# Patient Record
Sex: Female | Born: 1960 | Race: White | Hispanic: No | Marital: Married | Smoking: Former smoker
Health system: Southern US, Community
[De-identification: ages and names within clinical notes are randomized; demographics above are authoritative.]

## PROBLEM LIST (undated history)

## (undated) DIAGNOSIS — G4733 Obstructive sleep apnea (adult) (pediatric): Secondary | ICD-10-CM

## (undated) DIAGNOSIS — K219 Gastro-esophageal reflux disease without esophagitis: Secondary | ICD-10-CM

## (undated) DIAGNOSIS — M4800 Spinal stenosis, site unspecified: Secondary | ICD-10-CM

## (undated) DIAGNOSIS — N200 Calculus of kidney: Secondary | ICD-10-CM

## (undated) DIAGNOSIS — M47812 Spondylosis without myelopathy or radiculopathy, cervical region: Secondary | ICD-10-CM

## (undated) DIAGNOSIS — L4050 Arthropathic psoriasis, unspecified: Secondary | ICD-10-CM

## (undated) DIAGNOSIS — R112 Nausea with vomiting, unspecified: Secondary | ICD-10-CM

## (undated) DIAGNOSIS — F329 Major depressive disorder, single episode, unspecified: Secondary | ICD-10-CM

## (undated) DIAGNOSIS — F32A Depression, unspecified: Secondary | ICD-10-CM

## (undated) DIAGNOSIS — E785 Hyperlipidemia, unspecified: Secondary | ICD-10-CM

## (undated) DIAGNOSIS — D45 Polycythemia vera: Secondary | ICD-10-CM

## (undated) DIAGNOSIS — R519 Headache, unspecified: Secondary | ICD-10-CM

## (undated) DIAGNOSIS — F419 Anxiety disorder, unspecified: Secondary | ICD-10-CM

## (undated) DIAGNOSIS — K589 Irritable bowel syndrome without diarrhea: Secondary | ICD-10-CM

## (undated) DIAGNOSIS — N201 Calculus of ureter: Secondary | ICD-10-CM

## (undated) DIAGNOSIS — Z9889 Other specified postprocedural states: Secondary | ICD-10-CM

## (undated) DIAGNOSIS — R51 Headache: Secondary | ICD-10-CM

## (undated) DIAGNOSIS — Z87442 Personal history of urinary calculi: Secondary | ICD-10-CM

## (undated) DIAGNOSIS — M1990 Unspecified osteoarthritis, unspecified site: Secondary | ICD-10-CM

## (undated) HISTORY — PX: ESOPHAGOGASTRODUODENOSCOPY: SHX1529

## (undated) HISTORY — PX: ABLATION: SHX5711

## (undated) HISTORY — PX: COLONOSCOPY: SHX174

## (undated) HISTORY — PX: CHOLECYSTECTOMY: SHX55

## (undated) HISTORY — PX: OTHER SURGICAL HISTORY: SHX169

## (undated) HISTORY — PX: WISDOM TOOTH EXTRACTION: SHX21

## (undated) HISTORY — DX: Irritable bowel syndrome, unspecified: K58.9

## (undated) HISTORY — DX: Headache, unspecified: R51.9

## (undated) HISTORY — DX: Headache: R51

---

## 2008-06-27 ENCOUNTER — Encounter: Admission: RE | Admit: 2008-06-27 | Discharge: 2008-06-27 | Payer: Self-pay | Admitting: Internal Medicine

## 2008-07-02 ENCOUNTER — Encounter: Admission: RE | Admit: 2008-07-02 | Discharge: 2008-07-02 | Payer: Self-pay | Admitting: Internal Medicine

## 2009-08-18 ENCOUNTER — Encounter: Admission: RE | Admit: 2009-08-18 | Discharge: 2009-08-18 | Payer: Self-pay | Admitting: Unknown Physician Specialty

## 2010-02-10 ENCOUNTER — Emergency Department (HOSPITAL_COMMUNITY)
Admission: EM | Admit: 2010-02-10 | Discharge: 2010-02-10 | Payer: Self-pay | Source: Home / Self Care | Admitting: Emergency Medicine

## 2010-02-14 ENCOUNTER — Ambulatory Visit (HOSPITAL_COMMUNITY)
Admission: RE | Admit: 2010-02-14 | Discharge: 2010-02-14 | Payer: Self-pay | Source: Home / Self Care | Admitting: Urology

## 2010-02-14 HISTORY — PX: EXTRACORPOREAL SHOCK WAVE LITHOTRIPSY: SHX1557

## 2010-04-04 ENCOUNTER — Encounter (HOSPITAL_COMMUNITY): Payer: Self-pay | Admitting: Obstetrics and Gynecology

## 2010-05-24 LAB — URINALYSIS, ROUTINE W REFLEX MICROSCOPIC
Bilirubin Urine: NEGATIVE
Glucose, UA: NEGATIVE mg/dL
Ketones, ur: NEGATIVE mg/dL
Leukocytes, UA: NEGATIVE
Nitrite: NEGATIVE
Protein, ur: NEGATIVE mg/dL
Specific Gravity, Urine: 1.015 (ref 1.005–1.030)
Urobilinogen, UA: 0.2 mg/dL (ref 0.0–1.0)
pH: 7 (ref 5.0–8.0)

## 2010-05-24 LAB — URINE MICROSCOPIC-ADD ON

## 2010-05-24 LAB — DIFFERENTIAL
Basophils Absolute: 0.1 10*3/uL (ref 0.0–0.1)
Basophils Relative: 1 % (ref 0–1)
Eosinophils Absolute: 0.2 10*3/uL (ref 0.0–0.7)
Eosinophils Relative: 2 % (ref 0–5)
Lymphocytes Relative: 25 % (ref 12–46)
Lymphs Abs: 3.2 10*3/uL (ref 0.7–4.0)
Monocytes Absolute: 0.9 10*3/uL (ref 0.1–1.0)
Monocytes Relative: 7 % (ref 3–12)
Neutro Abs: 8.5 10*3/uL — ABNORMAL HIGH (ref 1.7–7.7)
Neutrophils Relative %: 66 % (ref 43–77)

## 2010-05-24 LAB — POCT I-STAT, CHEM 8
BUN: 16 mg/dL (ref 6–23)
Calcium, Ion: 1.18 mmol/L (ref 1.12–1.32)
Chloride: 103 mEq/L (ref 96–112)
Creatinine, Ser: 1 mg/dL (ref 0.4–1.2)
Glucose, Bld: 104 mg/dL — ABNORMAL HIGH (ref 70–99)
HCT: 44 % (ref 36.0–46.0)
Hemoglobin: 15 g/dL (ref 12.0–15.0)
Potassium: 3.7 mEq/L (ref 3.5–5.1)
Sodium: 139 mEq/L (ref 135–145)
TCO2: 27 mmol/L (ref 0–100)

## 2010-05-24 LAB — CBC
HCT: 42.7 % (ref 36.0–46.0)
Hemoglobin: 15 g/dL (ref 12.0–15.0)
MCH: 31.3 pg (ref 26.0–34.0)
MCHC: 35.1 g/dL (ref 30.0–36.0)
MCV: 89.1 fL (ref 78.0–100.0)
Platelets: 253 10*3/uL (ref 150–400)
RBC: 4.79 MIL/uL (ref 3.87–5.11)
RDW: 12.9 % (ref 11.5–15.5)
WBC: 12.9 10*3/uL — ABNORMAL HIGH (ref 4.0–10.5)

## 2010-05-24 LAB — POCT PREGNANCY, URINE: Preg Test, Ur: NEGATIVE

## 2010-10-16 ENCOUNTER — Emergency Department (HOSPITAL_COMMUNITY)
Admission: EM | Admit: 2010-10-16 | Discharge: 2010-10-16 | Disposition: A | Discharge status: Home / Self Care | Payer: 59 | Attending: Emergency Medicine | Admitting: Emergency Medicine

## 2010-10-16 DIAGNOSIS — M542 Cervicalgia: Secondary | ICD-10-CM | POA: Insufficient documentation

## 2010-10-16 DIAGNOSIS — K219 Gastro-esophageal reflux disease without esophagitis: Secondary | ICD-10-CM | POA: Insufficient documentation

## 2010-10-16 DIAGNOSIS — G8929 Other chronic pain: Secondary | ICD-10-CM | POA: Insufficient documentation

## 2010-10-16 DIAGNOSIS — R1110 Vomiting, unspecified: Secondary | ICD-10-CM | POA: Insufficient documentation

## 2010-10-16 DIAGNOSIS — M129 Arthropathy, unspecified: Secondary | ICD-10-CM | POA: Insufficient documentation

## 2010-10-16 DIAGNOSIS — M545 Low back pain, unspecified: Secondary | ICD-10-CM | POA: Insufficient documentation

## 2010-10-16 DIAGNOSIS — R51 Headache: Secondary | ICD-10-CM | POA: Insufficient documentation

## 2010-10-16 DIAGNOSIS — Z79899 Other long term (current) drug therapy: Secondary | ICD-10-CM | POA: Insufficient documentation

## 2010-10-16 DIAGNOSIS — F341 Dysthymic disorder: Secondary | ICD-10-CM | POA: Insufficient documentation

## 2010-10-16 LAB — URINALYSIS, ROUTINE W REFLEX MICROSCOPIC
Bilirubin Urine: NEGATIVE
Glucose, UA: NEGATIVE mg/dL
Hgb urine dipstick: NEGATIVE
Ketones, ur: NEGATIVE mg/dL
Leukocytes, UA: NEGATIVE
Nitrite: NEGATIVE
Protein, ur: NEGATIVE mg/dL
Specific Gravity, Urine: 1.01 (ref 1.005–1.030)
Urobilinogen, UA: 0.2 mg/dL (ref 0.0–1.0)
pH: 7.5 (ref 5.0–8.0)

## 2010-10-16 LAB — COMPREHENSIVE METABOLIC PANEL
ALT: 17 U/L (ref 0–35)
AST: 19 U/L (ref 0–37)
Albumin: 3.5 g/dL (ref 3.5–5.2)
Alkaline Phosphatase: 77 U/L (ref 39–117)
BUN: 11 mg/dL (ref 6–23)
CO2: 27 mEq/L (ref 19–32)
Calcium: 9.4 mg/dL (ref 8.4–10.5)
Chloride: 105 mEq/L (ref 96–112)
Creatinine, Ser: 0.54 mg/dL (ref 0.50–1.10)
GFR calc Af Amer: >60 mL/min (ref >60)
GFR calc non Af Amer: >60 mL/min (ref >60)
Glucose, Bld: 110 mg/dL — ABNORMAL HIGH (ref 70–99)
Potassium: 4.2 mEq/L (ref 3.5–5.1)
Sodium: 139 mEq/L (ref 135–145)
Total Bilirubin: 0.3 mg/dL (ref 0.3–1.2)
Total Protein: 7.8 g/dL (ref 6.0–8.3)

## 2010-10-16 LAB — CBC
HCT: 43 % (ref 36.0–46.0)
Hemoglobin: 15.4 g/dL — ABNORMAL HIGH (ref 12.0–15.0)
MCH: 31.3 pg (ref 26.0–34.0)
MCHC: 35.8 g/dL (ref 30.0–36.0)
MCV: 87.4 fL (ref 78.0–100.0)
Platelets: 399 10*3/uL (ref 150–400)
RBC: 4.92 MIL/uL (ref 3.87–5.11)
RDW: 12.2 % (ref 11.5–15.5)
WBC: 9.6 10*3/uL (ref 4.0–10.5)

## 2010-10-16 LAB — DIFFERENTIAL
Basophils Absolute: 0 10*3/uL (ref 0.0–0.1)
Basophils Relative: 0 % (ref 0–1)
Eosinophils Absolute: 0 10*3/uL (ref 0.0–0.7)
Eosinophils Relative: 0 % (ref 0–5)
Lymphocytes Relative: 14 % (ref 12–46)
Lymphs Abs: 1.4 10*3/uL (ref 0.7–4.0)
Monocytes Absolute: 0.3 10*3/uL (ref 0.1–1.0)
Monocytes Relative: 3 % (ref 3–12)
Neutro Abs: 7.9 10*3/uL — ABNORMAL HIGH (ref 1.7–7.7)
Neutrophils Relative %: 83 % — ABNORMAL HIGH (ref 43–77)

## 2010-10-16 LAB — POCT PREGNANCY, URINE: Preg Test, Ur: NEGATIVE

## 2012-06-05 ENCOUNTER — Other Ambulatory Visit: Payer: Self-pay | Admitting: *Deleted

## 2012-06-05 DIAGNOSIS — G4733 Obstructive sleep apnea (adult) (pediatric): Secondary | ICD-10-CM

## 2012-06-24 ENCOUNTER — Ambulatory Visit (INDEPENDENT_AMBULATORY_CARE_PROVIDER_SITE_OTHER): Payer: 59 | Admitting: Neurology

## 2012-06-24 DIAGNOSIS — R0609 Other forms of dyspnea: Secondary | ICD-10-CM

## 2012-06-24 DIAGNOSIS — R0989 Other specified symptoms and signs involving the circulatory and respiratory systems: Secondary | ICD-10-CM

## 2012-06-24 DIAGNOSIS — G4710 Hypersomnia, unspecified: Secondary | ICD-10-CM

## 2012-06-24 DIAGNOSIS — G4733 Obstructive sleep apnea (adult) (pediatric): Secondary | ICD-10-CM

## 2012-07-09 ENCOUNTER — Other Ambulatory Visit: Payer: Self-pay | Admitting: *Deleted

## 2012-07-09 ENCOUNTER — Telehealth: Payer: Self-pay | Admitting: *Deleted

## 2012-07-09 DIAGNOSIS — G4733 Obstructive sleep apnea (adult) (pediatric): Secondary | ICD-10-CM

## 2012-07-09 NOTE — Telephone Encounter (Signed)
Tried to contact pt to go over sleep study results.  Home phone said (all circuits busy) and then call could not be completed as dialed,  Tried mobile number and also received a call could not be completed as dialed message. -sh

## 2012-11-04 ENCOUNTER — Encounter (HOSPITAL_COMMUNITY): Payer: Self-pay | Admitting: Obstetrics and Gynecology

## 2012-11-12 ENCOUNTER — Encounter (HOSPITAL_COMMUNITY): Payer: Self-pay | Admitting: Pharmacist

## 2012-11-26 MED ORDER — DEXTROSE 5 % IV SOLN
2.0000 g | INTRAVENOUS | Status: AC
  Administered 2012-11-27: 2 g via INTRAVENOUS
  Filled 2012-11-26: qty 2

## 2012-11-26 MED ORDER — DEXTROSE 5 % IV SOLN
2.0000 g | INTRAVENOUS | Status: DC
  Filled 2012-11-26: qty 2

## 2012-11-26 NOTE — H&P (Addendum)
52 yo G2P2 with PMB and cervical stenosis presents for diagnostic procedure  PMHx:  GERD, IBS, arthritis, anxiety,  PSHx:  c-section x 2, chole, lithotripsy All:  None Meds:  Prilosec, celexa, cymbalta, vitamins, clonazepam SHx:  Negative tobacco  Af, vss Gen - NAD abd - soft, NT CV - RRR Lungs - clear Ext - NT PV - uterus mobile NT  [place]:  Fluid in endometrial canal, no adnexal masses  A/P:  PMB, unable to perform office EMBx because of cervical stenosis Hysteroscopy, D&C No change to plan of care - reviewed with pt and husband Informed consent

## 2012-11-27 ENCOUNTER — Ambulatory Visit (HOSPITAL_COMMUNITY)
Admission: RE | Admit: 2012-11-27 | Discharge: 2012-11-27 | Disposition: A | Discharge status: Home / Self Care | Payer: 59 | Source: Ambulatory Visit | Attending: Obstetrics and Gynecology | Admitting: Obstetrics and Gynecology

## 2012-11-27 ENCOUNTER — Encounter (HOSPITAL_COMMUNITY): Payer: Self-pay | Admitting: *Deleted

## 2012-11-27 ENCOUNTER — Encounter (HOSPITAL_COMMUNITY): Payer: Self-pay | Admitting: Anesthesiology

## 2012-11-27 ENCOUNTER — Encounter (HOSPITAL_COMMUNITY)
Admission: RE | Disposition: A | Discharge status: Home / Self Care | Payer: Self-pay | Source: Ambulatory Visit | Attending: Obstetrics and Gynecology

## 2012-11-27 ENCOUNTER — Ambulatory Visit (HOSPITAL_COMMUNITY): Payer: 59 | Admitting: Anesthesiology

## 2012-11-27 DIAGNOSIS — N882 Stricture and stenosis of cervix uteri: Secondary | ICD-10-CM | POA: Insufficient documentation

## 2012-11-27 DIAGNOSIS — N857 Hematometra: Secondary | ICD-10-CM | POA: Insufficient documentation

## 2012-11-27 DIAGNOSIS — N950 Postmenopausal bleeding: Secondary | ICD-10-CM | POA: Insufficient documentation

## 2012-11-27 HISTORY — DX: Nausea with vomiting, unspecified: R11.2

## 2012-11-27 HISTORY — DX: Other specified postprocedural states: Z98.890

## 2012-11-27 HISTORY — PX: HYSTEROSCOPY WITH D & C: SHX1775

## 2012-11-27 HISTORY — DX: Depression, unspecified: F32.A

## 2012-11-27 HISTORY — DX: Major depressive disorder, single episode, unspecified: F32.9

## 2012-11-27 HISTORY — DX: Unspecified osteoarthritis, unspecified site: M19.90

## 2012-11-27 HISTORY — DX: Anxiety disorder, unspecified: F41.9

## 2012-11-27 LAB — CBC
HCT: 44.1 % (ref 36.0–46.0)
Hemoglobin: 15.1 g/dL — ABNORMAL HIGH (ref 12.0–15.0)
MCH: 30.4 pg (ref 26.0–34.0)
MCHC: 34.2 g/dL (ref 30.0–36.0)
MCV: 88.9 fL (ref 78.0–100.0)
Platelets: 265 10*3/uL (ref 150–400)
RBC: 4.96 MIL/uL (ref 3.87–5.11)
RDW: 12.9 % (ref 11.5–15.5)
WBC: 7.7 10*3/uL (ref 4.0–10.5)

## 2012-11-27 SURGERY — DILATATION AND CURETTAGE /HYSTEROSCOPY
Anesthesia: General | Site: Vagina | Wound class: Clean Contaminated

## 2012-11-27 MED ORDER — PROPOFOL 10 MG/ML IV BOLUS
INTRAVENOUS | Status: DC | PRN
  Administered 2012-11-27: 180 mg via INTRAVENOUS

## 2012-11-27 MED ORDER — LIDOCAINE HCL 1 % IJ SOLN
INTRAMUSCULAR | Status: DC | PRN
  Administered 2012-11-27: 9 mL
  Administered 2012-11-27: 1 mL

## 2012-11-27 MED ORDER — LIDOCAINE HCL (CARDIAC) 20 MG/ML IV SOLN
INTRAVENOUS | Status: AC
  Filled 2012-11-27: qty 5

## 2012-11-27 MED ORDER — MIDAZOLAM HCL 2 MG/2ML IJ SOLN: 0.5000 mg | Freq: Once | INTRAMUSCULAR | Status: DC | PRN

## 2012-11-27 MED ORDER — SCOPOLAMINE 1 MG/3DAYS TD PT72
MEDICATED_PATCH | TRANSDERMAL | Status: AC
  Filled 2012-11-27: qty 1

## 2012-11-27 MED ORDER — PROPOFOL 10 MG/ML IV EMUL
INTRAVENOUS | Status: AC
  Filled 2012-11-27: qty 20

## 2012-11-27 MED ORDER — LIDOCAINE HCL (CARDIAC) 20 MG/ML IV SOLN
INTRAVENOUS | Status: DC | PRN
  Administered 2012-11-27: 50 mg via INTRAVENOUS

## 2012-11-27 MED ORDER — FENTANYL CITRATE 0.05 MG/ML IJ SOLN
INTRAMUSCULAR | Status: DC | PRN
  Administered 2012-11-27: 100 ug via INTRAVENOUS

## 2012-11-27 MED ORDER — ONDANSETRON HCL 4 MG/2ML IJ SOLN
INTRAMUSCULAR | Status: DC | PRN
  Administered 2012-11-27: 4 mg via INTRAVENOUS

## 2012-11-27 MED ORDER — ONDANSETRON HCL 4 MG/2ML IJ SOLN
INTRAMUSCULAR | Status: AC
  Filled 2012-11-27: qty 2

## 2012-11-27 MED ORDER — MIDAZOLAM HCL 2 MG/2ML IJ SOLN
INTRAMUSCULAR | Status: AC
  Filled 2012-11-27: qty 2

## 2012-11-27 MED ORDER — DEXAMETHASONE SODIUM PHOSPHATE 10 MG/ML IJ SOLN
INTRAMUSCULAR | Status: DC | PRN
  Administered 2012-11-27: 10 mg via INTRAVENOUS

## 2012-11-27 MED ORDER — MEPERIDINE HCL 25 MG/ML IJ SOLN: 6.2500 mg | INTRAMUSCULAR | Status: DC | PRN

## 2012-11-27 MED ORDER — LACTATED RINGERS IV SOLN
INTRAVENOUS | Status: DC
  Administered 2012-11-27 (×3): via INTRAVENOUS

## 2012-11-27 MED ORDER — PANTOPRAZOLE SODIUM 40 MG PO TBEC
40.0000 mg | DELAYED_RELEASE_TABLET | Freq: Every day | ORAL | Status: DC
  Administered 2012-11-27: 40 mg via ORAL

## 2012-11-27 MED ORDER — PROMETHAZINE HCL 25 MG/ML IJ SOLN: 6.2500 mg | INTRAMUSCULAR | Status: DC | PRN

## 2012-11-27 MED ORDER — KETOROLAC TROMETHAMINE 30 MG/ML IJ SOLN: 15.0000 mg | Freq: Once | INTRAMUSCULAR | Status: DC | PRN

## 2012-11-27 MED ORDER — FENTANYL CITRATE 0.05 MG/ML IJ SOLN
INTRAMUSCULAR | Status: AC
  Filled 2012-11-27: qty 2

## 2012-11-27 MED ORDER — SCOPOLAMINE 1 MG/3DAYS TD PT72
1.0000 | MEDICATED_PATCH | TRANSDERMAL | Status: DC
  Administered 2012-11-27: 1.5 mg via TRANSDERMAL

## 2012-11-27 MED ORDER — PANTOPRAZOLE SODIUM 40 MG PO TBEC
DELAYED_RELEASE_TABLET | ORAL | Status: AC
  Administered 2012-11-27: 40 mg via ORAL
  Filled 2012-11-27: qty 1

## 2012-11-27 MED ORDER — FENTANYL CITRATE 0.05 MG/ML IJ SOLN
25.0000 ug | INTRAMUSCULAR | Status: DC | PRN
  Administered 2012-11-27: 50 ug via INTRAVENOUS

## 2012-11-27 MED ORDER — MIDAZOLAM HCL 2 MG/2ML IJ SOLN
INTRAMUSCULAR | Status: DC | PRN
  Administered 2012-11-27: 2 mg via INTRAVENOUS

## 2012-11-27 MED ORDER — GLYCINE 1.5 % IR SOLN
Status: DC | PRN
  Administered 2012-11-27 (×2): 3000 mL

## 2012-11-27 MED ORDER — KETOROLAC TROMETHAMINE 30 MG/ML IJ SOLN
INTRAMUSCULAR | Status: DC | PRN
  Administered 2012-11-27: 30 mg via INTRAVENOUS

## 2012-11-27 MED ORDER — FENTANYL CITRATE 0.05 MG/ML IJ SOLN
INTRAMUSCULAR | Status: AC
  Administered 2012-11-27: 50 ug via INTRAVENOUS
  Filled 2012-11-27: qty 2

## 2012-11-27 SURGICAL SUPPLY — 19 items
ABLATOR ENDOMETRIAL BIPOLAR (ABLATOR) IMPLANT
CANISTER SUCTION 2500CC (MISCELLANEOUS) ×2 IMPLANT
CATH ROBINSON RED A/P 16FR (CATHETERS) ×2 IMPLANT
CATH THERMACHOICE III (CATHETERS) IMPLANT
CLOTH BEACON ORANGE TIMEOUT ST (SAFETY) ×2 IMPLANT
CONTAINER PREFILL 10% NBF 60ML (FORM) ×2 IMPLANT
DILATOR CANAL MILEX (MISCELLANEOUS) ×2 IMPLANT
DRESSING TELFA 8X3 (GAUZE/BANDAGES/DRESSINGS) ×2 IMPLANT
ELECT REM PT RETURN 9FT ADLT (ELECTROSURGICAL) ×2
ELECTRODE REM PT RTRN 9FT ADLT (ELECTROSURGICAL) ×1 IMPLANT
GLOVE BIO SURGEON STRL SZ 6.5 (GLOVE) ×2 IMPLANT
GLOVE BIOGEL PI IND STRL 7.0 (GLOVE) ×1 IMPLANT
GLOVE BIOGEL PI INDICATOR 7.0 (GLOVE) ×1
GOWN STRL REIN XL XLG (GOWN DISPOSABLE) ×4 IMPLANT
LOOP ANGLED CUTTING 22FR (CUTTING LOOP) IMPLANT
PACK HYSTEROSCOPY LF (CUSTOM PROCEDURE TRAY) ×2 IMPLANT
PAD OB MATERNITY 4.3X12.25 (PERSONAL CARE ITEMS) ×2 IMPLANT
TOWEL OR 17X24 6PK STRL BLUE (TOWEL DISPOSABLE) ×4 IMPLANT
WATER STERILE IRR 1000ML POUR (IV SOLUTION) ×2 IMPLANT

## 2012-11-27 NOTE — Anesthesia Preprocedure Evaluation (Signed)
Anesthesia Evaluation  Patient identified by MRN, date of birth, ID band Patient awake    Reviewed: Allergy & Precautions, H&P , Patient's Chart, lab work & pertinent test results, reviewed documented beta blocker date and time   History of Anesthesia Complications (+) PONV  Airway Mallampati: II TM Distance: >3 FB Neck ROM: full    Dental no notable dental hx.    Pulmonary neg pulmonary ROS, sleep apnea ,  breath sounds clear to auscultation  Pulmonary exam normal       Cardiovascular Exercise Tolerance: Good negative cardio ROS  Rhythm:regular Rate:Normal     Neuro/Psych PSYCHIATRIC DISORDERS Anxiety Depression negative neurological ROS  negative psych ROS   GI/Hepatic negative GI ROS, Neg liver ROS,   Endo/Other  negative endocrine ROS  Renal/GU Renal diseasenegative Renal ROS     Musculoskeletal   Abdominal   Peds  Hematology negative hematology ROS (+)   Anesthesia Other Findings Kidney stones arthritis  Reproductive/Obstetrics negative OB ROS                           Anesthesia Physical Anesthesia Plan  ASA: II  Anesthesia Plan: General LMA   Post-op Pain Management:    Induction:   Airway Management Planned:   Additional Equipment:   Intra-op Plan:   Post-operative Plan:   Informed Consent: I have reviewed the patients History and Physical, chart, labs and discussed the procedure including the risks, benefits and alternatives for the proposed anesthesia with the patient or authorized representative who has indicated his/her understanding and acceptance.   Dental Advisory Given  Plan Discussed with: CRNA, Surgeon and Anesthesiologist  Anesthesia Plan Comments:         Anesthesia Quick Evaluation

## 2012-11-27 NOTE — Anesthesia Postprocedure Evaluation (Signed)
  Anesthesia Post-op Note  Anesthesia Post Note  Patient: Caitlin Moran  Procedure(s) Performed: Procedure(s) (LRB): DILATATION AND CURETTAGE /HYSTEROSCOPY (N/A)  Anesthesia type: General  Patient location: PACU  Post pain: Pain level controlled  Post assessment: Post-op Vital signs reviewed  Last Vitals:  Filed Vitals:   11/27/12 1400  BP: 130/61  Pulse: 71  Temp:   Resp: 16    Post vital signs: Reviewed  Level of consciousness: sedated  Complications: No apparent anesthesia complications

## 2012-11-27 NOTE — Transfer of Care (Signed)
Immediate Anesthesia Transfer of Care Note  Patient: Caitlin Moran  Procedure(s) Performed: Procedure(s): DILATATION AND CURETTAGE /HYSTEROSCOPY (N/A)  Patient Location: PACU  Anesthesia Type:General  Level of Consciousness: awake  Airway & Oxygen Therapy: Patient Spontanous Breathing  Post-op Assessment: Report given to PACU RN  Post vital signs: stable  Filed Vitals:   11/27/12 1154  BP: 153/93  Pulse: 74  Temp: 36.8 C  Resp: 18    Complications: No apparent anesthesia complications

## 2012-11-28 ENCOUNTER — Encounter (HOSPITAL_COMMUNITY): Payer: Self-pay | Admitting: Obstetrics and Gynecology

## 2012-11-28 NOTE — Op Note (Signed)
Caitlin Moran, Caitlin Moran                 ACCOUNT NO.:  [id number]  MEDICAL RECORD NO.:  [id number]  LOCATION:  [facility]                          FACILITY:  [facility]  PHYSICIAN:  Zelphia Cairo, MD    DATE OF BIRTH:  07-06-1960  DATE OF PROCEDURE: DATE OF DISCHARGE:  11/27/2012                              OPERATIVE REPORT   PREOPERATIVE DIAGNOSES: 1. Irregular postmenopausal bleeding. 2. Cervical stenosis.  POSTOPERATIVE DIAGNOSES: 1. Irregular postmenopausal bleeding. 2. Cervical stenosis, pathology pending. 3. Suspect hematometra.  SURGEON:  Zelphia Cairo, MD  PROCEDURES: 1. Cervical block. 2. Dilation and curettage. 3. Diagnostic hysteroscopy.  ANESTHESIA:  General, Dr. Brayton Caves.  BLOOD LOSS:  Minimal.  COMPLICATIONS:  None.  SPECIMEN:  Endometrial curettings.  CONDITION:  Stable and extubated to recovery room.  DESCRIPTION OF PROCEDURE:  The patient was taken to the operating room after informed consent was obtained.  She was given general anesthesia, placed in the dorsal lithotomy position using Allen stirrups.  She was prepped and draped in sterile fashion and an in-and-out catheter was used to drain her bladder for approximately 75 mL of clear urine. Bivalve speculum was placed in the vagina and a single-tooth tenaculum was placed on the anterior lip of the cervix.  The cervix was gently dilated using very small Pratt dilators.  Once the cervix was dilated and stenosis was relieved, copious amounts of dark blood and mucus were released from the cervix.  After the endometrium was drained, the diagnostic hysteroscope was inserted and a survey was performed.  No polyps or masses were identified within the endometrial cavity.  The hysteroscope was removed and a gentle curetting was performed throughout the endometrium.  Specimen was placed on Telfa and passed off to be sent to Pathology. Lidocaine 1% was then used to perform a cervical block to provide  some postoperative anesthesia.  Tenaculum was removed from the cervix.  The cervix was hemostatic.  Speculum was removed.  The patient was taken to the recovery room in stable condition.  Sponge, lap, needle, and instrument counts were correct x2.     Zelphia Cairo, MD     GA/MEDQ  D:  11/27/2012  T:  11/28/2012  Job:  [id number]

## 2013-01-11 ENCOUNTER — Emergency Department (HOSPITAL_COMMUNITY)
Admission: EM | Admit: 2013-01-11 | Discharge: 2013-01-11 | Disposition: A | Discharge status: Home / Self Care | Payer: 59 | Source: Home / Self Care | Attending: Emergency Medicine | Admitting: Emergency Medicine

## 2013-01-11 ENCOUNTER — Emergency Department (INDEPENDENT_AMBULATORY_CARE_PROVIDER_SITE_OTHER): Payer: 59

## 2013-01-11 ENCOUNTER — Encounter (HOSPITAL_COMMUNITY): Payer: Self-pay | Admitting: Emergency Medicine

## 2013-01-11 DIAGNOSIS — S4292XA Fracture of left shoulder girdle, part unspecified, initial encounter for closed fracture: Secondary | ICD-10-CM

## 2013-01-11 DIAGNOSIS — S42209A Unspecified fracture of upper end of unspecified humerus, initial encounter for closed fracture: Secondary | ICD-10-CM

## 2013-01-11 MED ORDER — HYDROMORPHONE HCL PF 1 MG/ML IJ SOLN
2.0000 mg | Freq: Once | INTRAMUSCULAR | Status: AC
  Administered 2013-01-11: 2 mg via INTRAMUSCULAR

## 2013-01-11 MED ORDER — ONDANSETRON 4 MG PO TBDP
ORAL_TABLET | ORAL | Status: AC
  Filled 2013-01-11: qty 1

## 2013-01-11 MED ORDER — ONDANSETRON HCL 4 MG/2ML IJ SOLN: 4.0000 mg | Freq: Once | INTRAMUSCULAR | Status: DC

## 2013-01-11 MED ORDER — HYDROMORPHONE HCL PF 1 MG/ML IJ SOLN
INTRAMUSCULAR | Status: AC
  Filled 2013-01-11: qty 2

## 2013-01-11 MED ORDER — ONDANSETRON 4 MG PO TBDP
4.0000 mg | ORAL_TABLET | Freq: Once | ORAL | Status: AC
  Administered 2013-01-11: 4 mg via ORAL

## 2013-01-11 MED ORDER — OXYCODONE-ACETAMINOPHEN 5-325 MG PO TABS: ORAL_TABLET | ORAL | Status: DC

## 2013-01-11 NOTE — ED Notes (Signed)
Pt  Caitlin Moran  In the  Kitchen        Pain  l  Shoulder      rom in the  Affected  Shoulder  Is  Limited

## 2013-01-11 NOTE — ED Provider Notes (Signed)
Chief Complaint:   Chief Complaint  Patient presents with  . 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 She denies any injury to the head or loss of consciousness. There no other obvious injuries. She denies any pain in the elbow wrist or hand. The pain is confined to the shoulder and as a 7/10 in intensity. The shoulder has a very limited range of motion with pain. She denies any numbness or tingling in the arm or muscle weakness.  Review of Systems:  Other than noted above, the patient denies any of the following symptoms: Systemic:  No fevers, chills, sweats, or aches.  No fatigue or tiredness. Musculoskeletal:  No joint pain, arthritis, bursitis, swelling, back pain, or neck pain. Neurological:  No muscular weakness, paresthesias, headache, or trouble with speech or coordination.  No dizziness.  PMFSH:  Past medical history, family history, social history, meds, and allergies were reviewed.  She has no medication allergies, she takes Celebrex, cyclobenzaprine, omeprazole, hydrocodone, Cymbalta, and Simponi. She has a history of psoriasis and spinal stenosis.  Physical Exam:   Vital signs:  BP 124/72  Pulse 72  Temp(Src) 98.6 F (37 C) (Oral)  Resp 18  SpO2 100% Gen:  Alert and oriented times 3.  In no distress. Musculoskeletal: There is pain to palpation over the lateral aspect of the proximal humerus. No swelling, bruising, or deformity. The shoulder has a very limited range of motion with pain in all directions.  Otherwise, all joints had a full a ROM with no swelling, bruising or deformity.  No edema, pulses full. Extremities were warm and pink.  Capillary refill was brisk.  Skin:  Clear, warm and dry.  No rash. Neuro:  Alert and oriented times 3.  Muscle strength was normal.  Sensation was intact to light  touch.   Radiology:  Dg Shoulder Left  01/11/2013   CLINICAL DATA:  Fall. Left shoulder injury and pain.  EXAM: LEFT SHOULDER - 2+ VIEW  COMPARISON:  None.  FINDINGS: Comminuted fracture is seen involving the greater tuberosity of the humeral head. In addition, there is a bone lesion with chondrous calcification in the left humeral neck, which shows no definite aggressive radiographic features.  No other fractures are identified. No evidence of dislocation.  IMPRESSION: Comminuted fracture involving the greater tuberosity of the humeral head.  Low-grade chondrous bone lesion in the left humeral neck.   Electronically Signed   By: Myles Rosenthal M.D.   On: 01/11/2013 13:02     I reviewed the images independently and personally and concur with the radiologist's findings.  Course in Urgent Care Center:   Given Dilaudid 2 mg IM and Zofran 4 mg sublingually. She was placed in a sling. Dr. Quintella Reichert was called, he recommended followup in his office early next week.  Assessment:  The encounter diagnosis was Shoulder fracture, left, closed, initial encounter.  Plan:   1.  Meds:  The following meds were prescribed:   New Prescriptions   OXYCODONE-ACETAMINOPHEN (PERCOCET) 5-325 MG PER TABLET    1 to 2 tablets every 6 hours as needed for pain.    2.  Patient Education/Counseling:  The patient was given appropriate handouts, self care instructions, and instructed in symptomatic relief, including rest and activity, elevation, application of ice and compression.  She'll wear the sling continuously.  3.  Follow up:  The patient was told to follow up if no better in 3 to 4 days, if becoming worse in any way, and given some red flag symptoms such as numbness or discoloration of the hand which would prompt immediate return.  Follow up with Dr. Quintella Reichert next week.     Reuben Likes, MD 01/11/13 [id number]

## 2013-01-28 DIAGNOSIS — S42209A Unspecified fracture of upper end of unspecified humerus, initial encounter for closed fracture: Secondary | ICD-10-CM | POA: Insufficient documentation

## 2013-01-28 DIAGNOSIS — M25512 Pain in left shoulder: Secondary | ICD-10-CM | POA: Insufficient documentation

## 2013-10-28 ENCOUNTER — Other Ambulatory Visit: Payer: Self-pay | Admitting: Obstetrics and Gynecology

## 2013-10-29 LAB — CYTOLOGY - PAP

## 2014-02-07 ENCOUNTER — Emergency Department (HOSPITAL_COMMUNITY)
Admission: EM | Admit: 2014-02-07 | Discharge: 2014-02-07 | Disposition: A | Discharge status: Home / Self Care | Payer: 59 | Source: Home / Self Care | Attending: Family Medicine | Admitting: Family Medicine

## 2014-02-07 ENCOUNTER — Encounter (HOSPITAL_COMMUNITY): Payer: Self-pay | Admitting: *Deleted

## 2014-02-07 DIAGNOSIS — S61219A Laceration without foreign body of unspecified finger without damage to nail, initial encounter: Secondary | ICD-10-CM

## 2014-02-07 NOTE — Discharge Instructions (Signed)
Keep clean and dry for 1 day, use bacitracin ointment. Return as needed.

## 2014-02-07 NOTE — ED Provider Notes (Signed)
CSN: [id number]     Arrival date & time 02/07/14  1322 History   First MD Initiated Contact with Patient 02/07/14 1337     Chief Complaint  Patient presents with  . Laceration   (Consider location/radiation/quality/duration/timing/severity/associated sxs/prior Treatment) Patient is a 53 y.o. female presenting with skin laceration. The history is provided by the patient.  Laceration Location:  Finger Finger laceration location:  R index finger Length (cm):  1 Depth:  Cutaneous Quality: straight   Bleeding: controlled   Time since incident:  2 days Laceration mechanism:  Knife Pain details:    Quality:  Sharp   Severity:  Mild   Progression:  Unchanged Foreign body present:  No foreign bodies Tetanus status:  Up to date   Past Medical History  Diagnosis Date  . PONV (postoperative nausea and vomiting)   . Anxiety   . Depression   . Sleep apnea     need to get CPAP  . Chronic kidney disease     lithotripsy  . Headache(784.0)     migraines  . Arthritis    Past Surgical History  Procedure Laterality Date  . Lithotripsy    . Cesarean section    . Ablation      unquestionable  . Cholecystectomy    . Wisdom tooth extraction    . Hysteroscopy w/d&c N/A 11/27/2012    Procedure: DILATATION AND CURETTAGE /HYSTEROSCOPY;  Surgeon: Marylynn Pearson, MD;  Location: [facility];  Service: Gynecology;  Laterality: N/A;   History reviewed. No pertinent family history. History  Substance Use Topics  . Smoking status: Former Smoker    Quit date: 11/05/1990  . Smokeless tobacco: Not on file  . Alcohol Use: Yes     Comment: twice a week   OB History    No data available     Review of Systems  Constitutional: Negative.   Musculoskeletal: Negative.   Skin: Positive for wound.    Allergies  Review of patient's allergies indicates no known allergies.  Home Medications   Prior to Admission medications   Medication Sig Start Date End Date Taking? Authorizing Provider  calcium  carbonate (OS-CAL) 600 MG TABS tablet Take 600 mg by mouth daily with breakfast.    Historical Provider, MD  celecoxib (CELEBREX) 200 MG capsule Take 200 mg by mouth daily.    Historical Provider, MD  cholecalciferol (VITAMIN D) 1000 UNITS tablet Take 5,000 Units by mouth daily.    Historical Provider, MD  DULoxetine (CYMBALTA) 60 MG capsule Take 60 mg by mouth daily.    Historical Provider, MD  fish oil-omega-3 fatty acids 1000 MG capsule Take 2 g by mouth daily.    Historical Provider, MD  Golimumab (SIMPONI Gil[place]) Inject into the skin every 30 (thirty) days.    Historical Provider, MD  HYDROcodone-acetaminophen (NORCO/VICODIN) 5-325 MG per tablet Take 1 tablet by mouth every 6 (six) hours as needed for pain.    Historical Provider, MD  omeprazole (PRILOSEC) 40 MG capsule Take 40 mg by mouth daily.    Historical Provider, MD  oxyCODONE-acetaminophen (PERCOCET) 5-325 MG per tablet 1 to 2 tablets every 6 hours as needed for pain. 01/11/13   Harden Mo, MD   BP 137/90 mmHg  Pulse 71  Temp(Src) 97 F (36.1 C) (Oral)  Resp 20  SpO2 95% Physical Exam  Constitutional: She is oriented to person, place, and time. She appears well-developed and well-nourished. No distress.  Musculoskeletal: She exhibits no tenderness.  Neurological: She is alert  and oriented to person, place, and time.  Skin: Skin is warm and dry.  1cm superficial lac to distal phalanx of rif, nvt intact , no active bleeding., no sutures or treatment needed  Nursing note and vitals reviewed.   ED Course  Procedures (including critical care time) Labs Review Labs Reviewed - No data to display  Imaging Review No results found.   MDM   1. Finger laceration, initial encounter    Wound care given    Billy Fischer, MD 02/07/14 1347

## 2014-02-07 NOTE — ED Notes (Signed)
Pt  Sustained     A  Laceration  2  Nights  Ago  While  Carving  Food  With a  Knife           Bleeding has  Subsided        Tet  Shot  Within 5  Years

## 2014-11-02 ENCOUNTER — Other Ambulatory Visit: Payer: Self-pay | Admitting: Obstetrics and Gynecology

## 2014-11-03 LAB — CYTOLOGY - PAP

## 2015-11-08 ENCOUNTER — Other Ambulatory Visit: Payer: Self-pay | Admitting: Obstetrics and Gynecology

## 2015-11-08 DIAGNOSIS — R928 Other abnormal and inconclusive findings on diagnostic imaging of breast: Secondary | ICD-10-CM

## 2015-11-12 ENCOUNTER — Ambulatory Visit
Admission: RE | Admit: 2015-11-12 | Discharge: 2015-11-12 | Disposition: A | Discharge status: Home / Self Care | Payer: 59 | Source: Ambulatory Visit | Attending: Obstetrics and Gynecology | Admitting: Obstetrics and Gynecology

## 2015-11-12 DIAGNOSIS — R928 Other abnormal and inconclusive findings on diagnostic imaging of breast: Secondary | ICD-10-CM

## 2016-04-17 ENCOUNTER — Other Ambulatory Visit: Payer: Self-pay | Admitting: Obstetrics and Gynecology

## 2016-04-17 DIAGNOSIS — N6320 Unspecified lump in the left breast, unspecified quadrant: Secondary | ICD-10-CM

## 2016-05-18 ENCOUNTER — Ambulatory Visit
Admission: RE | Admit: 2016-05-18 | Discharge: 2016-05-18 | Disposition: A | Discharge status: Home / Self Care | Payer: 59 | Source: Ambulatory Visit | Attending: Obstetrics and Gynecology | Admitting: Obstetrics and Gynecology

## 2016-05-18 DIAGNOSIS — N6320 Unspecified lump in the left breast, unspecified quadrant: Secondary | ICD-10-CM

## 2016-06-14 ENCOUNTER — Other Ambulatory Visit: Payer: Self-pay | Admitting: Urology

## 2016-07-03 ENCOUNTER — Encounter (HOSPITAL_BASED_OUTPATIENT_CLINIC_OR_DEPARTMENT_OTHER): Payer: Self-pay | Admitting: *Deleted

## 2016-07-07 ENCOUNTER — Ambulatory Visit (HOSPITAL_BASED_OUTPATIENT_CLINIC_OR_DEPARTMENT_OTHER): Admission: RE | Admit: 2016-07-07 | Payer: 59 | Source: Ambulatory Visit | Admitting: Urology

## 2016-07-07 ENCOUNTER — Encounter (HOSPITAL_BASED_OUTPATIENT_CLINIC_OR_DEPARTMENT_OTHER): Admission: RE | Payer: Self-pay | Source: Ambulatory Visit

## 2016-07-07 HISTORY — DX: Obstructive sleep apnea (adult) (pediatric): G47.33

## 2016-07-07 HISTORY — DX: Calculus of kidney: N20.0

## 2016-07-07 HISTORY — DX: Calculus of ureter: N20.1

## 2016-07-07 HISTORY — DX: Gastro-esophageal reflux disease without esophagitis: K21.9

## 2016-07-07 SURGERY — CYSTOURETEROSCOPY, WITH RETROGRADE PYELOGRAM AND STENT INSERTION
Anesthesia: General | Laterality: Right

## 2016-09-08 ENCOUNTER — Other Ambulatory Visit: Payer: Self-pay | Admitting: Obstetrics and Gynecology

## 2016-09-08 DIAGNOSIS — N63 Unspecified lump in unspecified breast: Secondary | ICD-10-CM

## 2016-11-06 ENCOUNTER — Ambulatory Visit
Admission: RE | Admit: 2016-11-06 | Discharge: 2016-11-06 | Disposition: A | Discharge status: Home / Self Care | Payer: 59 | Source: Ambulatory Visit | Attending: Obstetrics and Gynecology | Admitting: Obstetrics and Gynecology

## 2016-11-06 DIAGNOSIS — N63 Unspecified lump in unspecified breast: Secondary | ICD-10-CM

## 2016-12-21 ENCOUNTER — Ambulatory Visit (INDEPENDENT_AMBULATORY_CARE_PROVIDER_SITE_OTHER): Payer: 59 | Admitting: Podiatry

## 2016-12-21 VITALS — BP 130/93 | HR 71 | Ht 67.0 in | Wt 200.0 lb

## 2016-12-21 DIAGNOSIS — G5782 Other specified mononeuropathies of left lower limb: Secondary | ICD-10-CM

## 2016-12-21 DIAGNOSIS — G5762 Lesion of plantar nerve, left lower limb: Secondary | ICD-10-CM

## 2016-12-21 NOTE — Progress Notes (Signed)
   Subjective:    Patient ID: Caitlin Moran, female    DOB: 1960/04/12, 56 y.o.   MRN: [id number] Chief Complaint  Patient presents with  . Callouses    bilateral calluses  . Foot Pain    left foot, burning pain bottom of the foot near the ball    HPI  56 y.o. female presents with the above complaint. Reports burning pain in the ball of the left foot; present for about a month. Denies prior treatments. Also complains to calluses to both feet  Past Medical History:  Diagnosis Date  . Anxiety   . Arthritis   . Depression   . GERD (gastroesophageal reflux disease)   . OSA (obstructive sleep apnea)    need to get CPAP  . PONV (postoperative nausea and vomiting)   . Renal calculus, right   . Right ureteral stone    Past Surgical History:  Procedure Laterality Date  . ABLATION     unquestionable  . CESAREAN SECTION    . CHOLECYSTECTOMY    . EXTRACORPOREAL SHOCK WAVE LITHOTRIPSY  02/14/2010  . HYSTEROSCOPY W/D&C N/A 11/27/2012   Procedure: DILATATION AND CURETTAGE /HYSTEROSCOPY;  Surgeon: Marylynn Pearson, MD;  Location: [facility];  Service: Gynecology;  Laterality: N/A;  . WISDOM TOOTH EXTRACTION      Current Outpatient Prescriptions:  .  rosuvastatin (CRESTOR) 5 MG tablet, Take 5 mg by mouth daily., Disp: , Rfl:  .  calcium carbonate (OS-CAL) 600 MG TABS tablet, Take 600 mg by mouth daily with breakfast., Disp: , Rfl:  .  celecoxib (CELEBREX) 200 MG capsule, Take 200 mg by mouth daily., Disp: , Rfl:  .  cholecalciferol (VITAMIN D) 1000 UNITS tablet, Take 5,000 Units by mouth daily., Disp: , Rfl:  .  DULoxetine (CYMBALTA) 60 MG capsule, Take 60 mg by mouth daily., Disp: , Rfl:  .  fish oil-omega-3 fatty acids 1000 MG capsule, Take 2 g by mouth daily., Disp: , Rfl:  .  Golimumab (SIMPONI [place]), Inject into the skin every 30 (thirty) days., Disp: , Rfl:  .  omeprazole (PRILOSEC) 40 MG capsule, Take 40 mg by mouth daily., Disp: , Rfl:   No Known Allergies   Review of Systems    All other systems reviewed and are negative.      Objective:   Physical Exam Vitals:   12/21/16 0748  BP: (!) 130/93  Pulse: 71    General AA&O x3. Normal mood and affect.  Vascular Dorsalis pedis and posterior tibial pulses  present 2+ bilaterally  Capillary refill normal to all digits. Pedal hair growth normal.  Neurologic Epicritic sensation grossly intact. Negative Tinel's sign 2nd/3rd interspace left   Dermatologic No open lesions. Interspaces clear of maceration. Nails well groomed and normal in appearance. Xerosis bilateral feet.   Orthopedic: MMT 5/5 in dorsiflexion, plantarflexion, inversion, and eversion. Normal joint ROM without pain or crepitus. Positive Mulder's click 3rd interspace left      Assessment & Plan:  Patient was evaluated and treated and all questions answered  Interdigital neuroma left foot -Educated on etiology -Padding dispensed -Injection delivered to the third interspace  Pes planus with callosities both feet -Will get appointment for orthotic fabrication. Will offload bony prominences/calluses  Procedure: Neuroma Injection Location: Left third interspace Skin Prep: Alcohol. Injectate: 0.5 cc 0.5% marcaine plain, 0.5 cc dexamethasone phosphate. Disposition: Patient tolerated procedure well. Injection site dressed with a band-aid.

## 2017-01-02 ENCOUNTER — Ambulatory Visit (INDEPENDENT_AMBULATORY_CARE_PROVIDER_SITE_OTHER): Payer: 59 | Admitting: Orthotics

## 2017-01-02 DIAGNOSIS — G5762 Lesion of plantar nerve, left lower limb: Secondary | ICD-10-CM | POA: Diagnosis not present

## 2017-01-02 DIAGNOSIS — G5782 Other specified mononeuropathies of left lower limb: Secondary | ICD-10-CM

## 2017-01-02 NOTE — Progress Notes (Signed)
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   Patient reports history of foot pain involving plantar aponeurosis.  Goal is to provide longitudinal arch support and correct any RF instability due to heel eversion/inversion.  Ultimate goal is to relieve tension at pf insertion calcaneal tuberosity.  Plan on semi-rigid device addressing heel stability and relieving PF tension.

## 2017-01-11 ENCOUNTER — Ambulatory Visit (INDEPENDENT_AMBULATORY_CARE_PROVIDER_SITE_OTHER): Payer: 59 | Admitting: Podiatry

## 2017-01-11 DIAGNOSIS — G5762 Lesion of plantar nerve, left lower limb: Secondary | ICD-10-CM | POA: Diagnosis not present

## 2017-01-11 DIAGNOSIS — G5782 Other specified mononeuropathies of left lower limb: Secondary | ICD-10-CM

## 2017-01-15 NOTE — Progress Notes (Signed)
  Subjective:  Patient ID: Caitlin Moran, female    DOB: 1961-01-05,  MRN: [id number]  Chief Complaint  Patient presents with  . Neuroma    left foot - doing better, injection started to help after a couple of days    56 y.o. female returns for the above complaint.  States that her left foot is doing better.  States that the injection helped after a couple of days.  Denies pain today.  Objective:  There were no vitals filed for this visit. General AA&O x3. Normal mood and affect.  Vascular Pedal pulses palpable.  Neurologic Epicritic sensation grossly intact.  Dermatologic No open lesions. Skin normal texture and turgor.  Orthopedic: No pain to palpation either foot.   Assessment & Plan:  Patient was evaluated and treated and all questions answered.  Neuroma left foot -Resolved. -No injection given today. -Educated on return precautions  Return if symptoms worsen or fail to improve.

## 2017-01-17 ENCOUNTER — Other Ambulatory Visit: Payer: Self-pay | Admitting: Physician Assistant

## 2017-01-17 DIAGNOSIS — R1011 Right upper quadrant pain: Secondary | ICD-10-CM

## 2017-01-17 DIAGNOSIS — R1013 Epigastric pain: Secondary | ICD-10-CM

## 2017-01-18 ENCOUNTER — Ambulatory Visit
Admission: RE | Admit: 2017-01-18 | Discharge: 2017-01-18 | Disposition: A | Discharge status: Home / Self Care | Payer: 59 | Source: Ambulatory Visit | Attending: Physician Assistant | Admitting: Physician Assistant

## 2017-01-18 ENCOUNTER — Other Ambulatory Visit (HOSPITAL_COMMUNITY): Payer: Self-pay | Admitting: Physician Assistant

## 2017-01-18 ENCOUNTER — Other Ambulatory Visit: Payer: Self-pay | Admitting: Physician Assistant

## 2017-01-18 DIAGNOSIS — K838 Other specified diseases of biliary tract: Secondary | ICD-10-CM

## 2017-01-18 DIAGNOSIS — R1011 Right upper quadrant pain: Secondary | ICD-10-CM

## 2017-01-18 DIAGNOSIS — K8689 Other specified diseases of pancreas: Secondary | ICD-10-CM

## 2017-01-18 DIAGNOSIS — R1013 Epigastric pain: Secondary | ICD-10-CM

## 2017-01-20 ENCOUNTER — Other Ambulatory Visit: Payer: Self-pay | Admitting: Physician Assistant

## 2017-01-20 ENCOUNTER — Ambulatory Visit (HOSPITAL_COMMUNITY)
Admission: RE | Admit: 2017-01-20 | Discharge: 2017-01-20 | Disposition: A | Discharge status: Home / Self Care | Payer: 59 | Source: Ambulatory Visit | Attending: Physician Assistant | Admitting: Physician Assistant

## 2017-01-20 DIAGNOSIS — Z789 Other specified health status: Secondary | ICD-10-CM

## 2017-01-20 DIAGNOSIS — K7689 Other specified diseases of liver: Secondary | ICD-10-CM | POA: Diagnosis not present

## 2017-01-20 DIAGNOSIS — K8689 Other specified diseases of pancreas: Secondary | ICD-10-CM

## 2017-01-20 DIAGNOSIS — R1011 Right upper quadrant pain: Secondary | ICD-10-CM

## 2017-01-20 DIAGNOSIS — R1013 Epigastric pain: Secondary | ICD-10-CM

## 2017-01-20 DIAGNOSIS — Z9049 Acquired absence of other specified parts of digestive tract: Secondary | ICD-10-CM | POA: Insufficient documentation

## 2017-01-20 DIAGNOSIS — K838 Other specified diseases of biliary tract: Secondary | ICD-10-CM

## 2017-01-20 MED ORDER — GADOBENATE DIMEGLUMINE 529 MG/ML IV SOLN
20.0000 mL | Freq: Once | INTRAVENOUS | Status: AC | PRN
  Administered 2017-01-20: 19 mL via INTRAVENOUS

## 2017-01-22 LAB — POCT I-STAT CREATININE: Creatinine, Ser: 0.7 mg/dL (ref 0.44–1.00)

## 2017-01-23 ENCOUNTER — Ambulatory Visit: Payer: 59 | Admitting: Orthotics

## 2017-01-23 DIAGNOSIS — G5782 Other specified mononeuropathies of left lower limb: Secondary | ICD-10-CM

## 2017-01-23 NOTE — Progress Notes (Signed)
Patient came in today to pick up custom made foot orthotics.  The goals were accomplished and the patient reported no dissatisfaction with said orthotics.  Patient was advised of breakin period and how to report any issues. 

## 2017-01-26 ENCOUNTER — Ambulatory Visit: Payer: 59 | Admitting: Family Medicine

## 2017-01-27 ENCOUNTER — Other Ambulatory Visit: Payer: 59

## 2017-07-12 ENCOUNTER — Ambulatory Visit (INDEPENDENT_AMBULATORY_CARE_PROVIDER_SITE_OTHER): Payer: Self-pay | Admitting: Podiatry

## 2017-07-12 DIAGNOSIS — G5762 Lesion of plantar nerve, left lower limb: Secondary | ICD-10-CM

## 2017-07-12 NOTE — Progress Notes (Signed)
  Subjective:  Patient ID: Caitlin Moran, female    DOB: 05-20-1960,  MRN: [id number]  Chief Complaint  Patient presents with  . Neuroma    Left foot - hurting again x 3 weeks   . Callouses    right plantar forefoot.    57 y.o. female returns for the above complaint.  States the left foot pain is back and states the injection helped last time.  Objective:  There were no vitals filed for this visit. General AA&O x3. Normal mood and affect.  Vascular Pedal pulses palpable.  Neurologic Epicritic sensation grossly intact.  Dermatologic No open lesions. Skin normal texture and turgor.  Orthopedic: Pain to palpation L 3rd interspace with mulder's click   Assessment & Plan:  Patient was evaluated and treated and all questions answered.  Neuroma left foot -Repeat injection today. -Padding dispensed.  Procedure: Neuroma Injection Location: Left 3rd interspace Skin Prep: Alcohol. Injectate: 0.5 cc 0.5% marcaine plain, 0.5 cc dexamethasone phosphate. Disposition: Patient tolerated procedure well. Injection site dressed with a band-aid.  Return in about 3 weeks (around 08/02/2017) for Neuroma L Foot.

## 2017-08-02 ENCOUNTER — Ambulatory Visit: Payer: Self-pay | Admitting: Podiatry

## 2017-10-01 ENCOUNTER — Other Ambulatory Visit: Payer: Self-pay | Admitting: Obstetrics and Gynecology

## 2017-10-01 DIAGNOSIS — N6320 Unspecified lump in the left breast, unspecified quadrant: Secondary | ICD-10-CM

## 2017-10-04 ENCOUNTER — Ambulatory Visit: Payer: Self-pay | Admitting: Podiatry

## 2017-11-06 ENCOUNTER — Encounter (INDEPENDENT_AMBULATORY_CARE_PROVIDER_SITE_OTHER): Payer: Self-pay | Admitting: Vascular Surgery

## 2017-11-06 ENCOUNTER — Ambulatory Visit (INDEPENDENT_AMBULATORY_CARE_PROVIDER_SITE_OTHER): Payer: PRIVATE HEALTH INSURANCE | Admitting: Vascular Surgery

## 2017-11-06 VITALS — BP 134/91 | HR 83 | Resp 16 | Ht 67.0 in | Wt 241.0 lb

## 2017-11-06 DIAGNOSIS — E785 Hyperlipidemia, unspecified: Secondary | ICD-10-CM

## 2017-11-06 DIAGNOSIS — I83811 Varicose veins of right lower extremities with pain: Secondary | ICD-10-CM

## 2017-11-06 DIAGNOSIS — K219 Gastro-esophageal reflux disease without esophagitis: Secondary | ICD-10-CM | POA: Diagnosis not present

## 2017-11-06 DIAGNOSIS — I83812 Varicose veins of left lower extremities with pain: Secondary | ICD-10-CM | POA: Diagnosis not present

## 2017-11-06 DIAGNOSIS — I83813 Varicose veins of bilateral lower extremities with pain: Secondary | ICD-10-CM | POA: Insufficient documentation

## 2017-11-06 NOTE — Assessment & Plan Note (Signed)
lipid control important in reducing the progression of atherosclerotic disease. Continue statin therapy  

## 2017-11-06 NOTE — Progress Notes (Signed)
Patient ID: Caitlin Moran, female   DOB: [birth year], 56 y.o.   MRN: [id number]  Chief Complaint  Patient presents with  . Follow-up    varicose veins    HPI Caitlin Moran is a 57 y.o. female.   The patient presents with complaints of symptomatic varicosities of the lower extremities. The patient reports a long standing history of varicosities and they have become painful over time. There was no clear inciting event or causative factor that started the symptoms.  She was actually seen in [place] about 2 years ago for venous disease and had a work-up including a reflux study.  Laser ablation or radiofrequency ablation were discussed at that time, but her insurance at that time was unwilling to cover the procedure so she continued conservative therapy of compression stockings and elevation.  The left leg is more severly affected terms of both pain and swelling. The patient elevates the legs for relief. The pain is described as heaviness and tiredness in the legs. The symptoms are generally most severe in the evening, particularly when they have been on their feet for long periods of time.  Pression stockings and elevation have been used to try to improve the symptoms with vomited success. The patient complains of sending swelling as an associated symptom particularly in the left leg over the past several months.  She actually had a DVT study done about a month ago because her swelling got so bad.  No DVT was seen on that study in the left leg. The patient has no previous history of deep venous thrombosis or superficial thrombophlebitis to their knowledge.     Past Medical History:  Diagnosis Date  . Anxiety   . Arthritis   . Depression   . GERD (gastroesophageal reflux disease)   . OSA (obstructive sleep apnea)    need to get CPAP  . PONV (postoperative nausea and vomiting)   . Renal calculus, right   . Right ureteral stone     Past Surgical History:  Procedure Laterality  Date  . ABLATION     unquestionable  . CESAREAN SECTION    . CHOLECYSTECTOMY    . EXTRACORPOREAL SHOCK WAVE LITHOTRIPSY  02/14/2010  . HYSTEROSCOPY W/D&C N/A 11/27/2012   Procedure: DILATATION AND CURETTAGE /HYSTEROSCOPY;  Surgeon: Marylynn Pearson, MD;  Location: [facility];  Service: Gynecology;  Laterality: N/A;  . WISDOM TOOTH EXTRACTION      Family History  Problem Relation Age of Onset  . Breast cancer Maternal Aunt   no bleeding disorders, clotting disorders, autoimmune diseases, or aneurysms  Social History Social History   Tobacco Use  . Smoking status: Former Smoker    Last attempt to quit: 11/05/1990    Years since quitting: 27.0  . Smokeless tobacco: Never Used  Substance Use Topics  . Alcohol use: Yes    Comment: twice a week  . Drug use: No    No Known Allergies  Current Outpatient Medications  Medication Sig Dispense Refill  . calcium carbonate (OS-CAL) 600 MG TABS tablet Take 600 mg by mouth daily with breakfast.    . celecoxib (CELEBREX) 200 MG capsule Take 200 mg by mouth daily.    . cholecalciferol (VITAMIN D) 1000 UNITS tablet Take 5,000 Units by mouth daily.    . DULoxetine (CYMBALTA) 60 MG capsule Take 60 mg by mouth daily.    . fish oil-omega-3 fatty acids 1000 MG capsule Take 2 g by mouth daily.    . Golimumab (  SIMPONI [place]) Inject into the skin every 30 (thirty) days.    Marland Kitchen omeprazole (PRILOSEC) 40 MG capsule Take 40 mg by mouth daily.    . rosuvastatin (CRESTOR) 5 MG tablet Take 5 mg by mouth daily.     No current facility-administered medications for this visit.       REVIEW OF SYSTEMS (Negative unless checked)  Constitutional: [] Weight loss  [] Fever  [] Chills Cardiac: [] Chest pain   [] Chest pressure   [] Palpitations   [] Shortness of breath when laying flat   [] Shortness of breath at rest   [] Shortness of breath with exertion. Vascular:  [] Pain in legs with walking   [] Pain in legs at rest   [] Pain in legs when laying flat   [] Claudication   [] Pain  in feet when walking  [] Pain in feet at rest  [] Pain in feet when laying flat   [] History of DVT   [] Phlebitis   [x] Swelling in legs   [x] Varicose veins   [] Non-healing ulcers Pulmonary:   [] Uses home oxygen   [] Productive cough   [] Hemoptysis   [] Wheeze  [] COPD   [] Asthma Neurologic:  [] Dizziness  [] Blackouts   [] Seizures   [] History of stroke   [] History of TIA  [] Aphasia   [] Temporary blindness   [] Dysphagia   [] Weakness or numbness in arms   [] Weakness or numbness in legs Musculoskeletal:  [x] Arthritis   [] Joint swelling   [] Joint pain   [] Low back pain Hematologic:  [] Easy bruising  [] Easy bleeding   [] Hypercoagulable state   [] Anemic  [] Hepatitis Gastrointestinal:  [] Blood in stool   [] Vomiting blood  [x] Gastroesophageal reflux/heartburn   [] Abdominal pain Genitourinary:  [] Chronic kidney disease   [] Difficult urination  [] Frequent urination  [] Burning with urination   [] Hematuria Skin:  [] Rashes   [] Ulcers   [] Wounds Psychological:  [x] History of anxiety   [x]  History of major depression.    Physical Exam BP (!) 134/91 (BP Location: Right Arm)   Pulse 83   Resp 16   Ht 5\' 7"  (1.702 m)   Wt 241 lb (109.3 kg)   LMP 09/24/2012   BMI 37.75 kg/m  Gen:  WD/WN, NAD Head: [place]/AT, No temporalis wasting.  Ear/Nose/Throat: Hearing grossly intact, dentition good Eyes: Sclera non-icteric. Conjunctiva clear Neck: Supple. Trachea midline Pulmonary:  Good air movement, no use of accessory muscles, respirations not labored.  Cardiac: RRR, No JVD Vascular: Varicosities extensive and measuring up to 3-4 mm in the right lower extremity        Varicosities diffuse and measuring up to 2-3 mm in the left lower extremity Vessel Right Left  Radial Palpable Palpable  \                        PT Palpable Palpable  DP Palpable Palpable   Gastrointestinal: soft, non-tender/non-distended.  Musculoskeletal: M/S 5/5 throughout.   Trace RLE edema.  1 + LLE edema Neurologic: Sensation grossly intact  in extremities.  Symmetrical.  Speech is fluent.  Psychiatric: Judgment intact, Mood & affect appropriate for pt's clinical situation. Dermatologic: No rashes or ulcers noted.  No cellulitis or open wounds.    Radiology No results found.  Labs No results found for this or any previous visit (from the past 2160 hour(s)).  Assessment/Plan:  GERD (gastroesophageal reflux disease) Continue PPI as already ordered, this medication has been reviewed and there are no changes at this time.  Avoidence of caffeine and alcohol  Moderate elevation of the head of the bed  Hyperlipidemia lipid control important in reducing the progression of atherosclerotic disease. Continue statin therapy   Varicose veins of leg with pain, bilateral   The patient has symptoms consistent with chronic venous insufficiency. We discussed the natural history and treatment options for venous disease. I recommended the regular use of 20 - 30 mm Hg compression stockings, and she already has these and has used them with limited success. I recommended leg elevation and anti-inflammatories as needed for pain. I have also recommended a complete venous duplex to assess the venous system for reflux or thrombotic issues.  He has had 1 of these previously and intervention was discussed, but that has been a couple of years ago now.  This can be done at the patient's convenience. I will see the patient back after the duplex to assess the response to conservative management, and determine further treatment options.     Leotis Pain 11/06/2017, 12:08 PM   This note was created with Dragon medical transcription system.  Any errors from dictation are unintentional.

## 2017-11-06 NOTE — Patient Instructions (Signed)
Varicose Veins Varicose veins are veins that have become enlarged and twisted. They are usually seen in the legs but can occur in other parts of the body as well. What are the causes? This condition is the result of valves in the veins not working properly. Valves in the veins help to return blood from the leg to the heart. If these valves are damaged, blood flows backward and backs up into the veins in the leg near the skin. This causes the veins to become larger. What increases the risk? People who are on their feet a lot, who are pregnant, or who are overweight are more likely to develop varicose veins. What are the signs or symptoms?  Bulging, twisted-appearing, bluish veins, most commonly found on the legs.  Leg pain or a feeling of heaviness. These symptoms may be worse at the end of the day.  Leg swelling.  Changes in skin color. How is this diagnosed? A health care provider can usually diagnose varicose veins by examining your legs. Your health care provider may also recommend an ultrasound of your leg veins. How is this treated? Most varicose veins can be treated at home.However, other treatments are available for people who have persistent symptoms or want to improve the cosmetic appearance of the varicose veins. These treatment options include:  Sclerotherapy. A solution is injected into the vein to close it off.  Laser treatment. A laser is used to heat the vein to close it off.  Radiofrequency vein ablation. An electrical current produced by radio waves is used to close off the vein.  Phlebectomy. The vein is surgically removed through small incisions made over the varicose vein.  Vein ligation and stripping. The vein is surgically removed through incisions made over the varicose vein after the vein has been tied (ligated). Follow these instructions at home:   Do not stand or sit in one position for long periods of time. Do not sit with your legs crossed. Rest with your  legs raised during the day.  Wear compression stockings as directed by your health care provider. These stockings help to prevent blood clots and reduce swelling in your legs.  Do not wear other tight, encircling garments around your legs, pelvis, or waist.  Walk as much as possible to increase blood flow.  Raise the foot of your bed at night with 2-inch blocks.  If you get a cut in the skin over the vein and the vein bleeds, lie down with your leg raised and press on it with a clean cloth until the bleeding stops. Then place a bandage (dressing) on the cut. See your health care provider if it continues to bleed. Contact a health care provider if:  The skin around your ankle starts to break down.  You have pain, redness, tenderness, or hard swelling in your leg over a vein.  You are uncomfortable because of leg pain. This information is not intended to replace advice given to you by your health care provider. Make sure you discuss any questions you have with your health care provider. Document Released: 12/07/2004 Document Revised: 08/05/2015 Document Reviewed: 08/31/2015 Elsevier Interactive Patient Education  2017 [employer]vier [employer]  

## 2017-11-06 NOTE — Assessment & Plan Note (Signed)
Continue PPI as already ordered, this medication has been reviewed and there are no changes at this time. Avoidence of caffeine and alcohol Moderate elevation of the head of the bed  

## 2017-11-07 ENCOUNTER — Other Ambulatory Visit: Payer: Self-pay

## 2017-11-07 DIAGNOSIS — I83813 Varicose veins of bilateral lower extremities with pain: Secondary | ICD-10-CM

## 2017-11-20 ENCOUNTER — Ambulatory Visit
Admission: RE | Admit: 2017-11-20 | Discharge: 2017-11-20 | Disposition: A | Discharge status: Home / Self Care | Payer: No Typology Code available for payment source | Source: Ambulatory Visit | Attending: Obstetrics and Gynecology | Admitting: Obstetrics and Gynecology

## 2017-11-20 DIAGNOSIS — N6320 Unspecified lump in the left breast, unspecified quadrant: Secondary | ICD-10-CM

## 2017-12-10 ENCOUNTER — Other Ambulatory Visit: Payer: Self-pay

## 2017-12-10 ENCOUNTER — Encounter: Payer: Self-pay | Admitting: Surgery

## 2017-12-10 ENCOUNTER — Ambulatory Visit (INDEPENDENT_AMBULATORY_CARE_PROVIDER_SITE_OTHER): Payer: No Typology Code available for payment source | Admitting: Surgery

## 2017-12-10 ENCOUNTER — Ambulatory Visit (HOSPITAL_COMMUNITY)
Admission: RE | Admit: 2017-12-10 | Discharge: 2017-12-10 | Disposition: A | Discharge status: Home / Self Care | Payer: PRIVATE HEALTH INSURANCE | Source: Ambulatory Visit | Attending: Surgery | Admitting: Surgery

## 2017-12-10 VITALS — BP 140/84 | HR 71 | Temp 97.2°F | Resp 14 | Ht 66.5 in | Wt 235.0 lb

## 2017-12-10 DIAGNOSIS — I83813 Varicose veins of bilateral lower extremities with pain: Secondary | ICD-10-CM

## 2017-12-10 DIAGNOSIS — M7989 Other specified soft tissue disorders: Secondary | ICD-10-CM | POA: Insufficient documentation

## 2017-12-10 DIAGNOSIS — R600 Localized edema: Secondary | ICD-10-CM | POA: Diagnosis present

## 2017-12-10 NOTE — Progress Notes (Signed)
[facility] [place]  Patient name: Caitlin Moran MRN: [id number] DOB: [birth year] Sex: female   REQUESTING PROVIDER:    self   REASON FOR CONSULT:    Varicose veins and leg swelling  HISTORY OF PRESENT ILLNESS:   Caitlin Moran is a 57 y.o. female, who comes in today for evaluation of chronic issues regarding veins in her legs.  She states that she was evaluated for this in [place] about 2 years ago.  There were insurance issues and so nothing has been done.  She had a flareup approximately 1 month ago and was seen in [place] by Dr. Lucky Cowboy.  She lives in [place] and wanted to have treatment here.  She states that the veins on her right leg bother her the most but that she has more pain in her left leg.  She does endorse bilateral edema that is worse at the end of the day when she is on her feet.  It is alleviated by rest.  She also has pain in both legs.  The left leg bothers her more than the right.  She has worn compression stockings in the past but has tried to do so more consistently since the vascular surgery 1 month ago.  She tries to nonsteroidals for her pain flareups.  She has not had any issues of bleeding.  She denies any history of DVT or thrombophlebitis.  PAST MEDICAL HISTORY    Past Medical History:  Diagnosis Date  . Anxiety   . Arthritis   . Depression   . GERD (gastroesophageal reflux disease)   . OSA (obstructive sleep apnea)    need to get CPAP  . PONV (postoperative nausea and vomiting)   . Renal calculus, right   . Right ureteral stone      FAMILY HISTORY   Family History  Problem Relation Age of Onset  . Breast cancer Maternal Aunt     SOCIAL HISTORY:   Social History   Socioeconomic History  . Marital status: Married    Spouse name: Not on file  . Number of children: Not on file  . Years of education: Not on file  . Highest education level: Not on file  Occupational  History  . Not on file  Social Needs  . Financial resource strain: Not on file  . Food insecurity:    Worry: Not on file    Inability: Not on file  . Transportation needs:    Medical: Not on file    Non-medical: Not on file  Tobacco Use  . Smoking status: Former Smoker    Last attempt to quit: 11/05/1990    Years since quitting: 27.1  . Smokeless tobacco: Never Used  Substance and Sexual Activity  . Alcohol use: Yes    Comment: twice a week  . Drug use: No  . Sexual activity: Not on file  Lifestyle  . Physical activity:    Days per week: Not on file    Minutes per session: Not on file  . Stress: Not on file  Relationships  . Social connections:    Talks on phone: Not on file    Gets together: Not on file    Attends religious service: Not on file    Active member of club or organization: Not on file    Attends meetings of clubs or organizations: Not on file    Relationship status: Not on file  . Intimate partner violence:    Fear of  current or ex partner: Not on file    Emotionally abused: Not on file    Physically abused: Not on file    Forced sexual activity: Not on file  Other Topics Concern  . Not on file  Social History Narrative  . Not on file    ALLERGIES:    No Known Allergies  CURRENT MEDICATIONS:    Current Outpatient Medications  Medication Sig Dispense Refill  . calcium carbonate (OS-CAL) 600 MG TABS tablet Take 600 mg by mouth daily with breakfast.    . celecoxib (CELEBREX) 200 MG capsule Take 200 mg by mouth daily.    . cholecalciferol (VITAMIN D) 1000 UNITS tablet Take 5,000 Units by mouth daily.    . DULoxetine (CYMBALTA) 60 MG capsule Take 60 mg by mouth daily.    . fish oil-omega-3 fatty acids 1000 MG capsule Take 2 g by mouth daily.    . Golimumab (SIMPONI [place]) Inject into the skin every 30 (thirty) days.    Marland Kitchen omeprazole (PRILOSEC) 40 MG capsule Take 40 mg by mouth daily.    . rosuvastatin (CRESTOR) 5 MG tablet Take 5 mg by mouth daily.       No current facility-administered medications for this visit.     REVIEW OF SYSTEMS:   [X]  denotes positive finding, [ ]  denotes negative finding Cardiac  Comments:  Chest pain or chest pressure:    Shortness of breath upon exertion:    Short of breath when lying flat:    Irregular heart rhythm:        Vascular    Pain in calf, thigh, or hip brought on by ambulation:    Pain in feet at night that wakes you up from your sleep:     Blood clot in your veins:    Leg swelling:  x       Pulmonary    Oxygen at home:    Productive cough:     Wheezing:         Neurologic    Sudden weakness in arms or legs:     Sudden numbness in arms or legs:     Sudden onset of difficulty speaking or slurred speech:    Temporary loss of vision in one eye:     Problems with dizziness:         Gastrointestinal    Blood in stool:      Vomited blood:         Genitourinary    Burning when urinating:     Blood in urine:        Psychiatric    Major depression:         Hematologic    Bleeding problems:    Problems with blood clotting too easily:        Skin    Rashes or ulcers:        Constitutional    Fever or chills:     PHYSICAL EXAM:   There were no vitals filed for this visit.  GENERAL: The patient is a well-nourished female, in no acute distress. The vital signs are documented above. CARDIAC: There is a regular rate and rhythm.  VASCULAR: 1+ pitting edema bilaterally.  Prominent varicosities along the medial right calf. PULMONARY: Nonlabored respirations MUSCULOSKELETAL: There are no major deformities or cyanosis. NEUROLOGIC: No focal weakness or paresthesias are detected. SKIN: There are no ulcers or rashes noted. PSYCHIATRIC: The patient has a normal affect.  STUDIES:   I have ordered and  reviewed her vascular lab studies with the following findings:  Venous Reflux Times Normal value < 0.5 sec +------------------------------+----------+---------+                 Right (ms)Left (ms) +------------------------------+----------+---------+ GSV at Saphenofemoral junction968.00  1438.00  +------------------------------+----------+---------+ GSV prox thigh        4496.00        +------------------------------+----------+---------+ GSV mid thigh         8376.00  844.00   +------------------------------+----------+---------+ GSV dist thigh        4731.00        +------------------------------+----------+---------+ GSV at knee          3609.00  667.00   +------------------------------+----------+---------+ GSV prox calf         4137.00        +------------------------------+----------+---------+  Vein Diameters: +------------------------------+----------+---------+                Right (cm)Left (cm) +------------------------------+----------+---------+ GSV at Saphenofemoral junction0.842   0.748   +------------------------------+----------+---------+ GSV at prox thigh       0.7    0.402   +------------------------------+----------+---------+ GSV at mid thigh       0.684   0.298   +------------------------------+----------+---------+ GSV at distal thigh      0.775   0.328   +------------------------------+----------+---------+ GSV at knee          0.541   0.205   +------------------------------+----------+---------+ GSV prox calf         0.589         +------------------------------+----------+---------+ SSV origin          0.265   0.283   +------------------------------+----------+---------+ SSV prox           0.292   0.319   +------------------------------+----------+---------+ SSV mid            0.364   0.353   +------------------------------+----------+---------+  Final  Interpretation: Right: Abnormal reflux times were noted in the great saphenous vein at the saphenofemoral junction, great saphenous vein at the proximal thigh, great saphenous vein at the mid thigh, great saphenous vein at the distal thigh, and great saphenous vein at  the prox calf. There is no evidence of deep vein thrombosis in the lower extremity.There is no evidence of superficial venous thrombosis.  Left: Abnormal reflux times were noted in the great saphenous vein at the saphenofemoral junction, great saphenous vein at the mid thigh, and great saphenous vein at the knee. There is no evidence of deep vein thrombosis in the lower extremity.There is  no evidence of superficial venous thrombosis.   ASSESSMENT and PLAN   CEAP class3; the patient is having significant issues with leg pain and swelling in both legs.  She has been wearing compression intermittently for the past month which does provide some relief.  After reviewing her venous reflux study, I discussed that based on diameter profiles, she would likely be a candidate for laser ablation of the right saphenous vein and stab phlebectomy of the prominent varicosities on the right.  She probably does not have adequate diameter measurements for intervention of the left at this time.  I told her that I would recommend continuing to wear thigh-high 20-30 mm compression stockings.  She will come back to see 1 of my partners in 2 months to determine if she is a candidate for intervention, and what the most appropriate next course of action would be.   Annamarie Major, MD [facility] [phone] Pager [phone]

## 2017-12-14 ENCOUNTER — Ambulatory Visit: Payer: PRIVATE HEALTH INSURANCE

## 2017-12-14 ENCOUNTER — Ambulatory Visit: Payer: PRIVATE HEALTH INSURANCE | Admitting: Podiatry

## 2017-12-14 DIAGNOSIS — G5763 Lesion of plantar nerve, bilateral lower limbs: Secondary | ICD-10-CM

## 2017-12-14 DIAGNOSIS — M79672 Pain in left foot: Secondary | ICD-10-CM

## 2017-12-14 NOTE — Progress Notes (Signed)
Subjective:  Patient ID: Caitlin Moran, female    DOB: Feb 04, 1961,  MRN: [id number]  Chief Complaint  Patient presents with  . Foot Pain    Pt states neuroma pain under left foot balls of toes  . Foot Pain    Pt states possible neuroma under 1st  toe right foot   57 y.o. female presents with the above complaint. Reports pain to the left foot for a bout 2 weeks. Injection she had previously helped for a few weeks. Concerned she also has another one to the right foot but it's been hurting for less than 1 week.  Review of Systems: Negative except as noted in the HPI. Denies N/V/F/Ch.  Past Medical History:  Diagnosis Date  . Anxiety   . Arthritis   . Depression   . GERD (gastroesophageal reflux disease)   . OSA (obstructive sleep apnea)    need to get CPAP  . PONV (postoperative nausea and vomiting)   . Renal calculus, right   . Right ureteral stone     Current Outpatient Medications:  .  calcium carbonate (OS-CAL) 600 MG TABS tablet, Take 600 mg by mouth daily with breakfast., Disp: , Rfl:  .  celecoxib (CELEBREX) 200 MG capsule, Take 200 mg by mouth daily., Disp: , Rfl:  .  cholecalciferol (VITAMIN D) 1000 UNITS tablet, Take 5,000 Units by mouth daily., Disp: , Rfl:  .  DULoxetine (CYMBALTA) 60 MG capsule, Take 60 mg by mouth daily., Disp: , Rfl:  .  fish oil-omega-3 fatty acids 1000 MG capsule, Take 2 g by mouth daily., Disp: , Rfl:  .  Golimumab (SIMPONI [place]), Inject into the skin every 30 (thirty) days., Disp: , Rfl:  .  HYDROcodone-acetaminophen (NORCO) 7.5-325 MG tablet, TK 1 T PO TID FOR 30 DAYS, Disp: , Rfl: 0 .  omeprazole (PRILOSEC) 20 MG capsule, , Disp: , Rfl:  .  omeprazole (PRILOSEC) 40 MG capsule, Take 40 mg by mouth daily., Disp: , Rfl:  .  rosuvastatin (CRESTOR) 5 MG tablet, Take 5 mg by mouth daily., Disp: , Rfl:  .  Vitamin D, Ergocalciferol, (DRISDOL) 50000 units CAPS capsule, Take by mouth., Disp: , Rfl:   Social History   Tobacco Use  Smoking Status  Former Smoker  . Last attempt to quit: 11/05/1990  . Years since quitting: 27.1  Smokeless Tobacco Never Used    No Known Allergies Objective:  There were no vitals filed for this visit. There is no height or weight on file to calculate BMI. Constitutional Well developed. Well nourished.  Vascular Dorsalis pedis pulses palpable bilaterally. Posterior tibial pulses palpable bilaterally. Capillary refill normal to all digits.  No cyanosis or clubbing noted. Pedal hair growth normal.  Neurologic Normal speech. Oriented to person, place, and time. Epicritic sensation to light touch grossly present bilaterally.  Dermatologic Nails well groomed and normal in appearance. No open wounds. No skin lesions.  Orthopedic: Normal joint ROM without pain or crepitus bilaterally. No visible deformities. POP 3rd interspace bilat with mulder's click.   Radiographs: None today Assessment:   1. Morton's neuroma of both feet    Plan:  Patient was evaluated and treated and all questions answered.  Neuroma Bilat -Discussed injection therapy with patient would like to proceed. -Injection delivered bilat as below. -Metatarsal pads dispensed. Educated on use. -Advised to call if they flare up again and she would like another injection.  Procedure: Neuroma Injection Location: Bilateral 3rd interspace Skin Prep: Alcohol. Injectate: 0.5 cc 0.5%  marcaine plain, 0.5 cc dexamethasone phosphate. Disposition: Patient tolerated procedure well. Injection site dressed with a band-aid.  Return if symptoms worsen or fail to improve.

## 2018-01-24 ENCOUNTER — Ambulatory Visit (INDEPENDENT_AMBULATORY_CARE_PROVIDER_SITE_OTHER): Payer: No Typology Code available for payment source | Admitting: Vascular Surgery

## 2018-01-24 ENCOUNTER — Encounter: Payer: Self-pay | Admitting: Vascular Surgery

## 2018-01-24 ENCOUNTER — Other Ambulatory Visit: Payer: Self-pay

## 2018-01-24 VITALS — BP 139/88 | HR 72 | Temp 96.9°F | Resp 16 | Ht 66.0 in | Wt 231.0 lb

## 2018-01-24 DIAGNOSIS — I83813 Varicose veins of bilateral lower extremities with pain: Secondary | ICD-10-CM

## 2018-01-24 NOTE — Progress Notes (Signed)
Patient name: Caitlin Moran MRN: [id number] DOB: Jun 15, 1960 Sex: female  REASON FOR VISIT:   74-month follow-up visit.  HPI:   Caitlin Moran is a pleasant 57 y.o. female  who was seen by Dr. Trula Slade on 12/10/2017 with varicose veins and leg swelling.  She stated at that time that the veins in her right leg bothered her the most but she was also having significant symptoms on the left side.  She had edema of both lower extremities which is worse at the end of the day.  The patient was felt to have CEAP C-3 venous disease.  She had been wearing compression stockings for the last month which did not provide significant relief.  She was told to continue wearing her 20 to 30 mmHg compression stockings (thigh-high) to elevate her legs and take ibuprofen as needed for pain.  She was to return in 2 months for assessment for possible endovenous laser ablation of the right saphenous vein with stab phlebectomies.  Since she was seen last she is continued to wear her thigh-high compression stockings with a gradient of 20 to 30 mmHg.  These have not helped her symptoms significantly.  In addition she sits at work and they bunch up some behind her knees.  She continues to have aching pain in both legs which is described as aching and heaviness.  The symptoms are aggravated by sitting and standing and relieved somewhat with elevation.  Her medical physician does not want her taking too much ibuprofen.  She does take some as needed for pain.  She is unaware of any previous history of DVT or phlebitis.  Past Medical History:  Diagnosis Date  . Anxiety   . Arthritis   . Depression   . GERD (gastroesophageal reflux disease)   . OSA (obstructive sleep apnea)    need to get CPAP  . PONV (postoperative nausea and vomiting)   . Renal calculus, right   . Right ureteral stone     Family History  Problem Relation Age of Onset  . Breast cancer Maternal Aunt     SOCIAL HISTORY: Social History    Tobacco Use  . Smoking status: Former Smoker    Last attempt to quit: 11/05/1990    Years since quitting: 27.2  . Smokeless tobacco: Never Used  Substance Use Topics  . Alcohol use: Yes    Comment: twice a week    No Known Allergies  Current Outpatient Medications  Medication Sig Dispense Refill  . calcium carbonate (OS-CAL) 600 MG TABS tablet Take 600 mg by mouth daily with breakfast.    . celecoxib (CELEBREX) 200 MG capsule Take 200 mg by mouth daily.    . cholecalciferol (VITAMIN D) 1000 UNITS tablet Take 2,000 Units by mouth daily.     . DULoxetine (CYMBALTA) 60 MG capsule Take 60 mg by mouth daily.    . fish oil-omega-3 fatty acids 1000 MG capsule Take 2 g by mouth daily.    . Golimumab (SIMPONI [place]) Inject into the skin every 30 (thirty) days.    Marland Kitchen HYDROcodone-acetaminophen (NORCO) 7.5-325 MG tablet TK 1 T PO TID FOR 30 DAYS  0  . omeprazole (PRILOSEC) 20 MG capsule     . omeprazole (PRILOSEC) 40 MG capsule Take 40 mg by mouth daily.    . rosuvastatin (CRESTOR) 5 MG tablet Take 5 mg by mouth daily.    . Vitamin D, Ergocalciferol, (DRISDOL) 50000 units CAPS capsule Take by mouth.  No current facility-administered medications for this visit.     REVIEW OF SYSTEMS:  [X]  denotes positive finding, [ ]  denotes negative finding Cardiac  Comments:  Chest pain or chest pressure:    Shortness of breath upon exertion:    Short of breath when lying flat:    Irregular heart rhythm:        Vascular    Pain in calf, thigh, or hip brought on by ambulation:    Pain in feet at night that wakes you up from your sleep:     Blood clot in your veins:    Leg swelling:         Pulmonary    Oxygen at home:    Productive cough:     Wheezing:         Neurologic    Sudden weakness in arms or legs:     Sudden numbness in arms or legs:     Sudden onset of difficulty speaking or slurred speech:    Temporary loss of vision in one eye:     Problems with dizziness:          Gastrointestinal    Blood in stool:     Vomited blood:         Genitourinary    Burning when urinating:     Blood in urine:        Psychiatric    Major depression:         Hematologic    Bleeding problems:    Problems with blood clotting too easily:        Skin    Rashes or ulcers:        Constitutional    Fever or chills:     PHYSICAL EXAM:   Vitals:   01/24/18 1551  BP: 139/88  Pulse: 72  Resp: 16  Temp: (!) 96.9 F (36.1 C)  TempSrc: Oral  SpO2: 98%  Weight: 231 lb (104.8 kg)  Height: 5\' 6"  (1.676 m)   GENERAL: The patient is a well-nourished female, in no acute distress. The vital signs are documented above. CARDIAC: There is a regular rate and rhythm.  VASCULAR: I do not detect carotid bruits. She has palpable posterior tibial pulses bilaterally.  I did not palpate dorsalis pedis pulses. On the right side she has significant varicose veins along the medial aspect of her right calf which extend to her posterior calf.  She also has telangiectasias in her right thigh and right leg. On the left side she has telangiectasias in the thigh and calf anteriorly and posteriorly. She has mild bilateral lower extremity swelling. I did look at her right great saphenous vein myself with the SonoSite.  The saphenous vein is significantly dilated with reflux throughout this feeds a large cluster of varicose veins just above the knee. PULMONARY: There is good air exchange bilaterally without wheezing or rales. ABDOMEN: Soft and non-tender with normal pitched bowel sounds.  MUSCULOSKELETAL: There are no major deformities or cyanosis. NEUROLOGIC: No focal weakness or paresthesias are detected. SKIN: There are no ulcers or rashes noted. PSYCHIATRIC: The patient has a normal affect.  DATA:    VENOUS DUPLEX: I did review the venous duplex scan that was done on 12/10/2017.  On the right side, which is the more symptomatic side, there is no evidence of DVT or superficial venous  thrombosis.  There was reflux in the superficial system involving the great saphenous vein from the proximal calf to the groin.  There  was no significant deep venous reflux.  The vein on the right was significantly dilated.  On the left side there was no evidence of DVT or superficial thrombophlebitis.  There was reflux in the left great saphenous vein in the mid thigh and at the knee only.  MEDICAL ISSUES:   CHRONIC VENOUS INSUFFICIENCY: This patient has CEAP C-3 venous disease.  She has failed conservative treatment.  I think she is a good candidate for endovenous laser ablation of the right great saphenous vein with 10-20 stab phlebectomies. I have discussed the indications for endovenous laser ablation of the right GSV, that is to lower the pressure in the veins and potentially help relieve the symptoms from venous hypertension. I have also discussed alternative options including conservative treatment with leg elevation, compression therapy, exercise, avoiding prolonged sitting and standing, and weight management. I have discussed the potential complications of the procedure, including, but not limited to: bleeding, bruising, leg swelling, nerve injury, skin burns, significant pain from phlebitis, deep venous thrombosis, or failure of the vein to close.  I have also explained that venous insufficiency is a chronic disease, and that the patient is at risk for recurrent varicose veins in the future.  All of the patient's questions were encouraged and answered. They are agreeable to proceed.   I have discussed with the patient the indications for stab phlebectomy.  I have explained to the patient that that will have small scars from the stab incisions.  I explained that the other risks include leg swelling, bruising, bleeding, and phlebitis.  All the patient's questions were encouraged and answered and they are agreeable to proceed.  We will schedule her procedure in the near future.  Deitra Mayo [facility] [phone]

## 2018-01-25 DIAGNOSIS — I868 Varicose veins of other specified sites: Secondary | ICD-10-CM

## 2018-02-01 ENCOUNTER — Telehealth: Payer: Self-pay

## 2018-02-01 ENCOUNTER — Telehealth: Payer: Self-pay | Admitting: Family Medicine

## 2018-02-01 NOTE — Telephone Encounter (Signed)
Disregard

## 2018-02-01 NOTE — Telephone Encounter (Signed)
Rec'd from [facility] Latino forwarded 28 pages to Provider Historical MD Gertie Fey

## 2018-02-06 ENCOUNTER — Other Ambulatory Visit: Payer: Self-pay | Admitting: *Deleted

## 2018-02-06 DIAGNOSIS — I83813 Varicose veins of bilateral lower extremities with pain: Secondary | ICD-10-CM

## 2018-02-13 ENCOUNTER — Encounter: Payer: Self-pay | Admitting: Oncology

## 2018-02-13 ENCOUNTER — Telehealth: Payer: Self-pay | Admitting: Oncology

## 2018-02-13 NOTE — Telephone Encounter (Signed)
New referral received from Dr. Shelia Media for erythrocytosis. Pt has been cld and scheduled to see Dr. Alen Blew on 12/27 at [place] to arrive 30 minutes early. Letter mailed.

## 2018-02-15 ENCOUNTER — Encounter: Payer: Self-pay | Admitting: Gastroenterology

## 2018-02-28 ENCOUNTER — Ambulatory Visit (INDEPENDENT_AMBULATORY_CARE_PROVIDER_SITE_OTHER): Payer: PRIVATE HEALTH INSURANCE | Admitting: Vascular Surgery

## 2018-02-28 ENCOUNTER — Encounter: Payer: Self-pay | Admitting: Vascular Surgery

## 2018-02-28 VITALS — BP 132/80 | HR 72 | Temp 97.8°F | Resp 16 | Ht 67.0 in | Wt 208.0 lb

## 2018-02-28 DIAGNOSIS — I83811 Varicose veins of right lower extremities with pain: Secondary | ICD-10-CM | POA: Diagnosis not present

## 2018-02-28 DIAGNOSIS — I868 Varicose veins of other specified sites: Secondary | ICD-10-CM

## 2018-02-28 NOTE — Progress Notes (Signed)
Patient name: Caitlin Moran MRN: [id number] DOB: 1960/11/28 Sex: female  REASON FOR VISIT:   For endovenous laser ablation of the right great saphenous vein with 10-20 stab phlebectomies  HPI:   Caitlin Moran is a pleasant 57 y.o. female with significant venous insufficiency.  She had painful varicose veins of the right lower extremity and failed conservative treatment.  She presents for laser ablation of the right great saphenous vein and stab phlebectomies.  Current Outpatient Medications  Medication Sig Dispense Refill  . calcium carbonate (OS-CAL) 600 MG TABS tablet Take 600 mg by mouth daily with breakfast.    . celecoxib (CELEBREX) 200 MG capsule Take 200 mg by mouth daily.    . cholecalciferol (VITAMIN D) 1000 UNITS tablet Take 2,000 Units by mouth daily.     . DULoxetine (CYMBALTA) 60 MG capsule Take 60 mg by mouth daily.    . fish oil-omega-3 fatty acids 1000 MG capsule Take 2 g by mouth daily.    . Golimumab (SIMPONI [place]) Inject into the skin every 30 (thirty) days.    Marland Kitchen HYDROcodone-acetaminophen (NORCO) 7.5-325 MG tablet TK 1 T PO TID FOR 30 DAYS  0  . omeprazole (PRILOSEC) 20 MG capsule     . omeprazole (PRILOSEC) 40 MG capsule Take 40 mg by mouth daily.    . rosuvastatin (CRESTOR) 5 MG tablet Take 5 mg by mouth daily.    . Vitamin D, Ergocalciferol, (DRISDOL) 50000 units CAPS capsule Take by mouth.     No current facility-administered medications for this visit.     REVIEW OF SYSTEMS:  [X]  denotes positive finding, [ ]  denotes negative finding Vascular    Leg swelling    Cardiac    Chest pain or chest pressure:    Shortness of breath upon exertion:    Short of breath when lying flat:    Irregular heart rhythm:    Constitutional    Fever or chills:     PHYSICAL EXAM:   Vitals:   02/28/18 1413  BP: 132/80  Pulse: 72  Resp: 16  Temp: 97.8 F (36.6 C)  SpO2: 98%  Weight: 208 lb (94.3 kg)  Height: 5\' 7"  (1.702 m)    GENERAL: The patient is a  well-nourished female, in no acute distress. The vital signs are documented above.   DATA:   No new data  MEDICAL ISSUES:   LASER ABLATION RIGHT GREAT SAPHENOUS VEINS/10-20 STAB PHLEBECTOMIES: The patient was taken to the exam room.  The dilated veins were marked with the patient standing.  The patient was then placed supine.  The right leg was prepped and draped in usual sterile fashion.  Under ultrasound guidance, after the skin was anesthetized, the right great saphenous vein was cannulated just below the knee and a micropuncture sheath introduced over a wire.  I then advanced the J-wire to the saphenous vein just distal to the takeoff of the superficial epigastric vein.  The sheath was advanced to the same level and then the wire and dilator were removed.  The laser fiber was then positioned just distal to the superficial epigastric vein and the sheath was retracted.  The skin was anesthetized with pumps and lidocaine and then tumescent anesthesia was administered the entire length of the vein.  Patient was then placed in Trendelenburg.  Laser ablation was performed of the right great saphenous vein with a pullback rate of approximately 1 to 2 mm/s.  A total of 2943 J were administered to the  level of the knee.  Next tumescent anesthesia was infiltrated at all the sites that were marked with varicose veins.  Small stab incisions were made with the 11 blade.  Approximately 15 incisions were made.  The vein was retracted into above the incision and bluntly removed using a hemostat.  Pressure was held for hemostasis.  Sterile dressing was applied.  A pressure dressing was applied.  The patient tolerated the procedure well.  She will will return next week for venous duplex scan.  Deitra Mayo [facility] [phone]

## 2018-02-28 NOTE — Progress Notes (Signed)
     Laser Ablation Procedure    Date: 02/28/2018   Caitlin Moran DOB:1960-11-02  Consent signed: Yes    Surgeon:  Dr. Sherren Mocha Early  Procedure: Laser Ablation: right Greater Saphenous Vein  BP 132/80   Pulse 72   Temp 97.8 F (36.6 C)   Resp 16   Ht 5\' 7"  (1.702 m)   Wt 208 lb (94.3 kg)   LMP 09/24/2012   SpO2 98%   BMI 32.58 kg/m   Tumescent Anesthesia: 600 cc 0.9% NaCl with 50 cc Lidocaine HCL with 1% Epi and 15 cc 8.4% NaHCO3  Local Anesthesia: 9 cc Lidocaine HCL and NaHCO3 (ratio 2:1)  15 watts continuous mode        Total energy: 2943   Total time: 3:15    Stab Phlebectomy: 10-20 Sites: Calf  Patient tolerated procedure well  Notes:   Description of Procedure:  After marking the course of the secondary varicosities, the patient was placed on the operating table in the supine position, and the right leg was prepped and draped in sterile fashion.   Local anesthetic was administered and under ultrasound guidance the saphenous vein was accessed with a micro needle and guide wire; then the mirco puncture sheath was placed.  A guide wire was inserted saphenofemoral junction , followed by a 5 french sheath.  The position of the sheath and then the laser fiber below the junction was confirmed using the ultrasound.  Tumescent anesthesia was administered along the course of the saphenous vein using ultrasound guidance. The patient was placed in Trendelenburg position and protective laser glasses were placed on patient and staff, and the laser was fired at 15 watts continuous mode advancing 1-78mm/second for a total of 2943 joules.   For stab phlebectomies, local anesthetic was administered at the previously marked varicosities, and tumescent anesthesia was administered around the vessels.  Ten to 20 stab wounds were made using the tip of an 11 blade. And using the vein hook, the phlebectomies were performed using a hemostat to avulse the varicosities.  Adequate hemostasis was  achieved.     Steri strips were applied to the stab wounds and ABD pads and thigh high compression stockings were applied.  Ace wrap bandages were applied over the phlebectomy sites and at the top of the saphenofemoral junction. Blood loss was less than 15 cc.  The patient ambulated out of the operating room having tolerated the procedure well.

## 2018-03-07 ENCOUNTER — Ambulatory Visit (HOSPITAL_COMMUNITY)
Admission: RE | Admit: 2018-03-07 | Discharge: 2018-03-07 | Disposition: A | Discharge status: Home / Self Care | Payer: PRIVATE HEALTH INSURANCE | Source: Ambulatory Visit | Attending: Surgery | Admitting: Surgery

## 2018-03-07 ENCOUNTER — Encounter: Payer: Self-pay | Admitting: Surgery

## 2018-03-07 ENCOUNTER — Ambulatory Visit (INDEPENDENT_AMBULATORY_CARE_PROVIDER_SITE_OTHER): Payer: PRIVATE HEALTH INSURANCE | Admitting: Surgery

## 2018-03-07 ENCOUNTER — Other Ambulatory Visit: Payer: Self-pay

## 2018-03-07 VITALS — BP 126/78 | HR 70 | Temp 97.9°F | Resp 20 | Ht 67.0 in | Wt 208.0 lb

## 2018-03-07 DIAGNOSIS — I83811 Varicose veins of right lower extremities with pain: Secondary | ICD-10-CM | POA: Diagnosis not present

## 2018-03-07 DIAGNOSIS — I83813 Varicose veins of bilateral lower extremities with pain: Secondary | ICD-10-CM | POA: Diagnosis present

## 2018-03-07 NOTE — Progress Notes (Signed)
[facility] [place]  Patient name: Caitlin Moran MRN: [id number] DOB: Sep 24, 1960 Sex: female   REASON FOR VISIT:    Follow up  [place]:    Caitlin Moran is a 57 y.o. female who returns today for follow-up.  On 02/28/2018, she underwent laser ablation of the right great saphenous vein by Dr. Scot Dock.  She also had 10-20 stab phlebectomies performed of the calf.  She has done well since her procedure.  She did develop a contact dermatitis from the top part of her compression stockings for which she took steroids.   PAST MEDICAL HISTORY:   Past Medical History:  Diagnosis Date  . Anxiety   . Arthritis   . Depression   . GERD (gastroesophageal reflux disease)   . OSA (obstructive sleep apnea)    need to get CPAP  . PONV (postoperative nausea and vomiting)   . Renal calculus, right   . Right ureteral stone      FAMILY HISTORY:   Family History  Problem Relation Age of Onset  . Breast cancer Maternal Aunt     SOCIAL HISTORY:   Social History   Tobacco Use  . Smoking status: Former Smoker    Last attempt to quit: 11/05/1990    Years since quitting: 27.3  . Smokeless tobacco: Never Used  Substance Use Topics  . Alcohol use: Yes    Comment: twice a week     ALLERGIES:   No Known Allergies   CURRENT MEDICATIONS:   Current Outpatient Medications  Medication Sig Dispense Refill  . calcium carbonate (OS-CAL) 600 MG TABS tablet Take 600 mg by mouth daily with breakfast.    . celecoxib (CELEBREX) 200 MG capsule Take 200 mg by mouth daily.    . cholecalciferol (VITAMIN D) 1000 UNITS tablet Take 2,000 Units by mouth daily.     . clobetasol cream (TEMOVATE) 0.05 %     . DULoxetine (CYMBALTA) 60 MG capsule Take 60 mg by mouth daily.    . fish oil-omega-3 fatty acids 1000 MG capsule Take 2 g by mouth daily.    . Golimumab (SIMPONI [place]) Inject into the skin every 30 (thirty) days.      Marland Kitchen HYDROcodone-acetaminophen (NORCO) 7.5-325 MG tablet TK 1 T PO TID FOR 30 DAYS  0  . omeprazole (PRILOSEC) 20 MG capsule     . rosuvastatin (CRESTOR) 5 MG tablet Take 5 mg by mouth daily.     No current facility-administered medications for this visit.     REVIEW OF SYSTEMS:   [X]  denotes positive finding, [ ]  denotes negative finding Cardiac  Comments:  Chest pain or chest pressure:    Shortness of breath upon exertion:    Short of breath when lying flat:    Irregular heart rhythm:        Vascular    Pain in calf, thigh, or hip brought on by ambulation:    Pain in feet at night that wakes you up from your sleep:     Blood clot in your veins:    Leg swelling:         Pulmonary    Oxygen at home:    Productive cough:     Wheezing:         Neurologic    Sudden weakness in arms or legs:     Sudden numbness in arms or legs:     Sudden onset of difficulty speaking or slurred speech:  Temporary loss of vision in one eye:     Problems with dizziness:         Gastrointestinal    Blood in stool:     Vomited blood:         Genitourinary    Burning when urinating:     Blood in urine:        Psychiatric    Major depression:         Hematologic    Bleeding problems:    Problems with blood clotting too easily:        Skin    Rashes or ulcers:        Constitutional    Fever or chills:      PHYSICAL EXAM:   Vitals:   03/07/18 1052  BP: 126/78  Pulse: 70  Resp: 20  Temp: 97.9 F (36.6 C)  SpO2: 99%  Weight: 208 lb (94.3 kg)  Height: 5\' 7"  (1.702 m)    GENERAL: The patient is a well-nourished female, in no acute distress. The vital signs are documented above. CARDIAC: There is a regular rate and rhythm.  VASCULAR: Stab incisions are covered with Steri-Strips.  No significant drainage.  Mild residual spider veins PULMONARY: Non-labored respirations MUSCULOSKELETAL: There are no major deformities or cyanosis. NEUROLOGIC: No focal weakness or paresthesias  are detected. SKIN: There are no ulcers or rashes noted. PSYCHIATRIC: The patient has a normal affect.  STUDIES:   I have reviewed the ultrasound today which shows no evidence of DVT.  The saphenous vein is closed from the proximal calf up to the saphenofemoral junction.  MEDICAL ISSUES:   Status post laser ablation and stab phlebectomy of the right leg: The patient has recovered nicely from her procedure.  She did develop a reaction to the top part of her compression stocking for which she took steroids.  This potentially could be a contact dermatitis or latex allergy.  She is still wearing her compression stockings.  I told her that I suspect the spider veins will resolve over time following laser ablation.  She will contact us if she would like to discuss sclerotherapy.  She will also contact us if she has worsening symptoms of her left leg.    Annamarie Major, MD [facility] [phone] Pager [phone]

## 2018-03-08 ENCOUNTER — Inpatient Hospital Stay: Payer: PRIVATE HEALTH INSURANCE | Attending: Oncology | Admitting: Oncology

## 2018-03-11 ENCOUNTER — Encounter: Payer: Self-pay | Admitting: Vascular Surgery

## 2018-03-19 ENCOUNTER — Other Ambulatory Visit (INDEPENDENT_AMBULATORY_CARE_PROVIDER_SITE_OTHER): Payer: PRIVATE HEALTH INSURANCE

## 2018-03-19 ENCOUNTER — Encounter: Payer: Self-pay | Admitting: Gastroenterology

## 2018-03-19 ENCOUNTER — Ambulatory Visit (INDEPENDENT_AMBULATORY_CARE_PROVIDER_SITE_OTHER): Payer: PRIVATE HEALTH INSURANCE | Admitting: Gastroenterology

## 2018-03-19 VITALS — BP 128/90 | HR 72 | Ht 67.0 in | Wt 238.0 lb

## 2018-03-19 DIAGNOSIS — K219 Gastro-esophageal reflux disease without esophagitis: Secondary | ICD-10-CM

## 2018-03-19 DIAGNOSIS — R1011 Right upper quadrant pain: Secondary | ICD-10-CM

## 2018-03-19 DIAGNOSIS — K649 Unspecified hemorrhoids: Secondary | ICD-10-CM

## 2018-03-19 DIAGNOSIS — K625 Hemorrhage of anus and rectum: Secondary | ICD-10-CM

## 2018-03-19 DIAGNOSIS — Z86010 Personal history of colonic polyps: Secondary | ICD-10-CM

## 2018-03-19 LAB — IBC PANEL
Iron: 156 ug/dL — ABNORMAL HIGH (ref 42–145)
Saturation Ratios: 37.9 % (ref 20.0–50.0)
Transferrin: 294 mg/dL (ref 212.0–360.0)

## 2018-03-19 LAB — HEPATIC FUNCTION PANEL
ALT: 15 U/L (ref 0–35)
AST: 14 U/L (ref 0–37)
Albumin: 4.2 g/dL (ref 3.5–5.2)
Alkaline Phosphatase: 84 U/L (ref 39–117)
Bilirubin, Direct: 0.1 mg/dL (ref 0.0–0.3)
Total Bilirubin: 0.8 mg/dL (ref 0.2–1.2)
Total Protein: 7.2 g/dL (ref 6.0–8.3)

## 2018-03-19 LAB — AMYLASE: Amylase: 28 U/L (ref 27–131)

## 2018-03-19 LAB — BASIC METABOLIC PANEL
BUN: 24 mg/dL — ABNORMAL HIGH (ref 6–23)
CO2: 31 mEq/L (ref 19–32)
Calcium: 9.7 mg/dL (ref 8.4–10.5)
Chloride: 104 mEq/L (ref 96–112)
Creatinine, Ser: 0.79 mg/dL (ref 0.40–1.20)
GFR: 79.97 mL/min (ref >60.00)
Glucose, Bld: 91 mg/dL (ref 70–99)
Potassium: 4.1 mEq/L (ref 3.5–5.1)
Sodium: 141 mEq/L (ref 135–145)

## 2018-03-19 LAB — CBC
HCT: 47.4 % — ABNORMAL HIGH (ref 36.0–46.0)
Hemoglobin: 16.1 g/dL — ABNORMAL HIGH (ref 12.0–15.0)
MCHC: 34 g/dL (ref 30.0–36.0)
MCV: 89.5 fl (ref 78.0–100.0)
Platelets: 287 10*3/uL (ref 150.0–400.0)
RBC: 5.3 Mil/uL — ABNORMAL HIGH (ref 3.87–5.11)
RDW: 12.9 % (ref 11.5–15.5)
WBC: 7.4 10*3/uL (ref 4.0–10.5)

## 2018-03-19 LAB — PROTIME-INR
INR: 0.9 ratio (ref 0.8–1.0)
Prothrombin Time: 11.1 s (ref 9.6–13.1)

## 2018-03-19 LAB — LIPASE: Lipase: 36 U/L (ref 11.0–59.0)

## 2018-03-19 LAB — FERRITIN: Ferritin: 49.3 ng/mL (ref 10.0–291.0)

## 2018-03-19 MED ORDER — PEG 3350-KCL-NA BICARB-NACL 420 G PO SOLR: 4000.0000 mL | ORAL | 0 refills | Status: DC

## 2018-03-19 NOTE — Progress Notes (Signed)
Andrew VISIT   Primary Care Provider Deland Pretty, [place] [address] [phone]  Referring Provider Deland Pretty, MD [address] [place], [place] [address] [phone]  Patient Profile: Caitlin Moran is a 58 y.o. female with a pmh significant for Anxiety/MDD, GERD, OSA, OA, s/p CCK, Kidney stones.  The patient presents to the [facility] for an evaluation and management of problem(s) noted below:  Problem List 1. RUQ pain   2. Gastroesophageal reflux disease, esophagitis presence not specified   3. Rectal bleeding   4. Hemorrhoids, unspecified hemorrhoid type   5. History of colonic polyps     History of Present Illness: This is the patient's first visit to the GI clinic of [place].  The patient had a CCK in 1992.  She remembers having severe, gnawing pain in the region.  Told she had lots of stones at the time.  She did well for years.  No issues with pancreatitis ever.  She began to experience what was thought to be acid reflux and began taking ant-acids in efforts of helping this.  Every few years she would have symptoms of heartburn occur.  These would normally last 1-2 weeks.  [facility] patient however began to experience a few years ago recurrent RUQ abdominal pain.,  In 2018, the patient had an U/S that suggested a possible stone in the bile duct with dilation.  This was followed up with a MRI/MRCP that was negative, and this was ordered by her prior gastroenterologist.  Plan had been to consider an EGD/EUS but symptoms improved.  She did not have her EGD due to issues of insurance.  She was told that she needed a follow up colonoscopy as well in 5-years, which would have been due in 2019.  She had improvement of pain for the next few months.  The patient then had recurrence in November 2019.  Pain persisted, but she did not go to her MD and did not have any labs obtained.  This  lasted for about 2-2.5 weeks.  Pain similar to her report as to her prior gallbladder pain.  She does not take NSAIDs or BC/Goodie powders.  Currently, the patient feels well, but is concerned if this occurs again.  GI Review of Systems Positive as above including infrequent pyrosis Negative for dysphagia, odynophagia, change in bowel habits, melena, hematochezia.  Review of Systems General: Denies fevers/chills/weight loss HEENT: Denies oral lesions Cardiovascular: Denies chest pain/palpitations Pulmonary: Denies shortness of breath Gastroenterological: See HPI Genitourinary: Denies darkened urine Hematological: Denies easy bruising Dermatological: Denies jaundice Psychological: Mood is stable but anxious if this occurs again   Medications Current Outpatient Medications  Medication Sig Dispense Refill  . calcium carbonate (OS-CAL) 600 MG TABS tablet Take 600 mg by mouth daily with breakfast.    . celecoxib (CELEBREX) 200 MG capsule Take 200 mg by mouth daily.    . cholecalciferol (VITAMIN D) 1000 UNITS tablet Take 2,000 Units by mouth daily.     . clobetasol cream (TEMOVATE) 0.05 %     . fish oil-omega-3 fatty acids 1000 MG capsule Take 2 g by mouth daily.    . Golimumab (SIMPONI [place]) Inject into the skin every 30 (thirty) days.    Marland Kitchen HYDROcodone-acetaminophen (NORCO) 7.5-325 MG tablet TK 1 T PO TID FOR 30 DAYS  0  . omeprazole (PRILOSEC) 20 MG capsule     . rosuvastatin (CRESTOR) 5 MG tablet Take 5 mg by mouth  daily.    . vortioxetine HBr (TRINTELLIX) 20 MG TABS tablet Take 20 mg by mouth daily.    . polyethylene glycol-electrolytes (NULYTELY/GOLYTELY) 420 g solution Take 4,000 mLs by mouth as directed. 4000 mL 0   No current facility-administered medications for this visit.     Allergies No Known Allergies  Histories Past Medical History:  Diagnosis Date  . Anxiety   . Arthritis   . Depression   . GERD (gastroesophageal reflux disease)   . OSA (obstructive sleep apnea)     need to get CPAP  . PONV (postoperative nausea and vomiting)   . Renal calculus, right   . Right ureteral stone    Past Surgical History:  Procedure Laterality Date  . ABLATION     unquestionable  . CESAREAN SECTION    . CHOLECYSTECTOMY    . EXTRACORPOREAL SHOCK WAVE LITHOTRIPSY  02/14/2010  . HYSTEROSCOPY W/D&C N/A 11/27/2012   Procedure: DILATATION AND CURETTAGE /HYSTEROSCOPY;  Surgeon: Marylynn Pearson, MD;  Location: [facility];  Service: Gynecology;  Laterality: N/A;  . WISDOM TOOTH EXTRACTION     Social History   Socioeconomic History  . Marital status: Married    Spouse name: Not on file  . Number of children: 2  . Years of education: Not on file  . Highest education level: Not on file  Occupational History  . Occupation: Therapist, sports  Social Needs  . Financial resource strain: Not on file  . Food insecurity:    Worry: Not on file    Inability: Not on file  . Transportation needs:    Medical: Not on file    Non-medical: Not on file  Tobacco Use  . Smoking status: Former Smoker    Last attempt to quit: 11/05/1990    Years since quitting: 27.3  . Smokeless tobacco: Never Used  Substance and Sexual Activity  . Alcohol use: Yes    Comment: twice a week  . Drug use: No  . Sexual activity: Not on file  Lifestyle  . Physical activity:    Days per week: Not on file    Minutes per session: Not on file  . Stress: Not on file  Relationships  . Social connections:    Talks on phone: Not on file    Gets together: Not on file    Attends religious service: Not on file    Active member of club or organization: Not on file    Attends meetings of clubs or organizations: Not on file    Relationship status: Not on file  . Intimate partner violence:    Fear of current or ex partner: Not on file    Emotionally abused: Not on file    Physically abused: Not on file    Forced sexual activity: Not on file  Other Topics Concern  . Not on file  Social History Narrative  . Not on file    Family History  Problem Relation Age of Onset  . Breast cancer Maternal Aunt   . Esophageal cancer Mother   . Lung cancer Father   . Colon cancer Neg Hx   . Inflammatory bowel disease Neg Hx   . Liver disease Neg Hx   . Pancreatic cancer Neg Hx   . Stomach cancer Neg Hx   . Rectal cancer Neg Hx    I have reviewed her medical, social, and family history in detail and updated the electronic medical record as necessary.    PHYSICAL EXAMINATION  BP 128/90  Pulse 72   Ht 5\' 7"  (1.702 m)   Wt 238 lb (108 kg)   LMP 09/24/2012   BMI 37.28 kg/m  Wt Readings from Last 3 Encounters:  03/19/18 238 lb (108 kg)  03/07/18 208 lb (94.3 kg)  02/28/18 208 lb (94.3 kg)  GEN: NAD, appears stated age, doesn't appear chronically ill PSYCH: Cooperative, without pressured speech EYE: Conjunctivae pink, sclerae anicteric ENT: MMM, without oral ulcers, no erythema or exudates noted NECK: Supple CV: RR without R/Gs  RESP: CTAB posteriorly, without wheezing GI: NABS, soft, minimal TTP in the RUQ/Rt Flank region, ND, surgical scars appreciated, unable to appreciate HSM MSK/EXT: No LE edema SKIN: No jaundice NEURO:  Alert & Oriented x 3, no focal deficits   REVIEW OF DATA  I reviewed the following data at the time of this encounter:  GI Procedures and Studies  2012 colonoscopy (indication hematochezia and family history of colon polyps in sister) Perianal exam was abnormal showing nonthrombosed external hemorrhoids Normal colon without polyps, mass, vascular ectasias, inflammatory changes, diverticula presents.  Retroflexed view of the distal rectum and anal verge was normal and showed no anal or rectal abnormalities.  The distal 5 cm of terminal ileum appeared normal  Laboratory Studies  Reviewed in epic  Imaging Studies  Reviewed in epic  Outside Records  02/21/2017 [facility] outpatient clinic Patient returning to clinic after a month of severe acute right upper quadrant pain.   Abdominal ultrasound noted in November to show a dilated common bile duct of 9.8 with possible stone but follow-up MRI/MRCP did not show any stones or bile duct dilation.  She was found to have incidentally noted kidney stones and has a primary urologist.  EGD was canceled and EUS was recommended if persistent right upper quadrant pain recur but by the time of this visit it looks like her discomfort had improved.  Notation suggested that she needed a 5-year repeat colonoscopy due to family history of colon polyps.  At the end of this the assessment suggested that this was a passed bile duct stone and with occasional epigastric discomfort an endoscopy and colonoscopy were wanted however the did not get scheduled.  There is concern for irritable bowel syndrome with constipation/diarrhea which dicyclomine was started.  She had severe recurrent right upper quadrant pain and then recommendation for EUS ERCP was made.  November 2018 [facility] note initial evaluation for the presentation of her pain and ultrasound was performed  January 2012 outpatient clinic note [facility] Here to establish care due to hemorrhoids and need for colonoscopy with family history of colon polyps as well  November 2018 labs Amylase 36 Lipase 43 White count 6.7 Hemoglobin 15.9 Platelets 279 MCV 90.3 AST/ALT 17/21 Total bilirubin 0.6 Alkaline phosphatase 90 Albumin 4.2 Sodium 144 Potassium 4.1 BUN 15 Creatinine 0.8 Total cholesterol 189 HDL 45 LDL 106 Triglycerides 191 Urinalysis showed negative nitrites moderate leukocytes negative bilirubin negative ketones small blood Vitamin D 2543.9   ASSESSMENT  Caitlin Moran is a 58 y.o. female with a pmh significant for Anxiety/MDD, GERD, OSA, OA, s/p CCK, Kidney stones.   The patient is seen today for evaluation and management of:  1. RUQ pain   2. Gastroesophageal reflux disease, esophagitis presence not specified   3. Rectal bleeding   4. Hemorrhoids, unspecified  hemorrhoid type   5. History of colonic polyps    Patient is clinically stable.  The patient's discomfort is not the most typical for true biliary colic however  she is very adamant that is similar to what she had time of her cholecystectomy.  She goes many months without issues but over the course the last year and a half she has had multiple few episodes of this right upper quadrant discomfort.  Currently she is doing well.  The possibility of a passed stone in the setting of ultrasound imaging showing dilation and then subsequently an MRI CP showing no overt dilation would be suggestive of possible previously passed stone.  Small stones have a lower sensitivity of being found on MRI/MRCP.  As such I think it is reasonable to evaluate things with an endoscopic ultrasound which we will plan on scheduling.  She will be due for colon cancer screening per her prior documentation as a result of her family history of colon polyps and her preparation from the last colonoscopy.  Most likely etiology of rectal bleeding is hemorrhoidal as she has been told this in the past but we will be able to evaluate and ensure nothing else is occurring with a colonoscopy as well.  The risks of EUS including bleeding, infection, aspiration pneumonia and intestinal perforation were discussed as was the possibility it may not give a definitive diagnosis.  If a biopsy of the pancreas is done as part of the EUS, there is an additional risk of pancreatitis at the rate of about 1%.  It was explained that procedure related pancreatitis is typically mild, although can be severe and even life threatening, which is why we do not perform random pancreatic biopsies and only biopsy a lesion we feel is concerning enough to warrant the risk.  The risks and benefits of endoscopic evaluation were discussed with the patient; these include but are not limited to the risk of perforation, infection, bleeding, missed lesions, lack of diagnosis, severe  illness requiring hospitalization, as well as anesthesia and sedation related illnesses.  The patient is agreeable to proceed.  All patient questions were answered, to the best of my ability, and the patient agrees to the aforementioned plan of action with follow-up as indicated.   PLAN  Laboratories as outlined below Plan for endoscopic ultrasound to evaluate the biliary tree and ensure that she does not have any evidence of choledocholithiasis or biliary sludge that would require an ERCP A colonoscopy will need to be performed in the course of the next few months to get her up-to-date for colon cancer screening in the setting of family history and last colonoscopy suggesting a 5-year follow-up by outside provider) May utilize Ursa supplementation as well as Colace and MiraLAX on an every other day basis as necessary to optimize her stool output   Patient to return for labs (amylase/lipase/HFP as future orders) if she has recurrent abdominal pain within 6-10 hours of onset if she is stable   Orders Placed This Encounter  Procedures  . CBC  . Basic metabolic panel  . Amylase  . Lipase  . Hepatic function panel  . Protime-INR  . Amylase  . Lipase  . Hepatic function panel  . Iron  . Iron Binding Cap (TIBC)  . Ferritin  . Reticulocytes  . Ambulatory referral to Gastroenterology  . Ambulatory referral to Gastroenterology    New Prescriptions   POLYETHYLENE GLYCOL-ELECTROLYTES (NULYTELY/GOLYTELY) 420 G SOLUTION    Take 4,000 mLs by mouth as directed.   Modified Medications   No medications on file    Planned Follow Up: No follow-ups on file.   Justice Britain, MD [facility] Office #  [phone]  

## 2018-03-19 NOTE — Patient Instructions (Signed)
Your provider has requested that you go to the basement level for lab work before leaving today. Press "B" on the elevator. The lab is located at the first door on the left as you exit the elevator.  You have been scheduled for an EUS at [facility], and colonoscopy at our facility. Please follow the written instructions given to you at your visit today. Please pick up your prep supplies at the pharmacy within the next 1-3 days. If you use inhalers (even only as needed), please bring them with you on the day of your procedure. Your physician has requested that you go to www.startemmi.com and enter the access code given to you at your visit today. This web site gives a general overview about your procedure. However, you should still follow specific instructions given to you by our office regarding your preparation for the procedure.  Thank you for entrusting me with your care and choosing [facility].  Dr Rush Landmark

## 2018-03-20 ENCOUNTER — Telehealth: Payer: Self-pay | Admitting: Gastroenterology

## 2018-03-20 LAB — RETICULOCYTES
ABS Retic: 90440 cells/uL — ABNORMAL HIGH (ref 20000–8000)
Retic Ct Pct: 1.7 %

## 2018-03-20 NOTE — Telephone Encounter (Signed)
Pt returned your call regarding lab results. Pls call her again. °

## 2018-03-21 ENCOUNTER — Encounter: Payer: Self-pay | Admitting: Gastroenterology

## 2018-03-21 DIAGNOSIS — R1011 Right upper quadrant pain: Secondary | ICD-10-CM | POA: Insufficient documentation

## 2018-03-21 DIAGNOSIS — K625 Hemorrhage of anus and rectum: Secondary | ICD-10-CM | POA: Insufficient documentation

## 2018-03-21 DIAGNOSIS — Z86010 Personal history of colonic polyps: Secondary | ICD-10-CM | POA: Insufficient documentation

## 2018-03-21 DIAGNOSIS — K649 Unspecified hemorrhoids: Secondary | ICD-10-CM | POA: Insufficient documentation

## 2018-03-22 ENCOUNTER — Encounter

## 2018-03-22 ENCOUNTER — Encounter (INDEPENDENT_AMBULATORY_CARE_PROVIDER_SITE_OTHER): Payer: PRIVATE HEALTH INSURANCE

## 2018-03-22 ENCOUNTER — Ambulatory Visit (INDEPENDENT_AMBULATORY_CARE_PROVIDER_SITE_OTHER): Payer: PRIVATE HEALTH INSURANCE | Admitting: Vascular Surgery

## 2018-03-22 ENCOUNTER — Telehealth: Payer: Self-pay | Admitting: Oncology

## 2018-03-22 NOTE — Telephone Encounter (Signed)
Returned call re rescheduling appointment. Gave patient new appointment for 1/24 @ 11 am (new heme).

## 2018-03-25 ENCOUNTER — Telehealth: Payer: Self-pay | Admitting: Gastroenterology

## 2018-03-25 NOTE — Telephone Encounter (Signed)
Pt request refill of medication omprazole sent [facility] pharmacy.Marland KitchenMarland Kitchen

## 2018-03-26 NOTE — Telephone Encounter (Signed)
Left patient a message that this medication can be purchased OTC. She can pick up at any local drug store.

## 2018-03-28 ENCOUNTER — Encounter: Payer: Self-pay | Admitting: Neurology

## 2018-04-02 ENCOUNTER — Institutional Professional Consult (permissible substitution): Payer: PRIVATE HEALTH INSURANCE | Admitting: Neurology

## 2018-04-02 ENCOUNTER — Encounter

## 2018-04-02 ENCOUNTER — Telehealth: Payer: Self-pay | Admitting: Neurology

## 2018-04-02 NOTE — Telephone Encounter (Signed)
Patient called today and cancelled her apt for sleep consult. This is a no show.

## 2018-04-03 ENCOUNTER — Encounter: Payer: Self-pay | Admitting: Gastroenterology

## 2018-04-05 ENCOUNTER — Telehealth: Payer: Self-pay | Admitting: Oncology

## 2018-04-05 ENCOUNTER — Inpatient Hospital Stay: Payer: PRIVATE HEALTH INSURANCE | Attending: Oncology | Admitting: Oncology

## 2018-04-05 ENCOUNTER — Inpatient Hospital Stay: Payer: PRIVATE HEALTH INSURANCE

## 2018-04-05 ENCOUNTER — Encounter: Payer: Self-pay | Admitting: Oncology

## 2018-04-05 VITALS — BP 128/66 | HR 73 | Temp 98.2°F | Resp 17 | Ht 67.0 in | Wt 241.2 lb

## 2018-04-05 DIAGNOSIS — K219 Gastro-esophageal reflux disease without esophagitis: Secondary | ICD-10-CM | POA: Diagnosis not present

## 2018-04-05 DIAGNOSIS — Z87442 Personal history of urinary calculi: Secondary | ICD-10-CM | POA: Insufficient documentation

## 2018-04-05 DIAGNOSIS — Z87891 Personal history of nicotine dependence: Secondary | ICD-10-CM

## 2018-04-05 DIAGNOSIS — L4050 Arthropathic psoriasis, unspecified: Secondary | ICD-10-CM | POA: Insufficient documentation

## 2018-04-05 DIAGNOSIS — D45 Polycythemia vera: Secondary | ICD-10-CM

## 2018-04-05 DIAGNOSIS — Z79899 Other long term (current) drug therapy: Secondary | ICD-10-CM

## 2018-04-05 DIAGNOSIS — E669 Obesity, unspecified: Secondary | ICD-10-CM

## 2018-04-05 DIAGNOSIS — R5383 Other fatigue: Secondary | ICD-10-CM | POA: Insufficient documentation

## 2018-04-05 DIAGNOSIS — K589 Irritable bowel syndrome without diarrhea: Secondary | ICD-10-CM | POA: Diagnosis not present

## 2018-04-05 DIAGNOSIS — F329 Major depressive disorder, single episode, unspecified: Secondary | ICD-10-CM | POA: Diagnosis not present

## 2018-04-05 DIAGNOSIS — R51 Headache: Secondary | ICD-10-CM | POA: Diagnosis not present

## 2018-04-05 DIAGNOSIS — G4730 Sleep apnea, unspecified: Secondary | ICD-10-CM | POA: Insufficient documentation

## 2018-04-05 LAB — CBC WITH DIFFERENTIAL (CANCER CENTER ONLY)
Abs Immature Granulocytes: 0.01 10*3/uL (ref 0.00–0.07)
Basophils Absolute: 0.1 10*3/uL (ref 0.0–0.1)
Basophils Relative: 1 %
Eosinophils Absolute: 0.2 10*3/uL (ref 0.0–0.5)
Eosinophils Relative: 4 %
HCT: 46.2 % — ABNORMAL HIGH (ref 36.0–46.0)
Hemoglobin: 15.5 g/dL — ABNORMAL HIGH (ref 12.0–15.0)
Immature Granulocytes: 0 %
Lymphocytes Relative: 31 %
Lymphs Abs: 2 10*3/uL (ref 0.7–4.0)
MCH: 30.1 pg (ref 26.0–34.0)
MCHC: 33.5 g/dL (ref 30.0–36.0)
MCV: 89.7 fL (ref 80.0–100.0)
Monocytes Absolute: 0.4 10*3/uL (ref 0.1–1.0)
Monocytes Relative: 7 %
Neutro Abs: 3.6 10*3/uL (ref 1.7–7.7)
Neutrophils Relative %: 57 %
Platelet Count: 247 10*3/uL (ref 150–400)
RBC: 5.15 MIL/uL — ABNORMAL HIGH (ref 3.87–5.11)
RDW: 12.4 % (ref 11.5–15.5)
WBC Count: 6.3 10*3/uL (ref 4.0–10.5)
nRBC: 0 % (ref 0.0–0.2)

## 2018-04-05 NOTE — Telephone Encounter (Signed)
Scheduled appt per 01/24 los.  Lab today.  No MD follow up for now.   Printed avs.

## 2018-04-05 NOTE — Progress Notes (Signed)
Reason for the request:    Erythrocytosis.  HPI: I was asked by Dr. Shelia Media to evaluate Caitlin Moran for elevated hemoglobin.  She is 58 year old woman with history of psoriatic arthritis, sleep apnea and mild obesity who was found to have an elevated hemoglobin dating back to 2011.  At that time her hemoglobin was 15 and a CBC in 2014 showed a hemoglobin of 15.1.  On March 19, 2018 her hemoglobin was 16.1, hematocrit 47 with normal white cell count and platelet count.  She is currently under evaluation for sleep apnea but she has not received any treatment at this time.  She has not started CPAP as well.  She has been told as a child that she has polycythemia although no further work-up or diagnosis has been made.  He is otherwise asymptomatic except for some mild fatigue and occasional headaches.  She denies any dyspnea on exertion or palpitation.  Performance status activity level remains unchanged.  She denies any venous thrombosis episodes but did have superficial phlebitis.  She does not report any headaches, blurry vision, syncope or seizures. Does not report any fevers, chills or sweats.  Does not report any cough, wheezing or hemoptysis.  Does not report any chest pain, palpitation, orthopnea or leg edema.  Does not report any nausea, vomiting or abdominal pain.  Does not report any constipation or diarrhea.  Does not report any skeletal complaints.    Does not report frequency, urgency or hematuria.  Does not report any skin rashes or lesions. Does not report any heat or cold intolerance.  Does not report any lymphadenopathy or petechiae.  Does not report any anxiety or depression.  Remaining review of systems is negative.    Past Medical History:  Diagnosis Date  . Anxiety   . Arthritis   . Depression   . GERD (gastroesophageal reflux disease)   . Headache   . IBS (irritable bowel syndrome)   . OSA (obstructive sleep apnea)    need to get CPAP  . PONV (postoperative nausea and vomiting)    . Renal calculus, right   . Right ureteral stone   :  Past Surgical History:  Procedure Laterality Date  . ABLATION     unquestionable  . CESAREAN SECTION    . CHOLECYSTECTOMY    . EXTRACORPOREAL SHOCK WAVE LITHOTRIPSY  02/14/2010  . HYSTEROSCOPY W/D&C N/A 11/27/2012   Procedure: DILATATION AND CURETTAGE /HYSTEROSCOPY;  Surgeon: Marylynn Pearson, MD;  Location: [facility];  Service: Gynecology;  Laterality: N/A;  . WISDOM TOOTH EXTRACTION    :   Current Outpatient Medications:  .  calcium carbonate (OS-CAL) 600 MG TABS tablet, Take 600 mg by mouth daily with breakfast., Disp: , Rfl:  .  celecoxib (CELEBREX) 200 MG capsule, Take 200 mg by mouth daily., Disp: , Rfl:  .  cholecalciferol (VITAMIN D) 1000 UNITS tablet, Take 2,000 Units by mouth daily. , Disp: , Rfl:  .  clobetasol cream (TEMOVATE) 0.05 %, , Disp: , Rfl:  .  fish oil-omega-3 fatty acids 1000 MG capsule, Take 2 g by mouth daily., Disp: , Rfl:  .  Golimumab (SIMPONI [place]), Inject into the skin every 30 (thirty) days., Disp: , Rfl:  .  HYDROcodone-acetaminophen (NORCO) 7.5-325 MG tablet, TK 1 T PO TID FOR 30 DAYS, Disp: , Rfl: 0 .  omeprazole (PRILOSEC) 20 MG capsule, , Disp: , Rfl:  .  polyethylene glycol-electrolytes (NULYTELY/GOLYTELY) 420 g solution, Take 4,000 mLs by mouth as directed., Disp: 4000 mL, Rfl:  0 .  rosuvastatin (CRESTOR) 5 MG tablet, Take 5 mg by mouth daily., Disp: , Rfl:  .  vortioxetine HBr (TRINTELLIX) 20 MG TABS tablet, Take 20 mg by mouth daily., Disp: , Rfl: :  No Known Allergies:  Family History  Problem Relation Age of Onset  . Breast cancer Maternal Aunt   . Esophageal cancer Mother   . Lung cancer Father   . CAD Father   . Colon cancer Neg Hx   . Inflammatory bowel disease Neg Hx   . Liver disease Neg Hx   . Pancreatic cancer Neg Hx   . Stomach cancer Neg Hx   . Rectal cancer Neg Hx   :  Social History   Socioeconomic History  . Marital status: Married    Spouse name: Not on file  .  Number of children: 2  . Years of education: Not on file  . Highest education level: Not on file  Occupational History  . Occupation: Therapist, sports  Social Needs  . Financial resource strain: Not on file  . Food insecurity:    Worry: Not on file    Inability: Not on file  . Transportation needs:    Medical: Not on file    Non-medical: Not on file  Tobacco Use  . Smoking status: Former Smoker    Last attempt to quit: 11/05/1990    Years since quitting: 27.4  . Smokeless tobacco: Never Used  Substance and Sexual Activity  . Alcohol use: Yes    Comment: twice a week  . Drug use: No  . Sexual activity: Not on file  Lifestyle  . Physical activity:    Days per week: Not on file    Minutes per session: Not on file  . Stress: Not on file  Relationships  . Social connections:    Talks on phone: Not on file    Gets together: Not on file    Attends religious service: Not on file    Active member of club or organization: Not on file    Attends meetings of clubs or organizations: Not on file    Relationship status: Not on file  . Intimate partner violence:    Fear of current or ex partner: Not on file    Emotionally abused: Not on file    Physically abused: Not on file    Forced sexual activity: Not on file  Other Topics Concern  . Not on file  Social History Narrative  . Not on file  :  Pertinent items are noted in HPI.  Exam: Blood pressure 128/66, pulse 73, temperature 98.2 F (36.8 C), temperature source Oral, resp. rate 17, height 5\' 7"  (1.702 m), weight 241 lb 3.2 oz (109.4 kg), last menstrual period 09/24/2012, SpO2 100 %.  ECOG 0 General appearance: alert and cooperative appeared without distress. Head: atraumatic without any abnormalities. Eyes: conjunctivae/corneas clear. PERRL.  Sclera anicteric. Throat: lips, mucosa, and tongue normal; without oral thrush or ulcers. Resp: clear to auscultation bilaterally without rhonchi, wheezes or dullness to percussion. Cardio: regular  rate and rhythm, S1, S2 normal, no murmur, click, rub or gallop GI: soft, non-tender; bowel sounds normal; no masses,  no organomegaly Skin: Skin color, texture, turgor normal. No rashes or lesions Lymph nodes: Cervical, supraclavicular, and axillary nodes normal. Neurologic: Grossly normal without any motor, sensory or deep tendon reflexes. Musculoskeletal: No joint deformity or effusion.  Recent Labs    04/05/18 1137  WBC 6.3  HGB 15.5*  HCT 46.2*  PLT 247        Assessment and Plan:    58 year old woman with the following:  1.  Polycythemia noted since 2011 with mild elevation in her hemoglobin in the setting of sleep apnea that is not treated at this time.  Repeated CBC on April 05, 2018 showed a hemoglobin of 15.5 which is slightly elevated with a hematocrit of 46.  The differential diagnosis and management options were reviewed today in detail.  Secondary causes of polycythemia is likely the cause of elevation in her hemoglobin.  Sleep apnea, mild obesity and possible dehydration could be the cause.  Polycythemia vera would be considered less likely but given the history that she is providing evaluating for myeloproliferative disorder and polycythemia vera will be necessary.  From a management standpoint, I will obtain genetic testing to evaluate for myeloproliferative disorder as well as erythropoietin level.  If no myeloproliferative disorder is detected, I do not recommend any further work-up at that time.  She does not require any phlebotomy given her level of hemoglobin and hematocrit and likely treating her sleep apnea should improve her hemoglobin close to normal range.  2.  Thrombosis prophylaxis: I recommended low-dose aspirin to prevent vascular thrombosis.  3.  Follow-up: To be determined depending on her work-up.  If she has no evidence of a myeloproliferative disorder, no further hematology follow-up will be needed.  40  minutes was spent with the patient  face-to-face today.  More than 50% of time was dedicated to reviewing patient's labs, differential diagnosis and discussing management options   Thank you for the referral..  A copy of this consult has been forwarded to the requesting physician.

## 2018-04-06 LAB — ERYTHROPOIETIN: Erythropoietin: 10.8 m[IU]/mL (ref 2.6–18.5)

## 2018-04-08 ENCOUNTER — Telehealth: Payer: Self-pay | Admitting: Gastroenterology

## 2018-04-08 NOTE — Telephone Encounter (Signed)
The pt was rescheduled to 2/19. Pt aware

## 2018-04-11 ENCOUNTER — Telehealth: Payer: Self-pay | Admitting: *Deleted

## 2018-04-11 ENCOUNTER — Encounter: Payer: PRIVATE HEALTH INSURANCE | Admitting: Gastroenterology

## 2018-04-11 NOTE — Telephone Encounter (Signed)
Called. LM on patient identified VM, that all of her labs are normal.

## 2018-04-12 ENCOUNTER — Other Ambulatory Visit: Payer: PRIVATE HEALTH INSURANCE

## 2018-04-12 DIAGNOSIS — K625 Hemorrhage of anus and rectum: Secondary | ICD-10-CM

## 2018-04-12 DIAGNOSIS — K219 Gastro-esophageal reflux disease without esophagitis: Secondary | ICD-10-CM

## 2018-04-12 DIAGNOSIS — K649 Unspecified hemorrhoids: Secondary | ICD-10-CM

## 2018-04-12 DIAGNOSIS — R1011 Right upper quadrant pain: Secondary | ICD-10-CM

## 2018-04-12 DIAGNOSIS — Z86010 Personal history of colonic polyps: Secondary | ICD-10-CM

## 2018-04-12 LAB — IRON, TOTAL/TOTAL IRON BINDING CAP
%SAT: 32 % (calc) (ref 16–45)
Iron: 120 ug/dL (ref 45–160)
TIBC: 380 mcg/dL (calc) (ref 250–450)

## 2018-04-15 ENCOUNTER — Telehealth: Payer: Self-pay | Admitting: Gastroenterology

## 2018-04-15 NOTE — Telephone Encounter (Signed)
The pt was advised no sooner appt available.  She will keep appt as planned

## 2018-04-15 NOTE — Telephone Encounter (Signed)
Pt wants to know if there is availability for her proc at [facility] before 2/19.

## 2018-04-18 LAB — JAK2 (INCLUDING V617F AND EXON 12), MPL,& CALR-NEXT GEN SEQ

## 2018-04-30 ENCOUNTER — Other Ambulatory Visit: Payer: Self-pay

## 2018-04-30 ENCOUNTER — Encounter (HOSPITAL_COMMUNITY): Payer: Self-pay | Admitting: *Deleted

## 2018-05-01 ENCOUNTER — Ambulatory Visit (HOSPITAL_COMMUNITY)
Admission: RE | Admit: 2018-05-01 | Discharge: 2018-05-01 | Disposition: A | Discharge status: Home / Self Care | Payer: PRIVATE HEALTH INSURANCE | Attending: Gastroenterology | Admitting: Gastroenterology

## 2018-05-01 ENCOUNTER — Other Ambulatory Visit: Payer: Self-pay

## 2018-05-01 ENCOUNTER — Ambulatory Visit (HOSPITAL_COMMUNITY): Payer: PRIVATE HEALTH INSURANCE | Admitting: Certified Registered Nurse Anesthetist

## 2018-05-01 ENCOUNTER — Encounter (HOSPITAL_COMMUNITY): Payer: Self-pay | Admitting: Certified Registered Nurse Anesthetist

## 2018-05-01 ENCOUNTER — Encounter (HOSPITAL_COMMUNITY)
Admission: RE | Disposition: A | Discharge status: Home / Self Care | Payer: Self-pay | Source: Home / Self Care | Attending: Gastroenterology

## 2018-05-01 DIAGNOSIS — F329 Major depressive disorder, single episode, unspecified: Secondary | ICD-10-CM | POA: Insufficient documentation

## 2018-05-01 DIAGNOSIS — K625 Hemorrhage of anus and rectum: Secondary | ICD-10-CM

## 2018-05-01 DIAGNOSIS — K649 Unspecified hemorrhoids: Secondary | ICD-10-CM

## 2018-05-01 DIAGNOSIS — Z7982 Long term (current) use of aspirin: Secondary | ICD-10-CM | POA: Diagnosis not present

## 2018-05-01 DIAGNOSIS — Z791 Long term (current) use of non-steroidal anti-inflammatories (NSAID): Secondary | ICD-10-CM | POA: Diagnosis not present

## 2018-05-01 DIAGNOSIS — K2950 Unspecified chronic gastritis without bleeding: Secondary | ICD-10-CM | POA: Diagnosis not present

## 2018-05-01 DIAGNOSIS — Z79899 Other long term (current) drug therapy: Secondary | ICD-10-CM | POA: Diagnosis not present

## 2018-05-01 DIAGNOSIS — K228 Other specified diseases of esophagus: Secondary | ICD-10-CM

## 2018-05-01 DIAGNOSIS — I899 Noninfective disorder of lymphatic vessels and lymph nodes, unspecified: Secondary | ICD-10-CM

## 2018-05-01 DIAGNOSIS — F419 Anxiety disorder, unspecified: Secondary | ICD-10-CM | POA: Diagnosis not present

## 2018-05-01 DIAGNOSIS — K319 Disease of stomach and duodenum, unspecified: Secondary | ICD-10-CM | POA: Insufficient documentation

## 2018-05-01 DIAGNOSIS — K219 Gastro-esophageal reflux disease without esophagitis: Secondary | ICD-10-CM

## 2018-05-01 DIAGNOSIS — K7689 Other specified diseases of liver: Secondary | ICD-10-CM

## 2018-05-01 DIAGNOSIS — K317 Polyp of stomach and duodenum: Secondary | ICD-10-CM | POA: Diagnosis not present

## 2018-05-01 DIAGNOSIS — G4733 Obstructive sleep apnea (adult) (pediatric): Secondary | ICD-10-CM | POA: Diagnosis not present

## 2018-05-01 DIAGNOSIS — R1011 Right upper quadrant pain: Secondary | ICD-10-CM | POA: Diagnosis present

## 2018-05-01 DIAGNOSIS — Z860100 Personal history of colon polyps, unspecified: Secondary | ICD-10-CM

## 2018-05-01 DIAGNOSIS — Z87891 Personal history of nicotine dependence: Secondary | ICD-10-CM | POA: Diagnosis not present

## 2018-05-01 DIAGNOSIS — D132 Benign neoplasm of duodenum: Secondary | ICD-10-CM | POA: Insufficient documentation

## 2018-05-01 DIAGNOSIS — K3189 Other diseases of stomach and duodenum: Secondary | ICD-10-CM

## 2018-05-01 HISTORY — PX: EUS: SHX5427

## 2018-05-01 HISTORY — DX: Polycythemia vera: D45

## 2018-05-01 HISTORY — DX: Personal history of urinary calculi: Z87.442

## 2018-05-01 HISTORY — DX: Spinal stenosis, site unspecified: M48.00

## 2018-05-01 HISTORY — PX: ESOPHAGOGASTRODUODENOSCOPY: SHX5428

## 2018-05-01 HISTORY — PX: BIOPSY: SHX5522

## 2018-05-01 LAB — LIPASE, BLOOD: Lipase: 35 U/L (ref 11–51)

## 2018-05-01 LAB — AMYLASE: Amylase: 34 U/L (ref 28–100)

## 2018-05-01 LAB — HEPATIC FUNCTION PANEL
ALT: 20 U/L (ref 0–44)
AST: 19 U/L (ref 15–41)
Albumin: 3.6 g/dL (ref 3.5–5.0)
Alkaline Phosphatase: 72 U/L (ref 38–126)
Bilirubin, Direct: 0.1 mg/dL (ref 0.0–0.2)
Indirect Bilirubin: 1 mg/dL — ABNORMAL HIGH (ref 0.3–0.9)
Total Bilirubin: 1.1 mg/dL (ref 0.3–1.2)
Total Protein: 7.1 g/dL (ref 6.5–8.1)

## 2018-05-01 SURGERY — UPPER ENDOSCOPIC ULTRASOUND (EUS) RADIAL
Anesthesia: General

## 2018-05-01 MED ORDER — SODIUM CHLORIDE 0.9 % IV SOLN: INTRAVENOUS | Status: DC

## 2018-05-01 MED ORDER — OMEPRAZOLE 20 MG PO CPDR: 20.0000 mg | DELAYED_RELEASE_CAPSULE | Freq: Two times a day (BID) | ORAL | 2 refills | Status: DC

## 2018-05-01 MED ORDER — ONDANSETRON HCL 4 MG/2ML IJ SOLN
4.0000 mg | Freq: Once | INTRAMUSCULAR | Status: AC
  Administered 2018-05-01: 4 mg via INTRAVENOUS

## 2018-05-01 MED ORDER — ONDANSETRON HCL 4 MG/2ML IJ SOLN: 4.0000 mg | Freq: Once | INTRAMUSCULAR | Status: DC

## 2018-05-01 MED ORDER — PROPOFOL 500 MG/50ML IV EMUL
INTRAVENOUS | Status: DC | PRN
  Administered 2018-05-01: 11:00:00 via INTRAVENOUS
  Administered 2018-05-01: 125 ug/kg/min via INTRAVENOUS

## 2018-05-01 MED ORDER — PROPOFOL 10 MG/ML IV BOLUS
INTRAVENOUS | Status: DC | PRN
  Administered 2018-05-01 (×2): 10 mg via INTRAVENOUS
  Administered 2018-05-01: 20 mg via INTRAVENOUS
  Administered 2018-05-01: 10 mg via INTRAVENOUS
  Administered 2018-05-01 (×3): 20 mg via INTRAVENOUS
  Administered 2018-05-01: 40 mg via INTRAVENOUS
  Administered 2018-05-01: 20 mg via INTRAVENOUS
  Administered 2018-05-01: 40 mg via INTRAVENOUS
  Administered 2018-05-01: 20 mg via INTRAVENOUS
  Administered 2018-05-01: 10 mg via INTRAVENOUS

## 2018-05-01 MED ORDER — LACTATED RINGERS IV SOLN
INTRAVENOUS | Status: DC
  Administered 2018-05-01 (×2): via INTRAVENOUS

## 2018-05-01 MED ORDER — ONDANSETRON HCL 4 MG/2ML IJ SOLN
INTRAMUSCULAR | Status: AC
  Filled 2018-05-01: qty 2

## 2018-05-01 MED ORDER — LIDOCAINE HCL (CARDIAC) PF 100 MG/5ML IV SOSY
PREFILLED_SYRINGE | INTRAVENOUS | Status: DC | PRN
  Administered 2018-05-01: 40 mg via INTRAVENOUS

## 2018-05-01 NOTE — Anesthesia Postprocedure Evaluation (Signed)
Anesthesia Post Note  Patient: Caitlin Moran  Procedure(s) Performed: UPPER ENDOSCOPIC ULTRASOUND (EUS) RADIAL (N/A ) ESOPHAGOGASTRODUODENOSCOPY (EGD) (N/A ) BIOPSY POLYPECTOMY     Patient location during evaluation: Endoscopy Anesthesia Type: General Level of consciousness: awake Pain management: pain level controlled Vital Signs Assessment: post-procedure vital signs reviewed and stable Respiratory status: spontaneous breathing Cardiovascular status: stable Postop Assessment: no apparent nausea or vomiting Anesthetic complications: no    Last Vitals:  Vitals:   05/01/18 1230 05/01/18 1240  BP: (!) 172/95 (!) 150/79  Pulse: 96 96  Resp: 20 (!) 23  Temp:    SpO2: 97% 96%    Last Pain:  Vitals:   05/01/18 1240  TempSrc:   PainSc: 9                  Jannae Fagerstrom

## 2018-05-01 NOTE — Op Note (Signed)
[name] Patient Name: Caitlin Moran Procedure Date : 05/01/2018 MRN: [id number] Attending MD: Justice Britain , MD Date of Birth: [birth year] CSN: [id number] Age: 58 Admit Type: Outpatient Procedure:                Upper EUS Indications:              Suspected choledocholithiasis, Abdominal pain in                            the right upper quadrant Providers:                Justice Britain, MD, Burtis Junes, RN, Charolette Child, Technician, Hedy Camara Referring MD:              Medicines:                Monitored Anesthesia Care Complications:            No immediate complications. Estimated Blood Loss:     Estimated blood loss was minimal. Procedure:                Pre-Anesthesia Assessment:                           - Prior to the procedure, a History and Physical                            was performed, and patient medications and                            allergies were reviewed. The patient's tolerance of                            previous anesthesia was also reviewed. The risks                            and benefits of the procedure and the sedation                            options and risks were discussed with the patient.                            All questions were answered, and informed consent                            was obtained. Prior Anticoagulants: The patient has                            taken no previous anticoagulant or antiplatelet                            agents. ASA Grade Assessment: II - A patient with  mild systemic disease. After reviewing the risks                            and benefits, the patient was deemed in                            satisfactory condition to undergo the procedure.                           After obtaining informed consent, the endoscope was                            passed under direct vision. Throughout the                            procedure,  the patient's blood pressure, pulse, and                            oxygen saturations were monitored continuously. The                            [id number]-H190 ([id number]) Olympus gastroscope was                            introduced through the mouth, and advanced to the                            second part of duodenum. The [id number]-[id number] ([id number])                            Olympus duodenoscope was introduced through the                            mouth, and advanced to the second part of duodenum.                            The [id number] ([id number]) Olympus Linear EUS scope                            was introduced through the mouth, and advanced to                            the duodenum for ultrasound examination from the                            stomach and duodenum. The upper EUS was                            accomplished without difficulty. The patient                            tolerated the procedure. Scope In: Scope Out: Findings:      ENDOSCOPIC FINDING: :      No gross lesions were noted  in the proximal esophagus and in the mid       esophagus.      The esophagus and gastroesophageal junction were examined with white       light and narrow band imaging (NBI) from a forward view and retroflexed       position. There were esophageal mucosal changes suggestive of       short-segment Barrett's esophagus. These changes involved the mucosa at       the upper extent of the gastric folds (40 cm from the incisors)       extending to the Z-line (40 cm from the incisors). One tongue of       salmon-colored mucosa was present from 38 to 40 cm and squamous islands       were present at 39 cm. The maximum longitudinal extent of these       esophageal mucosal changes was 2 cm in length. Biopsies were taken with       a cold forceps for histology.      Multiple 1 to 4 mm sessile polyps with no bleeding and no stigmata of       recent bleeding were found in the gastric fundus and in the gastric        body. The polyps had typical appearance of fundic gland polyps. Biopsies       were taken with a cold forceps for histology from a few of these lesions.      A larger single 10 mm semi-sessile polyp with no bleeding and no       stigmata of recent bleeding was found on the lesser curvature of the       gastric body. Preparations were made for mucosal resection. Saline was       injected to raise the lesion. Snare mucosal resection was performed.       Resection and retrieval were complete. To close the defect after mucosal       resection, one hemostatic clip was successfully placed (MR conditional).       There was no bleeding during, or at the end, of the procedure.      White nummular lesions were noted in the gastric body (posterior wall)       which did not wash off and when scraped showed a slightly more       erythematous region underneath. Biopsies were taken with a cold forceps       for histology.      No other gross lesions were noted in the entire examined stomach.       Biopsies were taken with a cold forceps for histology and Helicobacter       pylori testing.      Normal mucosa was found in the duodenal bulb, in the first portion of       the duodenum and in the second portion of the duodenum.      The ampulla was normal.      A single 13 mm sessile polypoid lesion with no bleeding was found in the       area between the papilla and the area of the minor papilla. Biopsies       were taken with a cold forceps for histology to rule in/out adenoma.      ENDOSONOGRAPHIC FINDING: :      Endosonographic imaging in the common bile duct (CBD 3.3 -> 4.7 mm) and       in the common  hepatic duct (8.0 mm) showed no stones or sludge.      Endosonographic imaging of the ampulla showed no intramural       (subepithelial) lesion or mass.      There was no sign of significant endosonographic abnormality in the       pancreatic head (PD = 1.3 -> 1.7 mm), genu of the pancreas (PD = 1.8        mm), pancreatic body (PD = 0.7 mm) and pancreatic tail (PD = 0.8 mm). No       masses, no cysts, no calcifications, the pancreatic duct was regular in       contour.      A cyst was found in the visualized portion of the liver and measured       13.2 mm by 8.3 mm in maximal cross-sectional diameter. The cyst was       anechoic. It was without septae. Previous MRICP had shown a similar cyst.      Otherwise, Endosonographic imaging in the visualized portion of the left       lobe of the liver showed no mass.      No malignant-appearing lymph nodes were visualized in the celiac region       (level 20), peripancreatic region and porta hepatis region.      The celiac region was visualized. Impression:               EGD Impression:                           - No gross lesions in proximal/middle esophagus.                           - Esophageal mucosal changes suggestive of                            short-segment Barrett's esophagus. Biopsied to rule                            in/out.                           - Multiple gastric polyps. Biopsied - though                            appearance typical of fundic gland polyps.                           - A single gastric polyp. Resected and retrieved                            via mucosal resection. Clip (MR conditional) was                            placed to close defect.                           - White nummular lesions in posterior wall of the  body of the gastric mucosa. Biopsied.                           - No other gross lesions in the stomach. Biopsied                            for HP.                           - Normal mucosa was found in the duodenal bulb, in                            the first portion of the duodenum and in the second                            portion of the duodenum.                           - Normal ampulla.                           - A single duodenal polypoid lesion between the                             major and minor papilla was found. Biopsied to rule                            in/out adenoma.                           EUS Impression:                           - There was no sign of significant pathology in the                            pancreatic head, genu of the pancreas, pancreatic                            body and pancreatic tail.                           - There was no sign of significant pathology in the                            bile duct. No mass/stone/sludge noted.                           - There was no sign of significant pathology at the                            ampulla.                           - A cyst was found in the visualized portion of  the                            liver.                           - No malignant-appearing lymph nodes were                            visualized in the celiac region (level 20),                            peripancreatic region and porta hepatis region. Recommendation:           - The patient will be observed post-procedure,                            until all discharge criteria are met.                           - Discharge patient to home.                           - Patient has a contact number available for                            emergencies. The signs and symptoms of potential                            delayed complications were discussed with the                            patient. Return to normal activities tomorrow.                            Written discharge instructions were provided to the                            patient.                           - Follow up HFP/Amylase/Lipase drawn today.                           - Await path results.                           - If evidence of adenoma in the polypoid lesion in                            the duodenum, will need to bring patient to clinic                            to discuss EMR attempt.                           - If Barrett's  esophagus will need surveillance.                           - Continue Omeprazole, but increase to 75m BID                            (she has OTC can be prescribed if she would like [place]                           to) in interim.                           - The findings and recommendations were discussed                            with the patient.                           - The findings and recommendations were discussed                            with the designated responsible adult. Procedure Code(s):        --- Professional ---                           [phone] Esophagogastroduodenoscopy, flexible,                            transoral; with endoscopic mucosal resection                           43237, Esophagogastroduodenoscopy, flexible,                            transoral; with endoscopic ultrasound examination                            limited to the esophagus, stomach or duodenum, and                            adjacent structures                           43239, 59, Esophagogastroduodenoscopy, flexible,                            transoral; with biopsy, single or multiple Diagnosis Code(s):        --- Professional ---                           K22.8, Other specified diseases of esophagus                           K31.7, Polyp of stomach and duodenum                           K31.89, Other diseases  of stomach and duodenum                           I89.9, Noninfective disorder of lymphatic vessels                            and lymph nodes, unspecified                           K76.89, Other specified diseases of liver                           R10.11, Right upper quadrant pain CPT copyright 2018 American Medical Association. All rights reserved. The codes documented in this report are preliminary and upon coder review may  be revised to meet current compliance requirements. Justice Britain, MD 05/01/2018 12:20:47 PM Number of Addenda: 0

## 2018-05-01 NOTE — Transfer of Care (Signed)
Immediate Anesthesia Transfer of Care Note  Patient: Caitlin Moran  Procedure(s) Performed: UPPER ENDOSCOPIC ULTRASOUND (EUS) RADIAL (N/A ) ESOPHAGOGASTRODUODENOSCOPY (EGD) (N/A ) BIOPSY  Patient Location: PACU and Endoscopy Unit  Anesthesia Type:MAC  Level of Consciousness: awake, alert , oriented and patient cooperative  Airway & Oxygen Therapy: Patient Spontanous Breathing and Patient connected to nasal cannula oxygen  Post-op Assessment: Report given to RN and Post -op Vital signs reviewed and stable  Post vital signs: Reviewed and stable  Last Vitals:  Vitals Value Taken Time  BP 145/89 05/01/2018 11:58 AM  Temp    Pulse 108 05/01/2018 11:59 AM  Resp 19 05/01/2018 11:59 AM  SpO2 96 % 05/01/2018 11:59 AM  Vitals shown include unvalidated device data.  Last Pain:  Vitals:   05/01/18 1158  TempSrc: Oral  PainSc: 0-No pain         Complications: No apparent anesthesia complications

## 2018-05-01 NOTE — H&P (Signed)
GASTROENTEROLOGY OUTPATIENT PROCEDURE H&P NOTE   Primary Care Physician: Deland Pretty, MD  HPI: Caitlin Moran is a 58 y.o. female who presents for EGD/EUS  Past Medical History:  Diagnosis Date  . Anxiety   . Arthritis    neck  . Depression   . GERD (gastroesophageal reflux disease)   . Headache   . History of kidney stones    kidney stone -lithrotrispy 04/30/18- 2 current - stable  . IBS (irritable bowel syndrome)   . OSA (obstructive sleep apnea)    need to get CPAP  . Polycythemia vera ([place])   . PONV (postoperative nausea and vomiting)    Didn't go to sleep all the way  during colonoscopy.  . Renal calculus, right   . Right ureteral stone   . Spinal stenosis    Past Surgical History:  Procedure Laterality Date  . ABLATION     unquestionable  . CESAREAN SECTION    . CHOLECYSTECTOMY    . COLONOSCOPY    . ESOPHAGOGASTRODUODENOSCOPY    . EXTRACORPOREAL SHOCK WAVE LITHOTRIPSY  02/14/2010  . HYSTEROSCOPY W/D&C N/A 11/27/2012   Procedure: DILATATION AND CURETTAGE /HYSTEROSCOPY;  Surgeon: Marylynn Pearson, MD;  Location: [facility];  Service: Gynecology;  Laterality: N/A;  . WISDOM TOOTH EXTRACTION     Current Facility-Administered Medications  Medication Dose Route Frequency Provider Last Rate Last Dose  . 0.9 %  sodium chloride infusion   Intravenous Continuous Mansouraty, Telford Nab., MD      . lactated ringers infusion   Intravenous Continuous Mansouraty, Telford Nab., MD 10 mL/hr at 05/01/18 [phone]     No Known Allergies Family History  Problem Relation Age of Onset  . Breast cancer Maternal Aunt   . Esophageal cancer Mother   . Lung cancer Father   . CAD Father   . Colon cancer Neg Hx   . Inflammatory bowel disease Neg Hx   . Liver disease Neg Hx   . Pancreatic cancer Neg Hx   . Stomach cancer Neg Hx   . Rectal cancer Neg Hx    Social History   Socioeconomic History  . Marital status: Married    Spouse name: Not on file  . Number of children: 2  .  Years of education: Not on file  . Highest education level: Not on file  Occupational History  . Occupation: Therapist, sports  Social Needs  . Financial resource strain: Not on file  . Food insecurity:    Worry: Not on file    Inability: Not on file  . Transportation needs:    Medical: Not on file    Non-medical: Not on file  Tobacco Use  . Smoking status: Former Smoker    Last attempt to quit: 11/05/1990    Years since quitting: 27.5  . Smokeless tobacco: Never Used  Substance and Sexual Activity  . Alcohol use: Yes    Alcohol/week: 7.0 standard drinks    Types: 7 Glasses of wine per week    Comment: 5  . Drug use: No  . Sexual activity: Not on file  Lifestyle  . Physical activity:    Days per week: Not on file    Minutes per session: Not on file  . Stress: Not on file  Relationships  . Social connections:    Talks on phone: Not on file    Gets together: Not on file    Attends religious service: Not on file    Active member of club or organization:  Not on file    Attends meetings of clubs or organizations: Not on file    Relationship status: Not on file  . Intimate partner violence:    Fear of current or ex partner: Not on file    Emotionally abused: Not on file    Physically abused: Not on file    Forced sexual activity: Not on file  Other Topics Concern  . Not on file  Social History Narrative  . Not on file    Physical Exam: Vital signs in last 24 hours: Temp:  [98.4 F (36.9 C)] 98.4 F (36.9 C) (02/19 0900) Pulse Rate:  [113] 113 (02/19 0900) Resp:  [23] 23 (02/19 0900) BP: (157)/(79) 157/79 (02/19 0900) SpO2:  [96 %] 96 % (02/19 0900) Weight:  [101.6 kg] 101.6 kg (02/19 0900)   GEN: Nauseated EYE: Sclerae anicteric ENT: MMM CV: RR without R/Gs  RESP: CTAB posteriorly GI: Soft, NT/ND NEURO:  Alert & Oriented x 3  Lab Results: No results for input(s): WBC, HGB, HCT, PLT in the last 72 hours. BMET No results for input(s): NA, K, CL, CO2, GLUCOSE, BUN,  CREATININE, CALCIUM in the last 72 hours. LFT No results for input(s): PROT, ALBUMIN, AST, ALT, ALKPHOS, BILITOT, BILIDIR, IBILI in the last 72 hours. PT/INR No results for input(s): LABPROT, INR in the last 72 hours.   Impression / Plan: This is a 58 y.o.female who presents for EGD/EUS.  The risks and benefits of endoscopic evaluation were discussed with the patient; these include but are not limited to the risk of perforation, infection, bleeding, missed lesions, lack of diagnosis, severe illness requiring hospitalization, as well as anesthesia and sedation related illnesses.  The patient is agreeable to proceed.    Justice Britain, MD [facility] Office # [phone]

## 2018-05-01 NOTE — Discharge Instructions (Signed)
YOU HAD AN ENDOSCOPIC PROCEDURE TODAY: Refer to the procedure report and other information in the discharge instructions given to you for any specific questions about what was found during the examination. If this information does not answer your questions, please call [place] office at [phone] to clarify.   YOU SHOULD EXPECT: Some feelings of bloating in the abdomen. Passage of more gas than usual. Walking can help get rid of the air that was put into your GI tract during the procedure and reduce the bloating. If you had a lower endoscopy (such as a colonoscopy or flexible sigmoidoscopy) you may notice spotting of blood in your stool or on the toilet paper. Some abdominal soreness may be present for a day or two, also.  DIET: Your first meal following the procedure should be a light meal and then it is ok to progress to your normal diet. A half-sandwich or bowl of soup is an example of a good first meal. Heavy or fried foods are harder to digest and may make you feel nauseous or bloated. Drink plenty of fluids but you should avoid alcoholic beverages for 24 hours. If you had a esophageal dilation, please see attached instructions for diet.    ACTIVITY: Your care partner should take you home directly after the procedure. You should plan to take it easy, moving slowly for the rest of the day. You can resume normal activity the day after the procedure however YOU SHOULD NOT DRIVE, use power tools, machinery or perform tasks that involve climbing or major physical exertion for 24 hours (because of the sedation medicines used during the test).   SYMPTOMS TO REPORT IMMEDIATELY: A gastroenterologist can be reached at any hour. Please call [phone]  for any of the following symptoms:   Following upper endoscopy (EGD, EUS, ERCP, esophageal dilation) Vomiting of blood or coffee ground material  New, significant abdominal pain  New, significant chest pain or pain under the shoulder blades  Painful or  persistently difficult swallowing  New shortness of breath  Black, tarry-looking or red, bloody stools  FOLLOW UP:  If any biopsies were taken you will be contacted by phone or by letter within the next 1-3 weeks. Call [phone]  if you have not heard about the biopsies in 3 weeks.  Please also call with any specific questions about appointments or follow up tests.  

## 2018-05-01 NOTE — Anesthesia Preprocedure Evaluation (Addendum)
Anesthesia Evaluation  Patient identified by MRN, date of birth, ID band Patient awake    Reviewed: Allergy & Precautions, NPO status , Patient's Chart, lab work & pertinent test results  History of Anesthesia Complications (+) PONV  Airway Mallampati: II       Dental   Pulmonary sleep apnea , former smoker,    breath sounds clear to auscultation       Cardiovascular negative cardio ROS   Rhythm:Regular Rate:Normal     Neuro/Psych    GI/Hepatic Neg liver ROS, GERD  ,  Endo/Other    Renal/GU Renal disease     Musculoskeletal  (+) Arthritis ,   Abdominal   Peds  Hematology   Anesthesia Other Findings   Reproductive/Obstetrics                             Anesthesia Physical Anesthesia Plan  ASA: III  Anesthesia Plan: MAC   Post-op Pain Management:    Induction: Intravenous  PONV Risk Score and Plan: Midazolam, Ondansetron and Dexamethasone  Airway Management Planned: Simple Face Mask and Nasal Cannula  Additional Equipment:   Intra-op Plan:   Post-operative Plan:   Informed Consent: I have reviewed the patients History and Physical, chart, labs and discussed the procedure including the risks, benefits and alternatives for the proposed anesthesia with the patient or authorized representative who has indicated his/her understanding and acceptance.     Dental advisory given  Plan Discussed with: CRNA and Anesthesiologist  Anesthesia Plan Comments:        Anesthesia Quick Evaluation

## 2018-05-02 ENCOUNTER — Encounter: Payer: Self-pay | Admitting: Gastroenterology

## 2018-05-03 ENCOUNTER — Encounter (HOSPITAL_COMMUNITY): Payer: Self-pay | Admitting: Gastroenterology

## 2018-05-06 ENCOUNTER — Telehealth: Payer: Self-pay | Admitting: Gastroenterology

## 2018-05-06 MED ORDER — OMEPRAZOLE 20 MG PO CPDR: 20.0000 mg | DELAYED_RELEASE_CAPSULE | Freq: Two times a day (BID) | ORAL | 2 refills | Status: DC

## 2018-05-06 NOTE — Telephone Encounter (Signed)
Informed pt that Omeprazole 20mg  is over the counter. She asked that a 90day supply be sent to her mail order pharmacy to see if it is cheaper. However if it is cheaper to buy over the counter, she will just purchase it over the counter. [facility]

## 2018-05-06 NOTE — Telephone Encounter (Signed)
Pt needs perscription for Omeprazole sent to [facility] for 90-day supply, pt takes 1 pill BID.

## 2018-05-09 ENCOUNTER — Ambulatory Visit: Payer: PRIVATE HEALTH INSURANCE | Admitting: Neurology

## 2018-05-09 ENCOUNTER — Encounter: Payer: Self-pay | Admitting: Neurology

## 2018-05-09 VITALS — BP 130/88 | HR 68 | Ht 67.0 in | Wt 239.0 lb

## 2018-05-09 DIAGNOSIS — D582 Other hemoglobinopathies: Secondary | ICD-10-CM | POA: Insufficient documentation

## 2018-05-09 DIAGNOSIS — K219 Gastro-esophageal reflux disease without esophagitis: Secondary | ICD-10-CM

## 2018-05-09 DIAGNOSIS — R0683 Snoring: Secondary | ICD-10-CM

## 2018-05-09 DIAGNOSIS — R718 Other abnormality of red blood cells: Secondary | ICD-10-CM | POA: Diagnosis not present

## 2018-05-09 DIAGNOSIS — G4719 Other hypersomnia: Secondary | ICD-10-CM

## 2018-05-09 DIAGNOSIS — G4730 Sleep apnea, unspecified: Secondary | ICD-10-CM | POA: Insufficient documentation

## 2018-05-09 NOTE — Patient Instructions (Signed)
Hypersomnia Hypersomnia is a condition in which a person feels very tired during the day even though he or she gets plenty of sleep at night. A person with this condition may take naps during the day and may find it very difficult to wake up from sleep. Hypersomnia may affect a person's ability to think, concentrate, drive, or remember things. What are the causes? The cause of this condition may not be known. Possible causes include:  Certain medicines.  Sleep disorders, such as narcolepsy and sleep apnea.  Injury to the head, brain, or spinal cord.  Drug or alcohol use.  Gastroesophageal reflux disease (GERD).  Tumors.  Certain medical conditions, such as depression, diabetes, or an underactive thyroid gland (hypothyroidism). What are the signs or symptoms? The main symptoms of hypersomnia include:  Feeling very tired throughout the day, regardless of how much sleep you got the night before.  Having trouble waking up. Others may find it difficult to wake you up when you are sleeping.  Sleeping for longer and longer periods at a time.  Taking naps throughout the day. Other symptoms may include:  Feeling restless, anxious, or annoyed.  Lacking energy.  Having trouble with: ? Remembering. ? Speaking. ? Thinking.  Loss of appetite.  Seeing, hearing, tasting, smelling, or feeling things that are not real (hallucinations). How is this diagnosed? This condition may be diagnosed based on:  Your symptoms and medical history.  Your sleeping habits. Your health care provider may ask you to write down your sleeping habits in a daily sleep log, along with any symptoms you have.  A series of tests that are done while you sleep (sleep study or polysomnogram).  A test that measures how quickly you can fall asleep during the day (daytime nap study or multiple sleep latency test). How is this treated? Treatment can help you manage your condition. Treatment may  include:  Following a regular sleep routine.  Lifestyle changes, such as changing your eating habits, getting regular exercise, and avoiding alcohol or caffeinated beverages.  Taking medicines to make you more alert (stimulants) during the day.  Treating any underlying medical causes of hypersomnia. Follow these instructions at home: Sleep routine   Schedule the same bedtime and wake-up time each day.  Practice a relaxing bedtime routine. This may include reading, meditation, deep breathing, or taking a warm bath before going to sleep.  Get regular exercise each day. Avoid strenuous exercise in the evening hours.  Keep your sleep environment at a cooler temperature, darkened, and quiet.  Sleep with pillows and a mattress that are comfortable and supportive.  Schedule short 20-minute naps for when you feel sleepiest during the day.  Talk with your employer or teachers about your hypersomnia. If possible, adjust your schedule so that: ? You have a regular daytime work schedule. ? You can take a scheduled nap during the day. ? You do not have to work or be active at night.  Do not eat a heavy meal for a few hours before bedtime. Eat your meals at about the same times every day.  Avoid drinking alcohol or caffeinated beverages. Safety   Do not drive or use heavy machinery if you are sleepy. Ask your health care provider if it is safe for you to drive.  Wear a life jacket when swimming or spending time near water. General instructions  Take supplements and over-the-counter and prescription medicines only as told by your health care provider.  Keep a sleep log that will help   your doctor manage your condition. This may include information about: ? What time you go to bed each night. ? How often you wake up at night. ? How many hours you sleep at night. ? How often and for how long you nap during the day. ? Any observations from others, such as leg movements during sleep,  sleep walking, or snoring.  Keep all follow-up visits as told by your health care provider. This is important. Contact a health care provider if:  You have new symptoms.  Your symptoms get worse. Get help right away if:  You have serious thoughts about hurting yourself or someone else. If you ever feel like you may hurt yourself or others, or have thoughts about taking your own life, get help right away. You can go to your nearest emergency department or call:  Your local emergency services (911 in the [place]  A suicide crisis helpline, such as the [facility] at 220-437-7722. This is open 24 hours a day. Summary  Hypersomnia refers to a condition in which you feel very tired during the day even though you get plenty of sleep at night.  A person with this condition may take naps during the day and may find it very difficult to wake up from sleep.  Hypersomnia may affect a person's ability to think, concentrate, drive, or remember things.  Treatment, such as following a regular sleep routine and making some lifestyle changes, can help you manage your condition. This information is not intended to replace advice given to you by your health care provider. Make sure you discuss any questions you have with your health care provider. Document Released: 02/17/2002 Document Revised: 03/01/2017 Document Reviewed: 03/01/2017 Elsevier Interactive Patient Education  2019 Else[employer] Inc. Sleep Apnea Sleep apnea affects breathing during sleep. It causes breathing to stop for a short time or to become shallow. It can also increase the risk of:  Heart attack.  Stroke.  Being very overweight (obese).  Diabetes.  Heart failure.  Irregular heartbeat. The goal of treatment is to help you breathe normally again. What are the causes? There are three kinds of sleep apnea:  Obstructive sleep apnea. This is caused by a blocked or collapsed airway.  Central sleep  apnea. This happens when the brain does not send the right signals to the muscles that control breathing.  Mixed sleep apnea. This is a combination of obstructive and central sleep apnea. The most common cause of this condition is a collapsed or blocked airway. This can happen if:  Your throat muscles are too relaxed.  Your tongue and tonsils are too large.  You are overweight.  Your airway is too small. What increases the risk?  Being overweight.  Smoking.  Having a small airway.  Being older.  Being female.  Drinking alcohol.  Taking medicines to calm yourself (sedatives or tranquilizers).  Having family members with the condition. What are the signs or symptoms?  Trouble staying asleep.  Being sleepy or tired during the day.  Getting angry a lot.  Loud snoring.  Headaches in the morning.  Not being able to focus your mind (concentrate).  Forgetting things.  Less interest in sex.  Mood swings.  Personality changes.  Feelings of sadness (depression).  Waking up a lot during the night to pee (urinate).  Dry mouth.  Sore throat. How is this diagnosed?  Your medical history.  A physical exam.  A test that is done when you are sleeping (sleep study). The  test is most often done in a sleep lab but may also be done at home. How is this treated?   Sleeping on your side.  Using a medicine to get rid of mucus in your nose (decongestant).  Avoiding the use of alcohol, medicines to help you relax, or certain pain medicines (narcotics).  Losing weight, if needed.  Changing your diet.  Not smoking.  Using a machine to open your airway while you sleep, such as: ? An oral appliance. This is a mouthpiece that shifts your lower jaw forward. ? A CPAP device. This device blows air through a mask when you breathe out (exhale). ? An EPAP device. This has valves that you put in each nostril. ? A BPAP device. This device blows air through a mask when you  breathe in (inhale) and breathe out.  Having surgery if other treatments do not work. It is important to get treatment for sleep apnea. Without treatment, it can lead to:  High blood pressure.  Coronary artery disease.  In men, not being able to have an erection (impotence).  Reduced thinking ability. Follow these instructions at home: Lifestyle  Make changes that your doctor recommends.  Eat a healthy diet.  Lose weight if needed.  Avoid alcohol, medicines to help you relax, and some pain medicines.  Do not use any products that contain nicotine or tobacco, such as cigarettes, e-cigarettes, and chewing tobacco. If you need help quitting, ask your doctor. General instructions  Take over-the-counter and prescription medicines only as told by your doctor.  If you were given a machine to use while you sleep, use it only as told by your doctor.  If you are having surgery, make sure to tell your doctor you have sleep apnea. You may need to bring your device with you.  Keep all follow-up visits as told by your doctor. This is important. Contact a doctor if:  The machine that you were given to use during sleep bothers you or does not seem to be working.  You do not get better.  You get worse. Get help right away if:  Your chest hurts.  You have trouble breathing in enough air.  You have an uncomfortable feeling in your back, arms, or stomach.  You have trouble talking.  One side of your body feels weak.  A part of your face is hanging down. These symptoms may be an emergency. Do not wait to see if the symptoms will go away. Get medical help right away. Call your local emergency services (911 in the [place] Do not drive yourself to the hospital. Summary  This condition affects breathing during sleep.  The most common cause is a collapsed or blocked airway.  The goal of treatment is to help you breathe normally while you sleep. This information is not intended to  replace advice given to you by your health care provider. Make sure you discuss any questions you have with your health care provider. Document Released: 12/07/2007 Document Revised: 10/23/2017 Document Reviewed: 10/23/2017 Elsevier Interactive Patient Education  [employer].

## 2018-05-09 NOTE — Progress Notes (Addendum)
SLEEP MEDICINE CLINIC   Provider:  Larey Seat, M.D.    Primary Care Physician:  Deland Pretty, MD   Referring Provider: Deland Pretty, MD   Chief Complaint  Patient presents with  . New Patient (Initial Visit)    pt alone, rm 11. pt states that she has a sleep study completed in 2013. she was unable to set up with CPAP set up due to insurance.  she states she has new insurance and is wanting to complete the process over. pt states that once she is up and moving she is fine but she has a hard time with getting up in the morning and that is whether she has 7 hrs or 12 hrs. her husband states she snorees in sleep.     HPI:  Stephanny Tsutsui is a 58 y.o. female patient that had been seen here in 2013, was using CPAP but lost coverage by insurance.  She is seen upon a referral from Dr. Shelia Media on 05-09-2018. PCP is concerned about rising H and H.  Dr. Jake Samples had already made a referral to hematology and the work-up has thus far not indicated as a cause of the elevated hemoglobin and hematocrit.  I was able to see the last lab results that actually started in May 2018 but only the hemoglobin was mildly elevated at 15.6 the hematocrit at the time was 46.3 by November the same year hemoglobin was 15.9 and hematocrit 47.4 as of November 15th 2019 the patient's hemoglobin had risen to 16.3 and her hematocrit of 48.6.  Normal MCV, normal MCH, normal white blood cell count and differential.  No elevation in alkaline phosphatase, normal liver function tests normal fasting glucose.  The patient has a known history of psoriatic arthritis, gastroesophageal reflux disease with possible irritable bowel, depression-anxiety in the past, kidney stones, headache disorder, she had been treated for it by Dr. Letta Moynahan since 2013 on Cymbalta 120 mg but her weight has gone up. She is now using Trintellix.  Chief complaint according to patient :" I am looking for a reason/ cause for my H and H going up".    Sleep habits are as follows: dinner time is 6-7 Pm, and bedtime 11-12 pm. She sleeps in a cool, quiet and dark bedroom.  She prefers to sleep on her side and on one pillow only.  She sleeps on a flat bed. She may sleep 12 hours but feels this has no restorative / refreshing quality any more. She has vivid dreams, nocturia 4-5 , she does not feel that her dreams wake her, she denies palpitations or diaphoresis.  On average she will get 8 to 10 hours of sleep.  She has trouble waking up and she has trouble leaving the bed in the morning creating more sleep initially.  Alarm rings at 730 AM . She has slept with the help of medication, but she can also sleep in daytime, taking naps- independent of how many hours of sleep she got at night. She wakes up with a dry mouth and headaches. She can't take naps in daytime ,not at work.    Sleep medical history; Mrs. Jacobi Ryant. Haye had been evaluated for sleep disordered breathing about  7 years ago and we ordered CPAP,  but she never got the benefit of using it. Anxiety, major depression. Obesity, and see above. Her previous sleep study was performed on 30 September 2011 her AHI was 23.5 there was no REM sleep noted, in supine position AHI  exacerbated to 67.9.  She also had very frequent periodic limb movement arousal 7.8/h, loud snoring was noted and her EEG was unusual as it showed a lot of pseudo-spindles which are likely a medication side effect.  She return for CPAP titration study on 24 June 2012 AHI was reduced to 1.3 at a pressure of 9 cmH2O but she was unable to obtain CPAP due to insurance changes.    Family sleep history: Family history the patient's father died at age 39 with coronary artery disease he had lung cancer.  Her mother died at 47 years of age of esophageal cancer.  Her father was snoring but never officially diagnosed or evaluated for sleep apnea. No sleepwalking history.  No history of narcolepsy.   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 She endorsed today the Epworth sleepiness score at 7 points, fatigue severity much higher at 42 points.   Review of Systems: Out of a complete 14 system review, the patient complains of only the following symptoms, and all other reviewed systems are negative.  Always sleepy;  no matter how long I sleep.   Social History   Socioeconomic History  . Marital status: Married    Spouse name: Not on file  . Number of children: 2  . Years of education: Not on file  . Highest education level: Not on file  Occupational History  . Occupation: Therapist, sports  Social Needs  . Financial resource strain: Not on file  . Food insecurity:    Worry: Not on file    Inability: Not on file  . Transportation needs:    Medical: Not on file    Non-medical: Not on file  Tobacco Use  . Smoking status: Former Smoker    Last attempt to quit: 11/05/1990    Years since quitting: 27.5  . Smokeless tobacco: Never Used  Substance and Sexual Activity  . Alcohol use: Yes    Alcohol/week: 7.0 standard drinks    Types: 7 Glasses of wine per week    Comment: 5  . Drug use: No  . Sexual activity: Not on file  Lifestyle  . Physical activity:    Days per week: Not on file    Minutes per session: Not on file  . Stress: Not on file  Relationships  . Social connections:    Talks on phone: Not on file    Gets together: Not on file    Attends religious service: Not on file    Active member of club or organization: Not on file    Attends meetings of clubs or organizations: Not on file    Relationship status: Not on file  . Intimate partner violence:    Fear of current or ex partner: Not on file    Emotionally abused: Not on file    Physically abused: Not on file    Forced sexual activity: Not on file  Other Topics Concern  . Not on file  Social History Narrative  . Not on file    Family History  Problem Relation Age of Onset   . Breast cancer Maternal Aunt   . Esophageal cancer Mother   . Lung cancer Father   . CAD Father   . Colon cancer Neg Hx   . Inflammatory bowel disease Neg Hx   . Liver disease Neg Hx   . Pancreatic cancer Neg Hx   . Stomach cancer  Neg Hx   . Rectal cancer Neg Hx     Past Medical History:  Diagnosis Date  . Anxiety   . Arthritis    neck  . Depression   . GERD (gastroesophageal reflux disease)   . Headache   . History of kidney stones    kidney stone -lithrotrispy 04/30/18- 2 current - stable  . IBS (irritable bowel syndrome)   . OSA (obstructive sleep apnea)    need to get CPAP  . Polycythemia vera ([place])   . PONV (postoperative nausea and vomiting)    Didn't go to sleep all the way  during colonoscopy.  . Renal calculus, right   . Right ureteral stone   . Spinal stenosis     Past Surgical History:  Procedure Laterality Date  . ABLATION     unquestionable  . BIOPSY  05/01/2018   Procedure: BIOPSY;  Surgeon: Rush Landmark Telford Nab., MD;  Location: [facility];  Service: Gastroenterology;;  . CESAREAN SECTION    . CHOLECYSTECTOMY    . COLONOSCOPY    . ESOPHAGOGASTRODUODENOSCOPY    . ESOPHAGOGASTRODUODENOSCOPY N/A 05/01/2018   Procedure: ESOPHAGOGASTRODUODENOSCOPY (EGD);  Surgeon: Irving Copas., MD;  Location: [facility];  Service: Gastroenterology;  Laterality: N/A;  . EUS N/A 05/01/2018   Procedure: UPPER ENDOSCOPIC ULTRASOUND (EUS) RADIAL;  Surgeon: Rush Landmark Telford Nab., MD;  Location: [facility];  Service: Gastroenterology;  Laterality: N/A;  . EXTRACORPOREAL SHOCK WAVE LITHOTRIPSY  02/14/2010  . HYSTEROSCOPY W/D&C N/A 11/27/2012   Procedure: DILATATION AND CURETTAGE /HYSTEROSCOPY;  Surgeon: Marylynn Pearson, MD;  Location: [facility];  Service: Gynecology;  Laterality: N/A;  . WISDOM TOOTH EXTRACTION      Current Outpatient Medications  Medication Sig Dispense Refill  . aspirin EC 81 MG tablet Take 81 mg by mouth daily.    Marland Kitchen CALCIUM PO Take 1,000 mg  by mouth daily.    . celecoxib (CELEBREX) 200 MG capsule Take 200 mg by mouth daily.     . Cholecalciferol (VITAMIN D) 50 MCG (2000 UT) CAPS Take 2,000 Units by mouth daily.     . clobetasol cream (TEMOVATE) 4.08 % Apply 1 application topically daily as needed (for psoriasis).     . clonazePAM (KLONOPIN) 0.5 MG tablet Take 0.5 mg by mouth at bedtime as needed for anxiety.    . fish oil-omega-3 fatty acids 1000 MG capsule Take 1 g by mouth daily.     . Golimumab (SIMPONI) 50 MG/0.5ML SOAJ Inject 50 mg into the skin every 30 (thirty) days.     Marland Kitchen HYDROcodone-acetaminophen (NORCO) 7.5-325 MG tablet Take 1 tablet by mouth 3 (three) times daily as needed for moderate pain.   0  . Multiple Vitamins-Minerals (MULTIVITAMIN WOMEN PO) Take 1 tablet by mouth daily.    Marland Kitchen omeprazole (PRILOSEC) 20 MG capsule Take 1 capsule (20 mg total) by mouth 2 (two) times daily before a meal. 60 capsule 2  . polyethylene glycol-electrolytes (NULYTELY/GOLYTELY) 420 g solution Take 4,000 mLs by mouth as directed. 4000 mL 0  . Probiotic Product (PROBIOTIC PO) Take 1 capsule by mouth daily.    . rosuvastatin (CRESTOR) 5 MG tablet Take 5 mg by mouth daily.    Marland Kitchen vortioxetine HBr (TRINTELLIX) 20 MG TABS tablet Take 20 mg by mouth daily.     No current facility-administered medications for this visit.     Allergies as of 05/09/2018  . (No Known Allergies)    Vitals: BP 130/88   Pulse 68   Ht 5\' 7"  (  1.702 m)   Wt 239 lb (108.4 kg)   LMP 09/24/2012   BMI 37.43 kg/m  Last Weight:  Wt Readings from Last 1 Encounters:  05/09/18 239 lb (108.4 kg)   PFX:TKWI mass index is 37.43 kg/m.     Last Height:   Ht Readings from Last 1 Encounters:  05/09/18 5\' 7"  (1.702 m)   Patient assured me that she takes klonopin rarely, not even weekly and hydrocodone only once daily.   Physical exam:  General: The patient is awake, alert and appears not in acute distress. The patient is well groomed.  Head: Normocephalic, atraumatic.  Neck is supple. Mallampati  2- left tongue is higher-  neck circumference:16.30 ". Nasal airflow congested . Retrognathia is seen.  Cardiovascular:  Regular rate and rhythm , without murmurs or carotid bruit, and without distended neck veins. Respiratory: Lungs are clear to auscultation. Skin:  Without evidence of edema, or rash Trunk: BMI is 37.43 kg/m2 . The patient's posture is erect.   Neurologic exam : The patient is awake and alert, oriented to place and time.   Memory subjective  described as intact.   Attention span & concentration ability appears normal.  Speech is fluent,  without  dysarthria, with mild dysphonia .  Mood and affect are appropriate.  Cranial nerves: Pupils are equal and briskly reactive to light. Extraocular movements  in vertical and horizontal planes intact and without nystagmus. Visual fields by finger perimetry are intact. Hearing to finger rub intact.  Facial sensation intact to fine touch.  Facial motor strength is symmetric and tongue and uvula move midline. Shoulder shrug was symmetrical.   Motor exam:  Normal tone, muscle bulk and symmetric strength in all extremities.  Sensory:  Fine touch, pinprick and vibration were tested in all extremities. Proprioception tested in the upper extremities was normal.  Coordination:  No changes in penmanship .Finger-to-nose maneuver normal without evidence of ataxia, dysmetria or tremor.  Gait and station: Patient walks without assistive device.Strength within normal limits.  Stance is stable and normal based .  Toe and heel stand were tested and normal .Tandem gait is unfragmented. Turns with 3 Steps.   Deep tendon reflexes: in the  upper and lower extremities are symmetric and intact. Babinski maneuver response is  downgoing.    Assessment:  After physical and neurologic examination, review of laboratory studies,  Personal review of imaging studies, reports of other /same  Imaging studies, results of  polysomnography and / or neurophysiology testing and pre-existing records as far as provided in visit., my assessment is   1) LAHOMA CONSTANTIN describes feeling tired not refreshed not restored after an above average long nocturnal sleep time of 8 hours or more.  Her husband has noted that she snores, she was diagnosed before his apnea and probably still have sleep apnea.  She has also other recent risk factors such as a body mass index that has now reached 37, and above average neck circumference, her upper airway is not extremely narrow but the left tongue ground rises higher than the right.   2) latent life long depression. Anxiety treated with prn klonopin.   3) chronic back pain , treated with narcotic prescriptions.     The patient was advised of the nature of the diagnosed disorder , the treatment options and the  risks for general health and wellness arising from not treating the condition.   I spent more than 45  minutes of face to face time with the  patient.  Greater than 50% of time was spent in counseling and coordination of care. We have discussed the diagnosis and differential and I answered the patient's questions.    Plan:  Treatment plan and additional workup :  [facility] BAS-  The patient has hypersomnia with prolonged sleep time.  She also very likely has OSA. I will order both , SPLIT and HST and wait for insurance approval.     Larey Seat, MD 0/04/9845, 30:85 AM  Certified in Neurology by ABPN Certified in [place] by [facility] [address], [place], [place] [address]

## 2018-06-19 DIAGNOSIS — M5412 Radiculopathy, cervical region: Secondary | ICD-10-CM | POA: Insufficient documentation

## 2018-07-02 ENCOUNTER — Ambulatory Visit: Payer: PRIVATE HEALTH INSURANCE | Admitting: Gastroenterology

## 2018-07-11 ENCOUNTER — Ambulatory Visit: Payer: PRIVATE HEALTH INSURANCE | Admitting: Gastroenterology

## 2018-07-16 ENCOUNTER — Ambulatory Visit: Payer: PRIVATE HEALTH INSURANCE | Admitting: Gastroenterology

## 2018-07-18 ENCOUNTER — Ambulatory Visit: Payer: PRIVATE HEALTH INSURANCE | Admitting: Gastroenterology

## 2018-07-25 DIAGNOSIS — M5030 Other cervical disc degeneration, unspecified cervical region: Secondary | ICD-10-CM | POA: Insufficient documentation

## 2018-08-09 ENCOUNTER — Ambulatory Visit (INDEPENDENT_AMBULATORY_CARE_PROVIDER_SITE_OTHER): Payer: PRIVATE HEALTH INSURANCE | Admitting: Podiatry

## 2018-08-09 ENCOUNTER — Other Ambulatory Visit: Payer: Self-pay

## 2018-08-09 ENCOUNTER — Encounter: Payer: Self-pay | Admitting: Podiatry

## 2018-08-09 VITALS — Temp 97.7°F

## 2018-08-09 DIAGNOSIS — G5763 Lesion of plantar nerve, bilateral lower limbs: Secondary | ICD-10-CM | POA: Diagnosis not present

## 2018-08-09 NOTE — Progress Notes (Signed)
Subjective:  Patient ID: Caitlin Moran, female    DOB: Oct 19, 1960,  MRN: [id number]  Chief Complaint  Patient presents with  . Foot Pain    Follow up neuroma 3rd interspace left   "I am still having that pain under my toes"    58 y.o. female presents with the above complaint. Still having the pain under the toes more on the left foot than the right.   Review of Systems: Negative except as noted in the HPI. Denies N/V/F/Ch.  Past Medical History:  Diagnosis Date  . Anxiety   . Arthritis    neck  . Depression   . GERD (gastroesophageal reflux disease)   . Headache   . History of kidney stones    kidney stone -lithrotrispy 04/30/18- 2 current - stable  . IBS (irritable bowel syndrome)   . OSA (obstructive sleep apnea)    need to get CPAP  . Polycythemia vera ([place])   . PONV (postoperative nausea and vomiting)    Didn't go to sleep all the way  during colonoscopy.  . Renal calculus, right   . Right ureteral stone   . Spinal stenosis     Current Outpatient Medications:  .  aspirin EC 81 MG tablet, Take 81 mg by mouth daily., Disp: , Rfl:  .  CALCIUM PO, Take 1,000 mg by mouth daily., Disp: , Rfl:  .  celecoxib (CELEBREX) 200 MG capsule, Take 200 mg by mouth daily. , Disp: , Rfl:  .  Cholecalciferol (VITAMIN D) 50 MCG (2000 UT) CAPS, Take 2,000 Units by mouth daily. , Disp: , Rfl:  .  clobetasol cream (TEMOVATE) 1.60 %, Apply 1 application topically daily as needed (for psoriasis). , Disp: , Rfl:  .  clonazePAM (KLONOPIN) 0.5 MG tablet, Take 0.5 mg by mouth at bedtime as needed for anxiety., Disp: , Rfl:  .  DULoxetine (CYMBALTA) 60 MG capsule, , Disp: , Rfl:  .  fish oil-omega-3 fatty acids 1000 MG capsule, Take 1 g by mouth daily. , Disp: , Rfl:  .  Golimumab (SIMPONI) 50 MG/0.5ML SOAJ, Inject 50 mg into the skin every 30 (thirty) days. , Disp: , Rfl:  .  HYDROcodone-acetaminophen (NORCO) 7.5-325 MG tablet, Take 1 tablet by mouth 3 (three) times daily as needed for  moderate pain. , Disp: , Rfl: 0 .  Multiple Vitamins-Minerals (MULTIVITAMIN WOMEN PO), Take 1 tablet by mouth daily., Disp: , Rfl:  .  omeprazole (PRILOSEC) 20 MG capsule, Take 1 capsule (20 mg total) by mouth 2 (two) times daily before a meal., Disp: 60 capsule, Rfl: 2 .  polyethylene glycol-electrolytes (NULYTELY/GOLYTELY) 420 g solution, Take 4,000 mLs by mouth as directed., Disp: 4000 mL, Rfl: 0 .  Probiotic Product (PROBIOTIC PO), Take 1 capsule by mouth daily., Disp: , Rfl:  .  rosuvastatin (CRESTOR) 5 MG tablet, Take 5 mg by mouth daily., Disp: , Rfl:  .  tiZANidine (ZANAFLEX) 2 MG tablet, TK 1 T PO TID PRN, Disp: , Rfl:  .  vortioxetine HBr (TRINTELLIX) 20 MG TABS tablet, Take 20 mg by mouth daily., Disp: , Rfl:   Social History   Tobacco Use  Smoking Status Former Smoker  . Last attempt to quit: 11/05/1990  . Years since quitting: 27.7  Smokeless Tobacco Never Used    No Known Allergies Objective:   Vitals:   08/09/18 0818  Temp: 97.7 F (36.5 C)   There is no height or weight on file to calculate BMI. Constitutional Well developed.  Well nourished.  Vascular Dorsalis pedis pulses palpable bilaterally. Posterior tibial pulses palpable bilaterally. Capillary refill normal to all digits.  No cyanosis or clubbing noted. Pedal hair growth normal.  Neurologic Normal speech. Oriented to person, place, and time. Epicritic sensation to light touch grossly present bilaterally.  Dermatologic Nails well groomed and normal in appearance. No open wounds. No skin lesions.  Orthopedic: POP 3rd interspace bilat with Mulder's click. L>R   Radiographs: None Assessment:   1. Morton's neuroma of both feet    Plan:  Patient was evaluated and treated and all questions answered.  Interdigital Neuroma, bilaterally -Educated on etiology -Interspace injection delivered as below. -Educated on padding and proper shoegear  Procedure: Neuroma Injection Location: Bilateral 3rd  interspace Skin Prep: Alcohol. Injectate: 0.5 cc 0.5% marcaine plain, 0.5 cc dexamethasone phosphate. Disposition: Patient tolerated procedure well. Injection site dressed with a band-aid.  No follow-ups on file.

## 2018-08-19 ENCOUNTER — Ambulatory Visit (INDEPENDENT_AMBULATORY_CARE_PROVIDER_SITE_OTHER): Payer: PRIVATE HEALTH INSURANCE | Admitting: Neurology

## 2018-08-19 DIAGNOSIS — G4731 Primary central sleep apnea: Secondary | ICD-10-CM

## 2018-08-19 DIAGNOSIS — K219 Gastro-esophageal reflux disease without esophagitis: Secondary | ICD-10-CM

## 2018-08-19 DIAGNOSIS — R0683 Snoring: Secondary | ICD-10-CM

## 2018-08-19 DIAGNOSIS — F1190 Opioid use, unspecified, uncomplicated: Secondary | ICD-10-CM

## 2018-08-19 DIAGNOSIS — G4730 Sleep apnea, unspecified: Secondary | ICD-10-CM

## 2018-08-19 DIAGNOSIS — G4719 Other hypersomnia: Secondary | ICD-10-CM

## 2018-08-19 DIAGNOSIS — D582 Other hemoglobinopathies: Secondary | ICD-10-CM

## 2018-08-19 DIAGNOSIS — G4737 Central sleep apnea in conditions classified elsewhere: Secondary | ICD-10-CM

## 2018-08-19 DIAGNOSIS — R718 Other abnormality of red blood cells: Secondary | ICD-10-CM

## 2018-08-26 NOTE — Procedures (Signed)
PATIENT'S NAME:  Caitlin Moran, Caitlin Moran DOB:      September 02, 1960      MR#:    [id number]     DATE OF RECORDING: 08/19/2018 REFERRING M.D.:  Deland Pretty, MD Study Performed:   Baseline Polysomnogram HISTORY:   Kennedie Pardoe is a 58 y.o. female patient that had been seen here in 2013. She was using CPAP but lost coverage by insurance. She is seen upon a referral from Dr. Shelia Media on 05-09-2018. PCP is concerned about rising H and H and already made a referral to hematology and the work-up has thus far not indicated what caused the elevated hemoglobin and hematocrit.  The patient has a known history of psoriatic arthritis, gastroesophageal reflux disease with possible irritable bowel, kidney stones, headache disorder. She had been treated for Depression by Dr. Letta Moynahan. She is now using Trintellix.   Chief complaint according to patient:" I am looking for a reason/ cause for my H and H going up".  She endorsed today the Epworth sleepiness score at 7 points, fatigue severity much higher at 42 points. The patient's weight 239 pounds with a height of 67 (inches), resulting in a BMI of 37.4 kg/m2. The patient's neck circumference measured 16.3 inches.  CURRENT MEDICATIONS: Aspirin, Celebrex, Vitamin D, Temovate, Klonopin, Omega-3, Simponi, Norco, Multivitamin, Prilosec, Crestor, Trintellix.   PROCEDURE:  This is a multichannel digital polysomnogram utilizing the Somnostar 11.2 system.  Electrodes and sensors were applied and monitored per AASM Specifications.   EEG, EOG, Chin and Limb EMG, were sampled at 200 Hz.  ECG, Snore and Nasal Pressure, Thermal Airflow, Respiratory Effort, CPAP Flow and Pressure, Oximetry was sampled at 50 Hz. Digital video and audio were recorded.      BASELINE STUDY: Lights Out was at 22:18 and Lights On at 05:12.  Total recording time (TRT) was 415 minutes, with a total sleep time (TST) of 351.5 minutes.   The patient's sleep latency was 17.5 minutes.  REM latency was 277.5  minutes.  The sleep efficiency was 84.7 %.     SLEEP ARCHITECTURE: WASO (Wake after sleep onset) was 45.5 minutes.  There were 3.5 minutes in Stage N1, 201 minutes Stage N2, 94.5 minutes Stage N3 and 52.5 minutes in Stage REM.  The percentage of Stage N1 was 1.%, Stage N2 was 57.2%, Stage N3 was 26.9% and Stage R (REM sleep) was 14.9%.   RESPIRATORY ANALYSIS:  There were a total of 221 respiratory events:  44 obstructive apneas, 116 central apneas and 0 mixed apneas with a total of 160 apneas and an apnea index (AI) of 27.3 /hour. There were 61 hypopneas with a hypopnea index of 10.4 /hour. The total APNEA/HYPOPNEA INDEX (AHI) was 37.7 /hour.  63 events occurred in REM sleep and 174 events in NREM. The REM AHI was 0. 72 /hour, versus a non-REM AHI of 31.7. The patient spent 58.5 minutes of total sleep time in the supine position and 293 minutes in non-supine. The supine AHI was 114.9 versus a non-supine AHI of 22.3.  OXYGEN SATURATION & C02:  The Wake baseline 02 saturation was 90%, with the lowest being 75%. Time spent below 89% saturation equaled 141 minutes.  The arousals were noted as: 111 were spontaneous, 16 were associated with PLMs, and 145 were associated with respiratory events. The patient had a total of 67 Periodic Limb Movements.  The Periodic Limb Movement (PLM) index was 11.4 and the PLM Arousal index was 2.7/hour.  Audio and video analysis did show  constant movement of the legs, not all periodic, and some moaning - phonations or vocalizations.   The patient took bathroom breaks. Loud Snoring was noted. Very loud snoring in REM sleep while in lateral sleep position. Prolonged hypoxemia in REM sleep as well. EKG was in keeping with normal sinus rhythm (NSR).  IMPRESSION:  1. 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  2. 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 Loud Snoring 4. Severe Hypoxia of  sleep.   RECOMMENDATIONS:  1. Advise full-night, attended, PAP titration study to optimize therapy. The patient may need oxygen as well as PAP therapy.  Please place this patient on the list of urgent PAP candidates.    I certify that I have reviewed the entire raw data recording prior to the issuance of this report in accordance with the Standards of Accreditation of the American Academy of Sleep Medicine (AASM)      Larey Seat, MD   08-26-2018 Diplomat, American Board of Psychiatry and Neurology  Diplomat, American Board of [place] Director, [employer] [facility] [employer]

## 2018-08-26 NOTE — Addendum Note (Signed)
Addended by: Larey Seat on: 08/26/2018 06:07 PM   Modules accepted: Orders

## 2018-08-27 ENCOUNTER — Telehealth: Payer: Self-pay | Admitting: Neurology

## 2018-08-27 NOTE — Telephone Encounter (Signed)
Called patient to discuss sleep study results. No answer at this time. LVM for the patient to call back.   

## 2018-08-27 NOTE — Telephone Encounter (Signed)
-----   Message from Larey Seat, MD sent at 08/26/2018  6:07 PM EDT ----- The total APNEA/HYPOPNEA INDEX (AHI) was 37.7 /hour.  63 events occurred in REM sleep and 174 events in NREM. The REM AHI was 72 /hour, versus a non-REM AHI of 31.7. The patient spent 58.5 minutes of total sleep time in the supine position and 293 minutes in non-supine. The supine AHI was 114.9/h  versus a non-supine AHI of 22.3.  IMPRESSION:   1. Severe Complex and dominantly Central Sleep Apnea (CSA) with  hypoxia and loud gasping or grunting upon arousals. There was phonation during NREM and REM sleep.  2. Periodic and non -periodic Limb Movement Disorder (PLMD)- 3. Loud Snoring  4. Severe Hypoxia of sleep. This explains the higher H and H.    RECOMMENDATIONS:   1. Advise full-night, attended, PAP titration study to optimize  therapy. The patient may need oxygen as well as PAP therapy.   Please place this patient on the list of urgent PAP candidates.

## 2018-08-29 NOTE — Telephone Encounter (Signed)
I called pt. I advised pt that Dr. Brett Fairy reviewed their sleep study results and found that has complex sleep apnea and recommends that pt be treated with a CPAP. Dr. Brett Fairy recommends that pt return for a repeat sleep study in order to properly titrate the CPAP and ensure a good mask fit. Pt is agreeable to returning for a titration study. I advised pt that our sleep lab will file with pt's insurance and call pt to schedule the sleep study when we hear back from the pt's insurance regarding coverage of this sleep study. Pt verbalized understanding of results. Pt had no questions at this time but was encouraged to call back if questions arise. Patient was curious if we had already attempted to process the titration study through to insurance, advised that I would have to follow up on that with our sleep lab. The patient insurance starts over in July and she was hoping that she would be able to complete this study and anything necessary to get her set up with what she needed prior to insurance coverage starting over. I informed her that I would bring this to the sleep lab's attention just so that they are aware. Informed her unfortunately we haven't been given a definite date on when we are starting the titration studies and that there are other also waiting to come in for this testing as well. Pt verbalized understanding and was appreciative of me just bringing that concern to the sleep lab in event anything is able to be done.

## 2018-08-29 NOTE — Telephone Encounter (Signed)
Called patient to let her know unfortunately the sleep lab does not have an anticipated date of starting back CPAP studies. She will be added to the others that are waiting and we will call her as soon as we hate a start date. Pt understood and was okay to wait.

## 2018-10-18 ENCOUNTER — Inpatient Hospital Stay (HOSPITAL_COMMUNITY): Admission: RE | Admit: 2018-10-18 | Payer: PRIVATE HEALTH INSURANCE | Source: Ambulatory Visit

## 2018-10-19 ENCOUNTER — Other Ambulatory Visit (HOSPITAL_COMMUNITY)
Admission: RE | Admit: 2018-10-19 | Discharge: 2018-10-19 | Disposition: A | Discharge status: Home / Self Care | Payer: PRIVATE HEALTH INSURANCE | Source: Ambulatory Visit | Attending: Neurology | Admitting: Neurology

## 2018-10-19 DIAGNOSIS — Z20828 Contact with and (suspected) exposure to other viral communicable diseases: Secondary | ICD-10-CM | POA: Diagnosis not present

## 2018-10-19 DIAGNOSIS — Z01812 Encounter for preprocedural laboratory examination: Secondary | ICD-10-CM | POA: Diagnosis present

## 2018-10-20 LAB — SARS CORONAVIRUS 2 (TAT 6-24 HRS): SARS Coronavirus 2: NEGATIVE

## 2018-10-21 ENCOUNTER — Ambulatory Visit (INDEPENDENT_AMBULATORY_CARE_PROVIDER_SITE_OTHER): Payer: PRIVATE HEALTH INSURANCE | Admitting: Neurology

## 2018-10-21 ENCOUNTER — Telehealth: Payer: Self-pay | Admitting: Neurology

## 2018-10-21 DIAGNOSIS — G4731 Primary central sleep apnea: Secondary | ICD-10-CM

## 2018-10-21 DIAGNOSIS — K219 Gastro-esophageal reflux disease without esophagitis: Secondary | ICD-10-CM

## 2018-10-21 DIAGNOSIS — G4737 Central sleep apnea in conditions classified elsewhere: Secondary | ICD-10-CM

## 2018-10-21 DIAGNOSIS — R718 Other abnormality of red blood cells: Secondary | ICD-10-CM

## 2018-10-21 DIAGNOSIS — R0683 Snoring: Secondary | ICD-10-CM

## 2018-10-21 DIAGNOSIS — D582 Other hemoglobinopathies: Secondary | ICD-10-CM

## 2018-10-21 DIAGNOSIS — F1190 Opioid use, unspecified, uncomplicated: Secondary | ICD-10-CM

## 2018-10-21 NOTE — Telephone Encounter (Signed)
Called the pt to advise the COVID testing came back negative. There was no answer. LVM informing of the negative test results and advised we looked forward to seeing her for the sleep study tonight.

## 2018-10-28 NOTE — Procedures (Signed)
PATIENT'S NAME:  Caitlin Moran, Caitlin Moran DOB:      01-14-1961      MR#:    [id number]     DATE OF RECORDING: 10/21/2018 [facility] REFERRING M.D.:  Deland Pretty, MD Study Performed:   Titration to positive airway pressure. HISTORY:  Mrs. Gains returns for a CPAP Titration on 10-21-2018 following a Baseline PSG from 08/19/18. Patient had an AHI of 37.7/h, Supine AHI of 114.9/h, and total desaturation time of 141 minutes, with an oxygen nadir of 79%. 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  1. Periodic and non -periodic Limb Movement Disorder (PLMD) 2. Loud Snoring 3. Severe Hypoxia of sleep.   The patient endorsed the Epworth Sleepiness Scale at 7/24 points.   The patient's weight 239 pounds with a height of 67 (inches), resulting in a BMI of 37.4 kg/m2. The patient's neck circumference measured 16.3 inches.  CURRENT MEDICATIONS: Aspirin, Celebrex, Vitamin D, Temovate, Klonopin, Omega-3, Simponi, Norco, Multivitamin, Prilosec, Crestor, Trintellix.  PROCEDURE:  This is a multichannel digital polysomnogram utilizing the SomnoStar 11.2 system.  Electrodes and sensors were applied and monitored per AASM Specifications.   EEG, EOG, Chin and Limb EMG, were sampled at 200 Hz.  ECG, Snore and Nasal Pressure, Thermal Airflow, Respiratory Effort, CPAP Flow and Pressure, Oximetry was sampled at 50 Hz. Digital video and audio were recorded.      CPAP was initiated at 5 cmH20 with a Fisher Paykel FFM Simplus, with heated humidity per AASM split night standards and pressure was advanced to 14 cmH20 because of hypopneas, apneas and desaturations.   At a PAP pressure of 14 cmH20, there was a reduction of the AHI to 1.1/h with improvement of sleep apnea. Lights Out was at 21:36 and Lights On at 05:18. Total recording time (TRT) was 462 minutes, with a total sleep time (TST) of 438.5 minutes. The patient's sleep latency was 8 minutes. REM  latency was 98 minutes.  The sleep efficiency was 94.9 %.    SLEEP ARCHITECTURE: WASO (Wake after sleep onset) was 18.5 minutes.  There was 1 minute in Stage N1, 88.5 minutes Stage N2, 174.5 minutes Stage N3 and 174.5 minutes in Stage REM.  The percentage of Stage N1 was 0.2%, Stage N2 was 20.2%, Stage N3 was 39.8% and Stage R (REM sleep) was 39.8%.   RESPIRATORY ANALYSIS:  There was a total of 99 respiratory events: 10 obstructive apneas, 0 central apneas and 0 mixed apneas with a total of 10 apneas and an apnea index (AI) of 1.4 /hour. There were 89 hypopneas with a hypopnea index of 12.2/hour.  The total APNEA/HYPOPNEA INDEX  (AHI) was 13.5 /hour . 8 events occurred in REM sleep and 91 events in NREM. The REM AHI was 2.8 /hour versus a non-REM AHI of 20.7 /hour.  The patient spent 266.5 minutes of total sleep time in the supine position and 172 minutes in non-supine. The supine AHI was 20.0, versus a non-supine AHI of 3.5.  OXYGEN SATURATION & C02:  The baseline 02 saturation was 92%, with the lowest being 80%. Time spent below 89% saturation equaled 63 minutes.  The arousals were noted as: 32 were spontaneous, 31 were associated with PLMs, and 57 were associated with respiratory events. The patient had a total of 135 Periodic Limb Movements. The Periodic Limb Movement (PLM) index was 18.5 and the PLM Arousal index was 4.2 /hour. Audio and video analysis did not show  any abnormal or unusual movements, behaviors, phonations or vocalizations.   Loud Snoring was noted until PAP-pressures reached 10 cm water and  was positional dependent( supine).  EKG was in keeping with normal sinus rhythm (NSR). Post-study, the patient indicated that sleep was improved under CPAP.    DIAGNOSIS 1. Primary Snoring, severe Obstructive Sleep Apnea and severe Sleep Related Hypoxemia improved under CPAP at 14 cm water. The elevated hematocrit /hemoglobin is likely related to hypoxemia.   PLANS/RECOMMENDATIONS: The  patient was fitted with a Fisher and Paykel Simplus FF mask in medium. I will prescribe CPAP autotitration device with heated humidity and a pressure window from 6-16 cm water with 2 cm EPR.   1. Educate patient in sleep hygiene measures, trying to avoid supine sleep position.   2. Weight loss is recommended. 3. CPAP therapy compliance is defined as 4 hours or more of nightly use.  A follow up appointment will be scheduled in the Sleep Clinic at [facility].   Please call [phone] with any questions.      I certify that I have reviewed the entire raw data recording prior to the issuance of this report in accordance with the Standards of Accreditation of the American Academy of Sleep Medicine (AASM)   Larey Seat, M.D.    10-28-2018 Diplomat, American Board of Psychiatry and Neurology  Diplomat, [place] [employer] Medical Director, [place] [facility] at [facility]

## 2018-10-28 NOTE — Addendum Note (Signed)
Addended by: Larey Seat on: 10/28/2018 10:56 AM   Modules accepted: Orders

## 2018-10-30 ENCOUNTER — Telehealth: Payer: Self-pay | Admitting: Neurology

## 2018-10-30 NOTE — Telephone Encounter (Signed)
-----   Message from Larey Seat, MD sent at 10/28/2018 10:56 AM EDT ----- DIAGNOSIS  1. Primary Snoring, severe Obstructive Sleep Apnea and severe  Sleep Related Hypoxemia improved under CPAP at 14 cm water. The  elevated hematocrit /hemoglobin is likely related to hypoxemia.   PLANS/RECOMMENDATIONS:  The patient was fitted with a Fisher and Paykel Simplus FF mask  in medium. I will prescribe CPAP autotitration device with heated  humidity and a pressure window from 6-16 cm water with 2 cm EPR.   1. Educate patient in sleep hygiene measures, trying to avoid  supine sleep position.   2. Weight loss is recommended.  3. CPAP therapy compliance is defined as 4 hours or more of  nightly use.

## 2018-10-30 NOTE — Telephone Encounter (Signed)
Called the patient to discuss sleep study results. She was going into a dentist office. She will call back.  **When patient calls back please let her know that Dr Brett Fairy reviewed her sleep study results.  In the study we were able to sucessfully treat her sleep apnea with a CPAP  At a pressure of 14 cm.  I will send the order off for a CPAP machine to a company that accepts her insurance. Please advise the company will set her up with the machine and educate her on how to use it. We would need to schedule a follow up visit for her for insurance purposes after starting the machine in 60-90 days.  Please schedule her with Ward Givens in October, can offer the patient the blocked research slots on thursdays.**  Please let me know what date she is on and advise I will send a letter with all that information including the number to company, insurance compliance requirements with machine as well as the follow up apt date.

## 2018-11-05 NOTE — Telephone Encounter (Signed)
Called patient for 2nd time to discuss sleep study results. No answer at this time. LVM for the patient to call back.

## 2018-11-06 ENCOUNTER — Encounter: Payer: Self-pay | Admitting: Neurology

## 2018-11-06 ENCOUNTER — Telehealth: Payer: Self-pay

## 2018-11-06 NOTE — Telephone Encounter (Signed)
Called the patient back as she had left a message on the sleep lab number. There was no answer. LVM again. I am sending a letter out today as well to have her contact the main office.

## 2018-11-06 NOTE — Telephone Encounter (Signed)
I called and asked the pt if she was available to set up appt and she advised she would call back to set up appt. At a later date.

## 2018-11-06 NOTE — Telephone Encounter (Signed)
Thank you for update. We will look forward to her following up with [place] when she desires. GM

## 2018-11-06 NOTE — Telephone Encounter (Signed)
Pt returned call. I advised pt that Dr. Brett Fairy reviewed their sleep study results and found that pt was treated effectively at a pressure of 14 cm water pressure. Dr. Brett Fairy recommends that pt starts auto CPAP 6-16 cm water pressure. I reviewed PAP compliance expectations with the pt. Pt is agreeable to starting a CPAP. I advised pt that an order will be sent to a DME, [facility], and [facility] will call the pt within about one week after they file with the pt's insurance. [facility] will show the pt how to use the machine, fit for masks, and troubleshoot the CPAP if needed. A follow up appt will need to made for insurance purposes with a NP in 31-90 days after starting the machine.  Pt verbalized understanding of results. Pt had no questions at this time but was encouraged to call back if questions arise. I have sent the order to [employer] and have received confirmation that they have received the order.  Pt states that she will plan to call back 8/27 to schedule her initial CPAP follow up. We discussed scheduling this in the beginning of November with NP [facility] or Amy

## 2018-11-06 NOTE — Telephone Encounter (Signed)
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

## 2018-11-06 NOTE — Telephone Encounter (Signed)
Pt has left a voicemail returning your call about sleep study results. Pt is asking for a return call.

## 2018-12-31 ENCOUNTER — Ambulatory Visit: Payer: PRIVATE HEALTH INSURANCE | Admitting: Podiatry

## 2019-01-13 ENCOUNTER — Ambulatory Visit: Payer: PRIVATE HEALTH INSURANCE | Admitting: Family Medicine

## 2019-01-28 NOTE — Progress Notes (Signed)
PATIENT: Caitlin Moran DOB: Dec 18, 1960  REASON FOR VISIT: follow up HISTORY FROM: patient  Chief Complaint  Patient presents with  . Follow-up    Initial cpap f/u. Alone. New room. Patient mentioned that her machine has been making noises after a couple hours. She stated that it wakes her up. She has taken her machine to the DME company and they said nothing was wrong with it.      HISTORY OF PRESENT ILLNESS: Today 01/29/19 Caitlin Moran is a 58 y.o. female here today for follow up. She was recently diagnosed with severe OSA and started on CPAP. She was found to have hypoxemia that may be cause of rising H&H. She is trying to adapt to CPAP therapy. She is usually able to go to sleep without difficulty but then wakes to a loud humming sound. She will take her mask off for a couple of seconds and replace. Humming sound goes away. Otherwise she is doing well.   Compliance report dated 12/29/2018 through 01/27/2019 reveals that she used CPAP 22 of the last 30 days for compliance of 73%.  She used CPAP greater than 4 hours of 8 of the last 30 days for compliance of 27%.  Average usage was 3 hours and 12 minutes.  AHI was 2.8 on 6 to 16 cm of water and an EPR of 3.  There was no significant leak noted.  HISTORY: (copied from Dr Dohmeier's note on 05/09/2018)  HPI:  Caitlin Moran is a 58 y.o. female patient that had been seen here in 2013, was using CPAP but lost coverage by insurance.  She is seen upon a referral from Dr. Shelia Media on 05-09-2018. PCP is concerned about rising H and H.  Dr. Jake Samples had already made a referral to hematology and the work-up has thus far not indicated as a cause of the elevated hemoglobin and hematocrit.  I was able to see the last lab results that actually started in May 2018 but only the hemoglobin was mildly elevated at 15.6 the hematocrit at the time was 46.3 by November the same year hemoglobin was 15.9 and hematocrit 47.4 as of November 15th 2019 the  patient's hemoglobin had risen to 16.3 and her hematocrit of 48.6.  Normal MCV, normal MCH, normal white blood cell count and differential.  No elevation in alkaline phosphatase, normal liver function tests normal fasting glucose.  The patient has a known history of psoriatic arthritis, gastroesophageal reflux disease with possible irritable bowel, depression-anxiety in the past, kidney stones, headache disorder, she had been treated for it by Dr. Letta Moynahan since 2013 on Cymbalta 120 mg but her weight has gone up. She is now using Trintellix.  Chief complaint according to patient :" I am looking for a reason/ cause for my H and H going up".   Sleep habits are as follows: dinner time is 6-7 Pm, and bedtime 11-12 pm. She sleeps in a cool, quiet and dark bedroom.  She prefers to sleep on her side and on one pillow only.  She sleeps on a flat bed. She may sleep 12 hours but feels this has no restorative / refreshing quality any more. She has vivid dreams, nocturia 4-5 , she does not feel that her dreams wake her, she denies palpitations or diaphoresis.  On average she will get 8 to 10 hours of sleep.  She has trouble waking up and she has trouble leaving the bed in the morning creating more sleep initially.  Alarm rings at 730 AM . She has slept with the help of medication, but she can also sleep in daytime, taking naps- independent of how many hours of sleep she got at night. She wakes up with a dry mouth and headaches. She can't take naps in daytime ,not at work.    Sleep medical history; Caitlin Moran had been evaluated for sleep disordered breathing about  7 years ago and we ordered CPAP,  but she never got the benefit of using it. Anxiety, major depression. Obesity, and see above. Her previous sleep study was performed on 30 September 2011 her AHI was 23.5 there was no REM sleep noted, in supine position AHI exacerbated to 67.9.  She also had very frequent periodic limb movement arousal  7.8/h, loud snoring was noted and her EEG was unusual as it showed a lot of pseudo-spindles which are likely a medication side effect.  She return for CPAP titration study on 24 June 2012 AHI was reduced to 1.3 at a pressure of 9 cmH2O but she was unable to obtain CPAP due to insurance changes.    Family sleep history: Family history the patient's father died at age 60 with coronary artery disease he had lung cancer.  Her mother died at 56 years of age of esophageal cancer.  Her father was snoring but never officially diagnosed or evaluated for sleep apnea. No sleepwalking history.  No history of narcolepsy.   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 She endorsed today the Epworth sleepiness score at 7 points, fatigue severity much higher at 42 points.   REVIEW OF SYSTEMS: Out of a complete 14 system review of symptoms, the patient complains only of the following symptoms, none and all other reviewed systems are negative.  Epworth sleepiness scale: 6  ALLERGIES: No Known Allergies  HOME MEDICATIONS: Outpatient Medications Prior to Visit  Medication Sig Dispense Refill  . aspirin EC 81 MG tablet Take 81 mg by mouth daily.    Marland Kitchen CALCIUM PO Take 1,000 mg by mouth daily.    . celecoxib (CELEBREX) 200 MG capsule Take 200 mg by mouth daily.     . Cholecalciferol (VITAMIN D) 50 MCG (2000 UT) CAPS Take 2,000 Units by mouth daily.     . clobetasol cream (TEMOVATE) [id number] % Apply 1 application topically daily as needed (for psoriasis).     . clonazePAM (KLONOPIN) 0.5 MG tablet Take 0.5 mg by mouth at bedtime as needed for anxiety.    . DULoxetine (CYMBALTA) 60 MG capsule     . fish oil-omega-3 fatty acids 1000 MG capsule Take 1 g by mouth daily.     . Golimumab (SIMPONI) 50 MG/0.5ML SOAJ Inject 50 mg into the skin every 30 (thirty) days.     Marland Kitchen HYDROcodone-acetaminophen (NORCO) 7.5-325 MG tablet Take 1 tablet by  mouth as needed for moderate pain.   0  . Multiple Vitamins-Minerals (MULTIVITAMIN WOMEN PO) Take 1 tablet by mouth daily.    Marland Kitchen omeprazole (PRILOSEC) 20 MG capsule Take 1 capsule (20 mg total) by mouth 2 (two) times daily before a meal. 60 capsule 2  . polyethylene glycol-electrolytes (NULYTELY/GOLYTELY) 420 g solution Take 4,000 mLs by mouth as directed. 4000 mL 0  . Probiotic Product (PROBIOTIC PO) Take 1 capsule by mouth daily.    . rosuvastatin (CRESTOR) 5 MG tablet Take 5 mg by mouth daily.    Marland Kitchen  tiZANidine (ZANAFLEX) 2 MG tablet TK 1 T PO TID PRN    . vortioxetine HBr (TRINTELLIX) 20 MG TABS tablet Take 20 mg by mouth daily.     No facility-administered medications prior to visit.     PAST MEDICAL HISTORY: Past Medical History:  Diagnosis Date  . Anxiety   . Arthritis    neck  . Depression   . GERD (gastroesophageal reflux disease)   . Headache   . History of kidney stones    kidney stone -lithrotrispy 04/30/18- 2 current - stable  . IBS (irritable bowel syndrome)   . OSA (obstructive sleep apnea)    need to get CPAP  . Polycythemia vera ([place])   . PONV (postoperative nausea and vomiting)    Didn't go to sleep all the way  during colonoscopy.  . Renal calculus, right   . Right ureteral stone   . Spinal stenosis     PAST SURGICAL HISTORY: Past Surgical History:  Procedure Laterality Date  . ABLATION     unquestionable  . BIOPSY  05/01/2018   Procedure: BIOPSY;  Surgeon: Rush Landmark Telford Nab., MD;  Location: [facility];  Service: Gastroenterology;;  . CESAREAN SECTION    . CHOLECYSTECTOMY    . COLONOSCOPY    . ESOPHAGOGASTRODUODENOSCOPY    . ESOPHAGOGASTRODUODENOSCOPY N/A 05/01/2018   Procedure: ESOPHAGOGASTRODUODENOSCOPY (EGD);  Surgeon: Irving Copas., MD;  Location: [facility];  Service: Gastroenterology;  Laterality: N/A;  . EUS N/A 05/01/2018   Procedure: UPPER ENDOSCOPIC ULTRASOUND (EUS) RADIAL;  Surgeon: Rush Landmark Telford Nab., MD;  Location: [facility];  Service: Gastroenterology;  Laterality: N/A;  . EXTRACORPOREAL SHOCK WAVE LITHOTRIPSY  02/14/2010  . HYSTEROSCOPY W/D&C N/A 11/27/2012   Procedure: DILATATION AND CURETTAGE /HYSTEROSCOPY;  Surgeon: Marylynn Pearson, MD;  Location: [facility];  Service: Gynecology;  Laterality: N/A;  . WISDOM TOOTH EXTRACTION      FAMILY HISTORY: Family History  Problem Relation Age of Onset  . Breast cancer Maternal Aunt   . Esophageal cancer Mother   . Lung cancer Father   . CAD Father   . Colon cancer Neg Hx   . Inflammatory bowel disease Neg Hx   . Liver disease Neg Hx   . Pancreatic cancer Neg Hx   . Stomach cancer Neg Hx   . Rectal cancer Neg Hx     SOCIAL HISTORY: Social History   Socioeconomic History  . Marital status: Married    Spouse name: Not on file  . Number of children: 2  . Years of education: Not on file  . Highest education level: Not on file  Occupational History  . Occupation: Therapist, sports  Social Needs  . Financial resource strain: Not on file  . Food insecurity    Worry: Not on file    Inability: Not on file  . Transportation needs    Medical: Not on file    Non-medical: Not on file  Tobacco Use  . Smoking status: Former Smoker    Quit date: 11/05/1990    Years since quitting: 28.2  . Smokeless tobacco: Never Used  Substance and Sexual Activity  . Alcohol use: Yes    Alcohol/week: 7.0 standard drinks    Types: 7 Glasses of wine per week    Comment: 5  . Drug use: No  . Sexual activity: Not on file  Lifestyle  . Physical activity    Days per week: Not on file    Minutes per session: Not on file  . Stress: Not  on file  Relationships  . Social Herbalist on phone: Not on file    Gets together: Not on file    Attends religious service: Not on file    Active member of club or organization: Not on file    Attends meetings of clubs or organizations: Not on file    Relationship status: Not on file  . Intimate partner violence    Fear of current or  ex partner: Not on file    Emotionally abused: Not on file    Physically abused: Not on file    Forced sexual activity: Not on file  Other Topics Concern  . Not on file  Social History Narrative  . Not on file      PHYSICAL EXAM  Vitals:   01/29/19 0715  BP: (!) 153/85  Pulse: 63  Temp: (!) 97.4 F (36.3 C)  TempSrc: Oral  Weight: 261 lb 3.2 oz (118.5 kg)  Height: 5\' 7"  (1.702 m)   Body mass index is 40.91 kg/m.  Generalized: Well developed, in no acute distress  Cardiology: normal rate and rhythm, no murmur noted Respiratory: Clear to auscultation bilaterally Mallampati 4+ Neurological examination  Mentation: Alert oriented to time, place, history taking. Follows all commands speech and language fluent Cranial nerve II-XII: Pupils were equal round reactive to light. Extraocular movements were full, visual field were full on confrontational test. Facial sensation and strength were normal. Uvula tongue midline. Head turning and shoulder shrug  were normal and symmetric. Motor: The motor testing reveals 5 over 5 strength of all 4 extremities. Good symmetric motor tone is noted throughout.  Sensory: Sensory testing is intact to soft touch on all 4 extremities. No evidence of extinction is noted.  Coordination: Cerebellar testing reveals good finger-nose-finger and heel-to-shin bilaterally.  Gait and station: Gait is normal.   DIAGNOSTIC DATA (LABS, IMAGING, TESTING) - I reviewed patient records, labs, notes, testing and imaging myself where available.  No flowsheet data found.   Lab Results  Component Value Date   WBC 6.3 04/05/2018   HGB 15.5 (H) 04/05/2018   HCT 46.2 (H) 04/05/2018   MCV 89.7 04/05/2018   PLT 247 04/05/2018      Component Value Date/Time   NA 141 03/19/2018 1123   K 4.1 03/19/2018 1123   CL 104 03/19/2018 1123   CO2 31 03/19/2018 1123   GLUCOSE 91 03/19/2018 1123   BUN 24 (H) 03/19/2018 1123   CREATININE 0.79 03/19/2018 1123   CALCIUM 9.7  03/19/2018 1123   PROT 7.1 05/01/2018 1027   ALBUMIN 3.6 05/01/2018 1027   AST 19 05/01/2018 1027   ALT 20 05/01/2018 1027   ALKPHOS 72 05/01/2018 1027   BILITOT 1.1 05/01/2018 1027   GFRNONAA >60 10/16/2010 1600   GFRAA >60 10/16/2010 1600   No results found for: CHOL, HDL, LDLCALC, LDLDIRECT, TRIG, CHOLHDL No results found for: HGBA1C No results found for: VITAMINB12 No results found for: TSH     ASSESSMENT AND PLAN 58 y.o. year old female  has a past medical history of Anxiety, Arthritis, Depression, GERD (gastroesophageal reflux disease), Headache, History of kidney stones, IBS (irritable bowel syndrome), OSA (obstructive sleep apnea), Polycythemia vera ([place]), PONV (postoperative nausea and vomiting), Renal calculus, right, Right ureteral stone, and Spinal stenosis. here with     ICD-10-CM   1. OSA on CPAP  G47.33    Z99.89   2. High hematocrit without dehydration  R71.8     Harloe continues  to adjust to CPAP therapy.  She was concerned about a humming noise that wakes her up at night.  We have discussed possible causes including increased pressures whenever she is asleep that could increase the sounds that she is hearing.  She has taken her machine to her DME company and reports that everything appears to be functioning correctly.  We have discussed risk of untreated sleep apnea and need for compliance.  She is to continue using CPAP nightly and for greater than 4 hours each night.  She will reach out with any concerns.  She will follow-up with me in 3 months, sooner if needed.  She verbalizes understanding and agreement with this plan.   No orders of the defined types were placed in this encounter.    No orders of the defined types were placed in this encounter.     I spent 15 minutes with the patient. 50% of this time was spent counseling and educating patient on plan of care and medications.    Debbora Presto, FNP-C 01/29/2019, 7:25 AM [facility] [address], [place], [place] [address] [phone]

## 2019-01-29 ENCOUNTER — Other Ambulatory Visit: Payer: Self-pay

## 2019-01-29 ENCOUNTER — Encounter: Payer: Self-pay | Admitting: Family Medicine

## 2019-01-29 ENCOUNTER — Ambulatory Visit (INDEPENDENT_AMBULATORY_CARE_PROVIDER_SITE_OTHER): Payer: PRIVATE HEALTH INSURANCE | Admitting: Family Medicine

## 2019-01-29 VITALS — BP 153/85 | HR 63 | Temp 97.4°F | Ht 67.0 in | Wt 261.2 lb

## 2019-01-29 DIAGNOSIS — R718 Other abnormality of red blood cells: Secondary | ICD-10-CM

## 2019-01-29 DIAGNOSIS — Z9989 Dependence on other enabling machines and devices: Secondary | ICD-10-CM | POA: Diagnosis not present

## 2019-01-29 DIAGNOSIS — G4733 Obstructive sleep apnea (adult) (pediatric): Secondary | ICD-10-CM | POA: Diagnosis not present

## 2019-01-29 NOTE — Patient Instructions (Signed)
Continue CPAP nightly and for greater than 4 hours each night.  Follow up in 3 months, sooner if needed   Sleep Apnea Sleep apnea affects breathing during sleep. It causes breathing to stop for a short time or to become shallow. It can also increase the risk of:  Heart attack.  Stroke.  Being very overweight (obese).  Diabetes.  Heart failure.  Irregular heartbeat. The goal of treatment is to help you breathe normally again. What are the causes? There are three kinds of sleep apnea:  Obstructive sleep apnea. This is caused by a blocked or collapsed airway.  Central sleep apnea. This happens when the brain does not send the right signals to the muscles that control breathing.  Mixed sleep apnea. This is a combination of obstructive and central sleep apnea. The most common cause of this condition is a collapsed or blocked airway. This can happen if:  Your throat muscles are too relaxed.  Your tongue and tonsils are too large.  You are overweight.  Your airway is too small. What increases the risk?  Being overweight.  Smoking.  Having a small airway.  Being older.  Being female.  Drinking alcohol.  Taking medicines to calm yourself (sedatives or tranquilizers).  Having family members with the condition. What are the signs or symptoms?  Trouble staying asleep.  Being sleepy or tired during the day.  Getting angry a lot.  Loud snoring.  Headaches in the morning.  Not being able to focus your mind (concentrate).  Forgetting things.  Less interest in sex.  Mood swings.  Personality changes.  Feelings of sadness (depression).  Waking up a lot during the night to pee (urinate).  Dry mouth.  Sore throat. How is this diagnosed?  Your medical history.  A physical exam.  A test that is done when you are sleeping (sleep study). The test is most often done in a sleep lab but may also be done at home. How is this treated?   Sleeping on your  side.  Using a medicine to get rid of mucus in your nose (decongestant).  Avoiding the use of alcohol, medicines to help you relax, or certain pain medicines (narcotics).  Losing weight, if needed.  Changing your diet.  Not smoking.  Using a machine to open your airway while you sleep, such as: ? An oral appliance. This is a mouthpiece that shifts your lower jaw forward. ? A CPAP device. This device blows air through a mask when you breathe out (exhale). ? An EPAP device. This has valves that you put in each nostril. ? A BPAP device. This device blows air through a mask when you breathe in (inhale) and breathe out.  Having surgery if other treatments do not work. It is important to get treatment for sleep apnea. Without treatment, it can lead to:  High blood pressure.  Coronary artery disease.  In men, not being able to have an erection (impotence).  Reduced thinking ability. Follow these instructions at home: Lifestyle  Make changes that your doctor recommends.  Eat a healthy diet.  Lose weight if needed.  Avoid alcohol, medicines to help you relax, and some pain medicines.  Do not use any products that contain nicotine or tobacco, such as cigarettes, e-cigarettes, and chewing tobacco. If you need help quitting, ask your doctor. General instructions  Take over-the-counter and prescription medicines only as told by your doctor.  If you were given a machine to use while you sleep, use it only   only as told by your doctor.  If you are having surgery, make sure to tell your doctor you have sleep apnea. You may need to bring your device with you.  Keep all follow-up visits as told by your doctor. This is important. Contact a doctor if:  The machine that you were given to use during sleep bothers you or does not seem to be working.  You do not get better.  You get worse. Get help right away if:  Your chest hurts.  You have trouble breathing in enough air.  You have  an uncomfortable feeling in your back, arms, or stomach.  You have trouble talking.  One side of your body feels weak.  A part of your face is hanging down. These symptoms may be an emergency. Do not wait to see if the symptoms will go away. Get medical help right away. Call your local emergency services (911 in the [place] Do not drive yourself to the hospital. Summary  This condition affects breathing during sleep.  The most common cause is a collapsed or blocked airway.  The goal of treatment is to help you breathe normally while you sleep. This information is not intended to replace advice given to you by your health care provider. Make sure you discuss any questions you have with your health care provider. Document Released: 12/07/2007 Document Revised: 12/14/2017 Document Reviewed: 10/23/2017 Elsevier Patient Education  2020 [employer].

## 2019-01-30 ENCOUNTER — Other Ambulatory Visit: Payer: Self-pay

## 2019-01-30 ENCOUNTER — Ambulatory Visit: Payer: PRIVATE HEALTH INSURANCE | Admitting: Podiatry

## 2019-01-30 DIAGNOSIS — G5763 Lesion of plantar nerve, bilateral lower limbs: Secondary | ICD-10-CM | POA: Diagnosis not present

## 2019-02-17 ENCOUNTER — Telehealth: Payer: Self-pay | Admitting: Surgery

## 2019-02-17 NOTE — Telephone Encounter (Signed)
I called and spoke with Manley Mason at [facility]. I emailed her all progress notes and treatments tried. Call reference # [id number]   [facility]

## 2019-02-23 NOTE — Progress Notes (Signed)
Subjective:  Patient ID: Caitlin Moran, female    DOB: 1961-01-14,  MRN: [id number]  Chief Complaint  Patient presents with  . Follow-up    left foot pain , in ball of of foot (possible callus under 2nd and 3rd digit)     58 y.o. female presents with the above complaint.  History above confirmed with patient  Review of Systems: Negative except as noted in the HPI. Denies N/V/F/Ch.  Past Medical History:  Diagnosis Date  . Anxiety   . Arthritis    neck  . Depression   . GERD (gastroesophageal reflux disease)   . Headache   . History of kidney stones    kidney stone -lithrotrispy 04/30/18- 2 current - stable  . IBS (irritable bowel syndrome)   . OSA (obstructive sleep apnea)    need to get CPAP  . Polycythemia vera ([place])   . PONV (postoperative nausea and vomiting)    Didn't go to sleep all the way  during colonoscopy.  . Renal calculus, right   . Right ureteral stone   . Spinal stenosis     Current Outpatient Medications:  .  aspirin EC 81 MG tablet, Take 81 mg by mouth daily., Disp: , Rfl:  .  CALCIUM PO, Take 1,000 mg by mouth daily., Disp: , Rfl:  .  celecoxib (CELEBREX) 200 MG capsule, Take 200 mg by mouth daily. , Disp: , Rfl:  .  Cholecalciferol (VITAMIN D) 50 MCG (2000 UT) CAPS, Take 2,000 Units by mouth daily. , Disp: , Rfl:  .  clobetasol cream (TEMOVATE) [id number] %, Apply 1 application topically daily as needed (for psoriasis). , Disp: , Rfl:  .  clonazePAM (KLONOPIN) 0.5 MG tablet, Take 0.5 mg by mouth at bedtime as needed for anxiety., Disp: , Rfl:  .  DULoxetine (CYMBALTA) 60 MG capsule, , Disp: , Rfl:  .  fish oil-omega-3 fatty acids 1000 MG capsule, Take 1 g by mouth daily. , Disp: , Rfl:  .  Golimumab (SIMPONI) 50 MG/0.5ML SOAJ, Inject 50 mg into the skin every 30 (thirty) days. , Disp: , Rfl:  .  HYDROcodone-acetaminophen (NORCO) 7.5-325 MG tablet, Take 1 tablet by mouth as needed for moderate pain. , Disp: , Rfl: 0 .  Multiple Vitamins-Minerals  (MULTIVITAMIN WOMEN PO), Take 1 tablet by mouth daily., Disp: , Rfl:  .  omeprazole (PRILOSEC) 20 MG capsule, Take 1 capsule (20 mg total) by mouth 2 (two) times daily before a meal., Disp: 60 capsule, Rfl: 2 .  polyethylene glycol-electrolytes (NULYTELY/GOLYTELY) 420 g solution, Take 4,000 mLs by mouth as directed., Disp: 4000 mL, Rfl: 0 .  Probiotic Product (PROBIOTIC PO), Take 1 capsule by mouth daily., Disp: , Rfl:  .  rosuvastatin (CRESTOR) 5 MG tablet, Take 5 mg by mouth daily., Disp: , Rfl:  .  tiZANidine (ZANAFLEX) 2 MG tablet, TK 1 T PO TID PRN, Disp: , Rfl:  .  vortioxetine HBr (TRINTELLIX) 20 MG TABS tablet, Take 20 mg by mouth daily., Disp: , Rfl:   Social History   Tobacco Use  Smoking Status Former Smoker  . Quit date: 11/05/1990  . Years since quitting: 28.3  Smokeless Tobacco Never Used    No Known Allergies Objective:   There were no vitals filed for this visit. There is no height or weight on file to calculate BMI. Constitutional Well developed. Well nourished.  Vascular Dorsalis pedis pulses palpable bilaterally. Posterior tibial pulses palpable bilaterally. Capillary refill normal to all digits.  No  cyanosis or clubbing noted. Pedal hair growth normal.  Neurologic Normal speech. Oriented to person, place, and time. Epicritic sensation to light touch grossly present bilaterally.  Dermatologic Nails well groomed and normal in appearance. No open wounds. No skin lesions.  Orthopedic: POP 3rd interspace bilat with Mulder's click. L>R   Radiographs: None Assessment:   1. Morton's neuroma of both feet    Plan:  Patient was evaluated and treated and all questions answered.  Interdigital Neuroma,  Left -Injection delivered as below -Discussed follow-up as needed  Procedure: Neuroma Injection Location: Left 3rd interspace Skin Prep: Alcohol. Injectate: 0.5 cc 0.5% marcaine plain, 0.5 cc dexamethasone phosphate. Disposition: Patient tolerated procedure  well. Injection site dressed with a band-aid.   No follow-ups on file.

## 2019-04-09 DIAGNOSIS — L409 Psoriasis, unspecified: Secondary | ICD-10-CM | POA: Insufficient documentation

## 2019-04-30 ENCOUNTER — Telehealth: Payer: Self-pay

## 2019-04-30 NOTE — Telephone Encounter (Signed)
Spoke with the patient and she stated that she would call us back to r/s her appt. Patient is aware that our office is closed tomorrow morning.    If patient calls back please r/s her appt with Amy.

## 2019-05-01 ENCOUNTER — Ambulatory Visit: Payer: PRIVATE HEALTH INSURANCE | Admitting: Family Medicine

## 2019-05-15 ENCOUNTER — Other Ambulatory Visit: Payer: Self-pay

## 2019-05-15 ENCOUNTER — Ambulatory Visit (INDEPENDENT_AMBULATORY_CARE_PROVIDER_SITE_OTHER): Payer: PRIVATE HEALTH INSURANCE | Admitting: Podiatry

## 2019-05-15 DIAGNOSIS — G5762 Lesion of plantar nerve, left lower limb: Secondary | ICD-10-CM

## 2019-06-05 ENCOUNTER — Other Ambulatory Visit: Payer: Self-pay

## 2019-06-05 ENCOUNTER — Ambulatory Visit (INDEPENDENT_AMBULATORY_CARE_PROVIDER_SITE_OTHER): Payer: PRIVATE HEALTH INSURANCE | Admitting: Podiatry

## 2019-06-05 VITALS — Temp 98.0°F

## 2019-06-05 DIAGNOSIS — M7752 Other enthesopathy of left foot: Secondary | ICD-10-CM

## 2019-06-05 NOTE — Progress Notes (Signed)
  Subjective:  Patient ID: Caitlin Moran, female    DOB: 08-01-1960,  MRN: [id number]  Chief Complaint  Patient presents with  . Follow-up    Pain - L plantar forefoot. Pt stated, "I'm still having burning pain - 6-8/10 most of the time. I wonder if my callus is deeper. I'm still adjusting to my inserts".    59 y.o. female presents with the above complaint. History confirmed with patient. States the pain is improving with the orthotics but she is getting used to them.  Objective:  Physical Exam: warm, good capillary refill, no trophic changes or ulcerative lesions, normal DP and PT pulses and normal sensory exam. Left Foot: mild tenderness 2nd MPJ plantarly.   Assessment:   1. Capsulitis of metatarsophalangeal (MTP) joint of left foot    Plan:  Patient was evaluated and treated and all questions answered.  Capsulitis L 2nd MPJ -Pain improving. Continue CMOs. -Minimal paring of callus area today. Done courtesy no procedural debridement. -F/u PRN for repeat injection.  Return if symptoms worsen or fail to improve.

## 2019-06-05 NOTE — Progress Notes (Signed)
  Subjective:  Patient ID: Caitlin Moran, female    DOB: Nov 20, 1960,  MRN: [id number]  Chief Complaint  Patient presents with  . Neuroma    Left foot neuroma pain has returned, pt states previously injection has helped.    59 y.o. female presents with the above complaint. History confirmed with patient.   Objective:  Physical Exam: warm, good capillary refill, no trophic changes or ulcerative lesions, normal DP and PT pulses and normal sensory exam. Left Foot: tenderness of the 2nd metatarsal head and tenderness between the 3rd and 4th metatarsal head    Assessment:   1. Morton neuroma, left    Plan:  Patient was evaluated and treated and all questions answered.  Morton Neuroma -Injection delivered to the affected interspaces  Procedure: Neuroma Injection Location: Left 3rd interspace Skin Prep: Alcohol. Injectate: 0.5 cc 0.5% marcaine plain, 0.5 cc dexamethasone phosphate. Disposition: Patient tolerated procedure well. Injection site dressed with a band-aid.  Return in about 3 weeks (around 06/05/2019).

## 2019-06-25 NOTE — Progress Notes (Signed)
PATIENT: Caitlin Moran DOB: Nov 22, 1960  REASON FOR VISIT: follow up HISTORY FROM: patient  Chief Complaint  Patient presents with  . OSA on CPAP    rm 7, 3 monh FU "no problems with CPAP"     HISTORY OF PRESENT ILLNESS: Today 06/26/19 Caitlin Moran is a 58 y.o. female here today for follow up for OSA on CPAP therapy.  She reports that she is doing very well.  She has no difficulty with her machine or supplies.  She does admit that compliance remains her biggest concern.  She just cannot seem to get in the habit of using her machine every night.  When she does use it she typically takes her mask off in the middle the night.  She feels that this is simply related to being inconsistent with these.  Compliance report dated 03/27/2019 through 06/24/2019 reveals that she used CPAP therapy 36 of the past 90 nights for compliance of 40%.  She used CPAP greater than 4 hours 9 of the past 90 nights for compliance of 10%.  Average usage on days used was 3 hours and 21 minutes.  Residual AHI was 3.7 on 6 to 16 cm of water and an EPR of 3.  There was no significant leak noted.    HISTORY: (copied from my note on 01/29/2019)  Caitlin Moran is a 59 y.o. female here today for follow up. She was recently diagnosed with severe OSA and started on CPAP. She was found to have hypoxemia that may be cause of rising H&H. She is trying to adapt to CPAP therapy. She is usually able to go to sleep without difficulty but then wakes to a loud humming sound. She will take her mask off for a couple of seconds and replace. Humming sound goes away. Otherwise she is doing well.   Compliance report dated 12/29/2018 through 01/27/2019 reveals that she used CPAP 22 of the last 30 days for compliance of 73%.  She used CPAP greater than 4 hours of 8 of the last 30 days for compliance of 27%.  Average usage was 3 hours and 12 minutes.  AHI was 2.8 on 6 to 16 cm of water and an EPR of 3.  There was no significant  leak noted.   REVIEW OF SYSTEMS: Out of a complete 14 system review of symptoms, the patient complains only of the following symptoms, none and all other reviewed systems are negative.  ESS: 6 FSS: 45  ALLERGIES: No Known Allergies  HOME MEDICATIONS: Outpatient Medications Prior to Visit  Medication Sig Dispense Refill  . aspirin EC 81 MG tablet Take 81 mg by mouth daily.    Marland Kitchen CALCIUM PO Take 1,000 mg by mouth daily.    . celecoxib (CELEBREX) 200 MG capsule Take 200 mg by mouth daily.     . Cholecalciferol (VITAMIN D) 50 MCG (2000 UT) CAPS Take 2,000 Units by mouth daily.     . clobetasol cream (TEMOVATE) [id number] % Apply 1 application topically daily as needed (for psoriasis).     . clonazePAM (KLONOPIN) 0.5 MG tablet Take 0.5 mg by mouth at bedtime as needed for anxiety.    . fish oil-omega-3 fatty acids 1000 MG capsule Take 1 g by mouth daily.     . Golimumab (SIMPONI) 50 MG/0.5ML SOAJ Inject 50 mg into the skin every 30 (thirty) days.     Marland Kitchen HYDROcodone-acetaminophen (NORCO) 7.5-325 MG tablet Take 1 tablet by mouth as needed for moderate  pain.   0  . Multiple Vitamins-Minerals (MULTIVITAMIN WOMEN PO) Take 1 tablet by mouth daily.    Marland Kitchen omeprazole (PRILOSEC) 20 MG capsule Take 1 capsule (20 mg total) by mouth 2 (two) times daily before a meal. 60 capsule 2  . Probiotic Product (PROBIOTIC PO) Take 1 capsule by mouth daily.    . rosuvastatin (CRESTOR) 5 MG tablet Take 5 mg by mouth daily.    Marland Kitchen tiZANidine (ZANAFLEX) 2 MG tablet TK 1 T PO TID PRN    . vortioxetine HBr (TRINTELLIX) 20 MG TABS tablet Take 20 mg by mouth daily.    . DULoxetine (CYMBALTA) 60 MG capsule      No facility-administered medications prior to visit.    PAST MEDICAL HISTORY: Past Medical History:  Diagnosis Date  . Anxiety   . Arthritis    neck  . Depression   . GERD (gastroesophageal reflux disease)   . Headache   . History of kidney stones    kidney stone -lithrotrispy 04/30/18- 2 current - stable  . IBS  (irritable bowel syndrome)   . OSA (obstructive sleep apnea)    on CPAP  . Polycythemia vera ([place])   . PONV (postoperative nausea and vomiting)    Didn't go to sleep all the way  during colonoscopy.  . Renal calculus, right   . Right ureteral stone   . Spinal stenosis     PAST SURGICAL HISTORY: Past Surgical History:  Procedure Laterality Date  . ABLATION     unquestionable  . BIOPSY  05/01/2018   Procedure: BIOPSY;  Surgeon: Rush Landmark Telford Nab., MD;  Location: [facility];  Service: Gastroenterology;;  . CESAREAN SECTION    . CHOLECYSTECTOMY    . COLONOSCOPY    . ESOPHAGOGASTRODUODENOSCOPY    . ESOPHAGOGASTRODUODENOSCOPY N/A 05/01/2018   Procedure: ESOPHAGOGASTRODUODENOSCOPY (EGD);  Surgeon: Irving Copas., MD;  Location: [facility];  Service: Gastroenterology;  Laterality: N/A;  . EUS N/A 05/01/2018   Procedure: UPPER ENDOSCOPIC ULTRASOUND (EUS) RADIAL;  Surgeon: Rush Landmark Telford Nab., MD;  Location: [facility];  Service: Gastroenterology;  Laterality: N/A;  . EXTRACORPOREAL SHOCK WAVE LITHOTRIPSY  02/14/2010  . HYSTEROSCOPY WITH D & C N/A 11/27/2012   Procedure: DILATATION AND CURETTAGE /HYSTEROSCOPY;  Surgeon: Marylynn Pearson, MD;  Location: [facility];  Service: Gynecology;  Laterality: N/A;  . WISDOM TOOTH EXTRACTION      FAMILY HISTORY: Family History  Problem Relation Age of Onset  . Breast cancer Maternal Aunt   . Esophageal cancer Mother   . Lung cancer Father   . CAD Father   . Colon cancer Neg Hx   . Inflammatory bowel disease Neg Hx   . Liver disease Neg Hx   . Pancreatic cancer Neg Hx   . Stomach cancer Neg Hx   . Rectal cancer Neg Hx     SOCIAL HISTORY: Social History   Socioeconomic History  . Marital status: Married    Spouse name: Not on file  . Number of children: 2  . Years of education: Not on file  . Highest education level: Not on file  Occupational History  . Occupation: Therapist, sports  Tobacco Use  . Smoking status: Former Smoker     Quit date: 11/05/1990    Years since quitting: 28.6  . Smokeless tobacco: Never Used  Substance and Sexual Activity  . Alcohol use: Yes    Alcohol/week: 7.0 standard drinks    Types: 7 Glasses of wine per week    Comment: 5  .  Drug use: No  . Sexual activity: Not on file  Other Topics Concern  . Not on file  Social History Narrative  . Not on file   Social Determinants of Health   Financial Resource Strain:   . Difficulty of Paying Living Expenses:   Food Insecurity:   . Worried About Charity fundraiser in the Last Year:   . Arboriculturist in the Last Year:   Transportation Needs:   . Film/video editor (Medical):   Marland Kitchen Lack of Transportation (Non-Medical):   Physical Activity:   . Days of Exercise per Week:   . Minutes of Exercise per Session:   Stress:   . Feeling of Stress :   Social Connections:   . Frequency of Communication with Friends and Family:   . Frequency of Social Gatherings with Friends and Family:   . Attends Religious Services:   . Active Member of Clubs or Organizations:   . Attends Archivist Meetings:   Marland Kitchen Marital Status:   Intimate Partner Violence:   . Fear of Current or Ex-Partner:   . Emotionally Abused:   Marland Kitchen Physically Abused:   . Sexually Abused:       PHYSICAL EXAM  Vitals:   06/26/19 0949  BP: (!) 137/92  Pulse: 67  Temp: 98 F (36.7 C)  Weight: 253 lb 3.2 oz (114.9 kg)  Height: 5\' 7"  (1.702 m)   Body mass index is 39.66 kg/m.  Generalized: Well developed, in no acute distress  Cardiology: normal rate and rhythm, no murmur noted Respiratory: Clear to auscultation bilaterally Neurological examination  Mentation: Alert oriented to time, place, history taking. Follows all commands speech and language fluent Cranial nerve II-XII: Pupils were equal round reactive to light. Extraocular movements were full, visual field were full  Motor: The motor testing reveals 5 over 5 strength of all 4 extremities. Good symmetric  motor tone is noted throughout.  Gait and station: Gait is normal.   DIAGNOSTIC DATA (LABS, IMAGING, TESTING) - I reviewed patient records, labs, notes, testing and imaging myself where available.  No flowsheet data found.   Lab Results  Component Value Date   WBC 6.3 04/05/2018   HGB 15.5 (H) 04/05/2018   HCT 46.2 (H) 04/05/2018   MCV 89.7 04/05/2018   PLT 247 04/05/2018      Component Value Date/Time   NA 141 03/19/2018 1123   K 4.1 03/19/2018 1123   CL 104 03/19/2018 1123   CO2 31 03/19/2018 1123   GLUCOSE 91 03/19/2018 1123   BUN 24 (H) 03/19/2018 1123   CREATININE 0.79 03/19/2018 1123   CALCIUM 9.7 03/19/2018 1123   PROT 7.1 05/01/2018 1027   ALBUMIN 3.6 05/01/2018 1027   AST 19 05/01/2018 1027   ALT 20 05/01/2018 1027   ALKPHOS 72 05/01/2018 1027   BILITOT 1.1 05/01/2018 1027   GFRNONAA >60 10/16/2010 1600   GFRAA >60 10/16/2010 1600   No results found for: CHOL, HDL, LDLCALC, LDLDIRECT, TRIG, CHOLHDL No results found for: HGBA1C No results found for: VITAMINB12 No results found for: TSH     ASSESSMENT AND PLAN 59 y.o. year old female  has a past medical history of Anxiety, Arthritis, Depression, GERD (gastroesophageal reflux disease), Headache, History of kidney stones, IBS (irritable bowel syndrome), OSA (obstructive sleep apnea), Polycythemia vera ([place]), PONV (postoperative nausea and vomiting), Renal calculus, right, Right ureteral stone, and Spinal stenosis. here with     ICD-10-CM   1. OSA on  CPAP  G47.33    Z99.89     Daughter reports that she is doing very well using CPAP therapy.  She has no concerns with her machine or the mask.  She admits that she just needs to work on compliance.  Compliance report over the past 90 days does reveal suboptimal compliance with both daily use and 4-hour use.  We have discussed insurance requirements of meeting 70% compliance.  I have readdressed risk of untreated sleep apnea.  She was encouraged to use CPAP nightly  and for greater than 4 hours each night.  We have discussed following up in 3 months but patient is more comfortable with 69-month follow-up.  She will focus on compliance and call me with any concerns.  She verbalizes understanding and agreement with this plan.   No orders of the defined types were placed in this encounter.    No orders of the defined types were placed in this encounter.     I spent 15 minutes with the patient. 50% of this time was spent counseling and educating patient on plan of care and medications.    Debbora Presto, FNP-C 06/26/2019, 10:11 AM [facility] [address], [place], [place] [address] [phone]

## 2019-06-26 ENCOUNTER — Other Ambulatory Visit: Payer: Self-pay

## 2019-06-26 ENCOUNTER — Ambulatory Visit (INDEPENDENT_AMBULATORY_CARE_PROVIDER_SITE_OTHER): Payer: PRIVATE HEALTH INSURANCE | Admitting: Family Medicine

## 2019-06-26 ENCOUNTER — Encounter: Payer: Self-pay | Admitting: Family Medicine

## 2019-06-26 VITALS — BP 137/92 | HR 67 | Temp 98.0°F | Ht 67.0 in | Wt 253.2 lb

## 2019-06-26 DIAGNOSIS — Z9989 Dependence on other enabling machines and devices: Secondary | ICD-10-CM | POA: Diagnosis not present

## 2019-06-26 DIAGNOSIS — G4733 Obstructive sleep apnea (adult) (pediatric): Secondary | ICD-10-CM

## 2019-06-26 NOTE — Patient Instructions (Signed)
Please continue using your CPAP regularly. While your insurance requires that you use CPAP at least 4 hours each night on 70% of the nights, I recommend, that you not skip any nights and use it throughout the night if you can. Getting used to CPAP and staying with the treatment long term does take time and patience and discipline. Untreated obstructive sleep apnea when it is moderate to severe can have an adverse impact on cardiovascular health and raise her risk for heart disease, arrhythmias, hypertension, congestive heart failure, stroke and diabetes. Untreated obstructive sleep apnea causes sleep disruption, nonrestorative sleep, and sleep deprivation. This can have an impact on your day to day functioning and cause daytime sleepiness and impairment of cognitive function, memory loss, mood disturbance, and problems focussing. Using CPAP regularly can improve these symptoms.   Follow up in 6 months    Sleep Apnea Sleep apnea affects breathing during sleep. It causes breathing to stop for a short time or to become shallow. It can also increase the risk of:  Heart attack.  Stroke.  Being very overweight (obese).  Diabetes.  Heart failure.  Irregular heartbeat. The goal of treatment is to help you breathe normally again. What are the causes? There are three kinds of sleep apnea:  Obstructive sleep apnea. This is caused by a blocked or collapsed airway.  Central sleep apnea. This happens when the brain does not send the right signals to the muscles that control breathing.  Mixed sleep apnea. This is a combination of obstructive and central sleep apnea. The most common cause of this condition is a collapsed or blocked airway. This can happen if:  Your throat muscles are too relaxed.  Your tongue and tonsils are too large.  You are overweight.  Your airway is too small. What increases the risk?  Being overweight.  Smoking.  Having a small airway.  Being older.  Being  female.  Drinking alcohol.  Taking medicines to calm yourself (sedatives or tranquilizers).  Having family members with the condition. What are the signs or symptoms?  Trouble staying asleep.  Being sleepy or tired during the day.  Getting angry a lot.  Loud snoring.  Headaches in the morning.  Not being able to focus your mind (concentrate).  Forgetting things.  Less interest in sex.  Mood swings.  Personality changes.  Feelings of sadness (depression).  Waking up a lot during the night to pee (urinate).  Dry mouth.  Sore throat. How is this diagnosed?  Your medical history.  A physical exam.  A test that is done when you are sleeping (sleep study). The test is most often done in a sleep lab but may also be done at home. How is this treated?   Sleeping on your side.  Using a medicine to get rid of mucus in your nose (decongestant).  Avoiding the use of alcohol, medicines to help you relax, or certain pain medicines (narcotics).  Losing weight, if needed.  Changing your diet.  Not smoking.  Using a machine to open your airway while you sleep, such as: ? An oral appliance. This is a mouthpiece that shifts your lower jaw forward. ? A CPAP device. This device blows air through a mask when you breathe out (exhale). ? An EPAP device. This has valves that you put in each nostril. ? A BPAP device. This device blows air through a mask when you breathe in (inhale) and breathe out.  Having surgery if other treatments do not work. It   is important to get treatment for sleep apnea. Without treatment, it can lead to:  High blood pressure.  Coronary artery disease.  In men, not being able to have an erection (impotence).  Reduced thinking ability. Follow these instructions at home: Lifestyle  Make changes that your doctor recommends.  Eat a healthy diet.  Lose weight if needed.  Avoid alcohol, medicines to help you relax, and some pain  medicines.  Do not use any products that contain nicotine or tobacco, such as cigarettes, e-cigarettes, and chewing tobacco. If you need help quitting, ask your doctor. General instructions  Take over-the-counter and prescription medicines only as told by your doctor.  If you were given a machine to use while you sleep, use it only as told by your doctor.  If you are having surgery, make sure to tell your doctor you have sleep apnea. You may need to bring your device with you.  Keep all follow-up visits as told by your doctor. This is important. Contact a doctor if:  The machine that you were given to use during sleep bothers you or does not seem to be working.  You do not get better.  You get worse. Get help right away if:  Your chest hurts.  You have trouble breathing in enough air.  You have an uncomfortable feeling in your back, arms, or stomach.  You have trouble talking.  One side of your body feels weak.  A part of your face is hanging down. These symptoms may be an emergency. Do not wait to see if the symptoms will go away. Get medical help right away. Call your local emergency services (911 in the [place] Do not drive yourself to the hospital. Summary  This condition affects breathing during sleep.  The most common cause is a collapsed or blocked airway.  The goal of treatment is to help you breathe normally while you sleep. This information is not intended to replace advice given to you by your health care provider. Make sure you discuss any questions you have with your health care provider. Document Revised: 12/14/2017 Document Reviewed: 10/23/2017 Elsevier Patient Education  2020 [employer].  

## 2019-09-12 ENCOUNTER — Ambulatory Visit: Payer: PRIVATE HEALTH INSURANCE | Admitting: Podiatry

## 2019-09-12 ENCOUNTER — Encounter: Payer: Self-pay | Admitting: Podiatry

## 2019-10-03 ENCOUNTER — Ambulatory Visit: Payer: PRIVATE HEALTH INSURANCE | Admitting: Podiatry

## 2019-11-23 ENCOUNTER — Other Ambulatory Visit: Payer: Self-pay

## 2019-11-23 ENCOUNTER — Ambulatory Visit: Payer: PRIVATE HEALTH INSURANCE | Attending: Internal Medicine

## 2019-11-23 DIAGNOSIS — Z23 Encounter for immunization: Secondary | ICD-10-CM

## 2019-11-23 NOTE — Progress Notes (Signed)
   Covid-19 Vaccination Clinic  Name:  Caitlin Moran    MRN: [id number] DOB: [birth year]  11/23/2019  [name] was observed post Covid-19 immunization for 15 minutes without incident. She was provided with Vaccine Information Sheet and instruction to access the V-Safe system.   [name] was instructed to call 911 with any severe reactions post vaccine: Marland Kitchen Difficulty breathing  . Swelling of face and throat  . A fast heartbeat  . A bad rash all over body  . Dizziness and weakness

## 2019-12-29 ENCOUNTER — Ambulatory Visit (INDEPENDENT_AMBULATORY_CARE_PROVIDER_SITE_OTHER): Payer: PRIVATE HEALTH INSURANCE | Admitting: Family Medicine

## 2019-12-29 ENCOUNTER — Encounter: Payer: Self-pay | Admitting: Family Medicine

## 2019-12-29 VITALS — BP 149/88 | HR 69 | Ht 67.0 in | Wt 253.8 lb

## 2019-12-29 DIAGNOSIS — G4733 Obstructive sleep apnea (adult) (pediatric): Secondary | ICD-10-CM | POA: Diagnosis not present

## 2019-12-29 DIAGNOSIS — Z9989 Dependence on other enabling machines and devices: Secondary | ICD-10-CM | POA: Diagnosis not present

## 2019-12-29 NOTE — Progress Notes (Signed)
PATIENT: Caitlin Moran DOB: May 12, 1960  REASON FOR VISIT: follow up HISTORY FROM: patient  Chief Complaint  Patient presents with  . Follow-up    OSA CPAP, DME  Aerocare/adapt.  Is wearing every night, less then 4 hours at times.      HISTORY OF PRESENT ILLNESS: Today 12/29/19 Caitlin Moran is a 59 y.o. female here today for follow up for OSA on CPAP.  She admits that she continues to struggle with 4-hour compliance.  She reports that she will wake up in the middle of the night and is unable to get back to sleep because she can hear the machine running.  She is currently using a full facemask and has noted a leak.  She is interested in trying a new mask.  She is motivated to continue using CPAP therapy.  She is uncertain if she has noted significant benefit but knows risk of untreated sleep apnea.  Compliance report dated 11/17/2019 through 12/16/2019 reveals that she used CPAP 23 of the past 30 days for compliance of 77%.  She is CPAP greater than 4 hours 8 of the past 30 days for compliance of 27%.  Average usage on days used was 3 hours and 27 minutes.  Residual AHI was 2.0 on 6 to 16 cm of water and an EPR of 3.  There was a significant leak noted in the 95th percentile of 37.1 L/min.   HISTORY: (copied from my note on 06/26/2019)  Caitlin Moran is a 59 y.o. female here today for follow up for OSA on CPAP therapy.  She reports that she is doing very well.  She has no difficulty with her machine or supplies.  She does admit that compliance remains her biggest concern.  She just cannot seem to get in the habit of using her machine every night.  When she does use it she typically takes her mask off in the middle the night.  She feels that this is simply related to being inconsistent with these.  Compliance report dated 03/27/2019 through 06/24/2019 reveals that she used CPAP therapy 36 of the past 90 nights for compliance of 40%.  She used CPAP greater than 4 hours 9 of the past  90 nights for compliance of 10%.  Average usage on days used was 3 hours and 21 minutes.  Residual AHI was 3.7 on 6 to 16 cm of water and an EPR of 3.  There was no significant leak noted.    HISTORY: (copied from my note on 01/29/2019)  Caitlin Moran a 59 y.o.femalehere today for follow up. She was recently diagnosed with severe OSA and started on CPAP. She was found to have hypoxemia that may be cause of rising H&H.She is trying to adapt to CPAP therapy. She is usually able to go to sleep without difficulty but then wakes to a loud humming sound. She will take her mask off for a couple of seconds and replace. Humming sound goes away. Otherwise she is doing well.  Compliance report dated 12/29/2018 through 01/27/2019 reveals that she used CPAP 22 of the last 30 days for compliance of 73%. She used CPAP greater than 4 hours of 8 of the last 30 days for compliance of 27%. Average usage was 3 hours and 12 minutes. AHI was 2.8 on 6 to 16 cm of water and an EPR of 3. There was no significant leak noted.    REVIEW OF SYSTEMS: Out of a complete 14 system review  of symptoms, the patient complains only of the following symptoms, and all other reviewed systems are negative.  ALLERGIES: No Known Allergies  HOME MEDICATIONS: Outpatient Medications Prior to Visit  Medication Sig Dispense Refill  . aspirin EC 81 MG tablet Take 81 mg by mouth daily.    Marland Kitchen CALCIUM PO Take 1,000 mg by mouth daily.    . celecoxib (CELEBREX) 200 MG capsule Take 200 mg by mouth daily.     . Cholecalciferol (VITAMIN D) 50 MCG (2000 UT) CAPS Take 2,000 Units by mouth daily.     . clobetasol cream (TEMOVATE) 5.36 % Apply 1 application topically daily as needed (for psoriasis).     . clonazePAM (KLONOPIN) 0.5 MG tablet Take 0.5 mg by mouth at bedtime as needed for anxiety.    . fish oil-omega-3 fatty acids 1000 MG capsule Take 1 g by mouth daily.     . Golimumab (SIMPONI) 50 MG/0.5ML SOAJ Inject 50 mg into  the skin every 30 (thirty) days.     Marland Kitchen HYDROcodone-acetaminophen (NORCO) 7.5-325 MG tablet Take 1 tablet by mouth as needed for moderate pain.   0  . Multiple Vitamins-Minerals (MULTIVITAMIN WOMEN PO) Take 1 tablet by mouth daily.    Marland Kitchen omeprazole (PRILOSEC) 20 MG capsule Take 1 capsule (20 mg total) by mouth 2 (two) times daily before a meal. 60 capsule 2  . Probiotic Product (PROBIOTIC PO) Take 1 capsule by mouth daily.    . rosuvastatin (CRESTOR) 5 MG tablet Take 5 mg by mouth daily.    Marland Kitchen tiZANidine (ZANAFLEX) 2 MG tablet TK 1 T PO TID PRN    . vortioxetine HBr (TRINTELLIX) 20 MG TABS tablet Take 20 mg by mouth daily.     No facility-administered medications prior to visit.    PAST MEDICAL HISTORY: Past Medical History:  Diagnosis Date  . Anxiety   . Arthritis    neck  . Depression   . GERD (gastroesophageal reflux disease)   . Headache   . History of kidney stones    kidney stone -lithrotrispy 04/30/18- 2 current - stable  . IBS (irritable bowel syndrome)   . OSA (obstructive sleep apnea)    on CPAP  . Polycythemia vera ([place])   . PONV (postoperative nausea and vomiting)    Didn't go to sleep all the way  during colonoscopy.  . Renal calculus, right   . Right ureteral stone   . Spinal stenosis     PAST SURGICAL HISTORY: Past Surgical History:  Procedure Laterality Date  . ABLATION     unquestionable  . BIOPSY  05/01/2018   Procedure: BIOPSY;  Surgeon: Rush Landmark Telford Nab., MD;  Location: [facility];  Service: Gastroenterology;;  . CESAREAN SECTION    . CHOLECYSTECTOMY    . COLONOSCOPY    . ESOPHAGOGASTRODUODENOSCOPY    . ESOPHAGOGASTRODUODENOSCOPY N/A 05/01/2018   Procedure: ESOPHAGOGASTRODUODENOSCOPY (EGD);  Surgeon: Irving Copas., MD;  Location: [facility];  Service: Gastroenterology;  Laterality: N/A;  . EUS N/A 05/01/2018   Procedure: UPPER ENDOSCOPIC ULTRASOUND (EUS) RADIAL;  Surgeon: Rush Landmark Telford Nab., MD;  Location: [facility];  Service:  Gastroenterology;  Laterality: N/A;  . EXTRACORPOREAL SHOCK WAVE LITHOTRIPSY  02/14/2010  . HYSTEROSCOPY WITH D & C N/A 11/27/2012   Procedure: DILATATION AND CURETTAGE /HYSTEROSCOPY;  Surgeon: Marylynn Pearson, MD;  Location: [facility];  Service: Gynecology;  Laterality: N/A;  . WISDOM TOOTH EXTRACTION      FAMILY HISTORY: Family History  Problem Relation Age of Onset  .  Breast cancer Maternal Aunt   . Esophageal cancer Mother   . Lung cancer Father   . CAD Father   . Colon cancer Neg Hx   . Inflammatory bowel disease Neg Hx   . Liver disease Neg Hx   . Pancreatic cancer Neg Hx   . Stomach cancer Neg Hx   . Rectal cancer Neg Hx     SOCIAL HISTORY: Social History   Socioeconomic History  . Marital status: Married    Spouse name: Not on file  . Number of children: 2  . Years of education: Not on file  . Highest education level: Not on file  Occupational History  . Occupation: Therapist, sports  Tobacco Use  . Smoking status: Former Smoker    Quit date: 11/05/1990    Years since quitting: 29.1  . Smokeless tobacco: Never Used  Vaping Use  . Vaping Use: Never used  Substance and Sexual Activity  . Alcohol use: Yes    Alcohol/week: 7.0 standard drinks    Types: 7 Glasses of wine per week    Comment: 5  . Drug use: No  . Sexual activity: Not on file  Other Topics Concern  . Not on file  Social History Narrative  . Not on file   Social Determinants of Health   Financial Resource Strain:   . Difficulty of Paying Living Expenses: Not on file  Food Insecurity:   . Worried About Charity fundraiser in the Last Year: Not on file  . Ran Out of Food in the Last Year: Not on file  Transportation Needs:   . Lack of Transportation (Medical): Not on file  . Lack of Transportation (Non-Medical): Not on file  Physical Activity:   . Days of Exercise per Week: Not on file  . Minutes of Exercise per Session: Not on file  Stress:   . Feeling of Stress : Not on file  Social Connections:   .  Frequency of Communication with Friends and Family: Not on file  . Frequency of Social Gatherings with Friends and Family: Not on file  . Attends Religious Services: Not on file  . Active Member of Clubs or Organizations: Not on file  . Attends Archivist Meetings: Not on file  . Marital Status: Not on file  Intimate Partner Violence:   . Fear of Current or Ex-Partner: Not on file  . Emotionally Abused: Not on file  . Physically Abused: Not on file  . Sexually Abused: Not on file     PHYSICAL EXAM  Vitals:   12/29/19 0810  BP: (!) 149/88  Pulse: 69  Weight: 253 lb 12.8 oz (115.1 kg)  Height: 5\' 7"  (1.702 m)   Body mass index is 39.75 kg/m.  Generalized: Well developed, in no acute distress  Cardiology: normal rate and rhythm, no murmur noted Respiratory: clear to auscultation bilaterally  Neurological examination  Mentation: Alert oriented to time, place, history taking. Follows all commands speech and language fluent Cranial nerve II-XII: Pupils were equal round reactive to light. Extraocular movements were full, visual field were full  Motor: The motor testing reveals 5 over 5 strength of all 4 extremities. Good symmetric motor tone is noted throughout.  Gait and station: Gait is normal.    DIAGNOSTIC DATA (LABS, IMAGING, TESTING) - I reviewed patient records, labs, notes, testing and imaging myself where available.  No flowsheet data found.   Lab Results  Component Value Date   WBC 6.3 04/05/2018  HGB 15.5 (H) 04/05/2018   HCT 46.2 (H) 04/05/2018   MCV 89.7 04/05/2018   PLT 247 04/05/2018      Component Value Date/Time   NA 141 03/19/2018 1123   K 4.1 03/19/2018 1123   CL 104 03/19/2018 1123   CO2 31 03/19/2018 1123   GLUCOSE 91 03/19/2018 1123   BUN 24 (H) 03/19/2018 1123   CREATININE 0.79 03/19/2018 1123   CALCIUM 9.7 03/19/2018 1123   PROT 7.1 05/01/2018 1027   ALBUMIN 3.6 05/01/2018 1027   AST 19 05/01/2018 1027   ALT 20 05/01/2018 1027     ALKPHOS 72 05/01/2018 1027   BILITOT 1.1 05/01/2018 1027   GFRNONAA >60 10/16/2010 1600   GFRAA >60 10/16/2010 1600   No results found for: CHOL, HDL, LDLCALC, LDLDIRECT, TRIG, CHOLHDL No results found for: HGBA1C No results found for: VITAMINB12 No results found for: TSH   ASSESSMENT AND PLAN 59 y.o. year old female  has a past medical history of Anxiety, Arthritis, Depression, GERD (gastroesophageal reflux disease), Headache, History of kidney stones, IBS (irritable bowel syndrome), OSA (obstructive sleep apnea), Polycythemia vera ([place]), PONV (postoperative nausea and vomiting), Renal calculus, right, Right ureteral stone, and Spinal stenosis. here with     ICD-10-CM   1. OSA on CPAP  G47.33 For home use only DME continuous positive airway pressure (CPAP)   Z99.89 For home use only DME continuous positive airway pressure (CPAP)     Laketra Bowdish is doing fairly well on CPAP therapy.  Compliance report reveals acceptable daily usage with suboptimal 4-hour usage.  She feels that mask refitting may be beneficial to help with comfort.  I will send orders for a mask refitting and supplies today.  She was encouraged to continue using CPAP nightly and for greater than 4 hours each night. Risks of untreated sleep apnea review and education materials provided. Healthy lifestyle habits encouraged. She will follow up in 6 months, sooner if needed. She verbalizes understanding and agreement with this plan.    Orders Placed This Encounter  Procedures  . For home use only DME continuous positive airway pressure (CPAP)    Supplies    Order Specific Question:   Length of Need    Answer:   Lifetime    Order Specific Question:   Patient has OSA or probable OSA    Answer:   Yes    Order Specific Question:   Is the patient currently using CPAP in the home    Answer:   Yes    Order Specific Question:   Settings    Answer:   Other see comments    Order Specific Question:   CPAP supplies needed     Answer:   Mask, headgear, cushions, filters, heated tubing and water chamber  . For home use only DME continuous positive airway pressure (CPAP)    Mask refitting please    Order Specific Question:   Length of Need    Answer:   Lifetime    Order Specific Question:   Patient has OSA or probable OSA    Answer:   Yes    Order Specific Question:   Is the patient currently using CPAP in the home    Answer:   Yes    Order Specific Question:   Settings    Answer:   Other see comments    Order Specific Question:   CPAP supplies needed    Answer:   Mask, headgear, cushions, filters, heated tubing  and water chamber     No orders of the defined types were placed in this encounter.     I spent 15 minutes with the patient. 50% of this time was spent counseling and educating patient on plan of care and medications.    Debbora Presto, FNP-C 12/29/2019, 8:58 AM [facility] [address], [place], [place] [address] [phone]

## 2019-12-29 NOTE — Patient Instructions (Signed)
Please continue using your CPAP regularly. While your insurance requires that you use CPAP at least 4 hours each night on 70% of the nights, I recommend, that you not skip any nights and use it throughout the night if you can. Getting used to CPAP and staying with the treatment long term does take time and patience and discipline. Untreated obstructive sleep apnea when it is moderate to severe can have an adverse impact on cardiovascular health and raise her risk for heart disease, arrhythmias, hypertension, congestive heart failure, stroke and diabetes. Untreated obstructive sleep apnea causes sleep disruption, nonrestorative sleep, and sleep deprivation. This can have an impact on your day to day functioning and cause daytime sleepiness and impairment of cognitive function, memory loss, mood disturbance, and problems focussing. Using CPAP regularly can improve these symptoms.   Follow up in 6 months    Sleep Apnea Sleep apnea affects breathing during sleep. It causes breathing to stop for a short time or to become shallow. It can also increase the risk of:  Heart attack.  Stroke.  Being very overweight (obese).  Diabetes.  Heart failure.  Irregular heartbeat. The goal of treatment is to help you breathe normally again. What are the causes? There are three kinds of sleep apnea:  Obstructive sleep apnea. This is caused by a blocked or collapsed airway.  Central sleep apnea. This happens when the brain does not send the right signals to the muscles that control breathing.  Mixed sleep apnea. This is a combination of obstructive and central sleep apnea. The most common cause of this condition is a collapsed or blocked airway. This can happen if:  Your throat muscles are too relaxed.  Your tongue and tonsils are too large.  You are overweight.  Your airway is too small. What increases the risk?  Being overweight.  Smoking.  Having a small airway.  Being older.  Being  female.  Drinking alcohol.  Taking medicines to calm yourself (sedatives or tranquilizers).  Having family members with the condition. What are the signs or symptoms?  Trouble staying asleep.  Being sleepy or tired during the day.  Getting angry a lot.  Loud snoring.  Headaches in the morning.  Not being able to focus your mind (concentrate).  Forgetting things.  Less interest in sex.  Mood swings.  Personality changes.  Feelings of sadness (depression).  Waking up a lot during the night to pee (urinate).  Dry mouth.  Sore throat. How is this diagnosed?  Your medical history.  A physical exam.  A test that is done when you are sleeping (sleep study). The test is most often done in a sleep lab but may also be done at home. How is this treated?   Sleeping on your side.  Using a medicine to get rid of mucus in your nose (decongestant).  Avoiding the use of alcohol, medicines to help you relax, or certain pain medicines (narcotics).  Losing weight, if needed.  Changing your diet.  Not smoking.  Using a machine to open your airway while you sleep, such as: ? An oral appliance. This is a mouthpiece that shifts your lower jaw forward. ? A CPAP device. This device blows air through a mask when you breathe out (exhale). ? An EPAP device. This has valves that you put in each nostril. ? A BPAP device. This device blows air through a mask when you breathe in (inhale) and breathe out.  Having surgery if other treatments do not work. It   is important to get treatment for sleep apnea. Without treatment, it can lead to:  High blood pressure.  Coronary artery disease.  In men, not being able to have an erection (impotence).  Reduced thinking ability. Follow these instructions at home: Lifestyle  Make changes that your doctor recommends.  Eat a healthy diet.  Lose weight if needed.  Avoid alcohol, medicines to help you relax, and some pain  medicines.  Do not use any products that contain nicotine or tobacco, such as cigarettes, e-cigarettes, and chewing tobacco. If you need help quitting, ask your doctor. General instructions  Take over-the-counter and prescription medicines only as told by your doctor.  If you were given a machine to use while you sleep, use it only as told by your doctor.  If you are having surgery, make sure to tell your doctor you have sleep apnea. You may need to bring your device with you.  Keep all follow-up visits as told by your doctor. This is important. Contact a doctor if:  The machine that you were given to use during sleep bothers you or does not seem to be working.  You do not get better.  You get worse. Get help right away if:  Your chest hurts.  You have trouble breathing in enough air.  You have an uncomfortable feeling in your back, arms, or stomach.  You have trouble talking.  One side of your body feels weak.  A part of your face is hanging down. These symptoms may be an emergency. Do not wait to see if the symptoms will go away. Get medical help right away. Call your local emergency services (911 in the [place] Do not drive yourself to the hospital. Summary  This condition affects breathing during sleep.  The most common cause is a collapsed or blocked airway.  The goal of treatment is to help you breathe normally while you sleep. This information is not intended to replace advice given to you by your health care provider. Make sure you discuss any questions you have with your health care provider. Document Revised: 12/14/2017 Document Reviewed: 10/23/2017 Elsevier Patient Education  2020 [employer].  

## 2020-01-26 ENCOUNTER — Telehealth: Payer: Self-pay

## 2020-01-26 NOTE — Telephone Encounter (Signed)
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

## 2020-01-26 NOTE — Telephone Encounter (Signed)
Tried to call the pt with no answer.  Will send My Chart message to have her call our office to schedule office  appt.

## 2020-02-03 ENCOUNTER — Ambulatory Visit (INDEPENDENT_AMBULATORY_CARE_PROVIDER_SITE_OTHER): Payer: PRIVATE HEALTH INSURANCE | Admitting: Gastroenterology

## 2020-02-03 ENCOUNTER — Encounter: Payer: Self-pay | Admitting: Gastroenterology

## 2020-02-03 VITALS — BP 130/74 | HR 67 | Ht 67.0 in | Wt 254.8 lb

## 2020-02-03 DIAGNOSIS — Z8371 Family history of colonic polyps: Secondary | ICD-10-CM

## 2020-02-03 DIAGNOSIS — D132 Benign neoplasm of duodenum: Secondary | ICD-10-CM | POA: Diagnosis not present

## 2020-02-03 DIAGNOSIS — Z1211 Encounter for screening for malignant neoplasm of colon: Secondary | ICD-10-CM | POA: Diagnosis not present

## 2020-02-03 NOTE — Patient Instructions (Signed)
It has been recommended to you by your physician that you have a(n) Endo/Colon at the hospital completed. Per your request, we did not schedule the procedure(s) today because you would like to wait until Feb 2022. Please contact our office at [phone] and let us know when you are ready to schedule. You will be scheduled for a pre-visit and procedure at that time.  Your provider has requested that you go to the basement level for lab work before procedure in Feb 2022. Press "B" on the elevator. The lab is located at the first door on the left as you exit the elevator.  If you are age 79 or older, your body mass index should be between 23-30. Your Body mass index is 39.91 kg/m. If this is out of the aforementioned range listed, please consider follow up with your Primary Care Provider.  If you are age 71 or younger, your body mass index should be between 19-25. Your Body mass index is 39.91 kg/m. If this is out of the aformentioned range listed, please consider follow up with your Primary Care Provider.   Thank you for choosing me and [facility].  Dr. Rush Landmark

## 2020-02-03 NOTE — Progress Notes (Signed)
[facility] VISIT   Primary Care Provider Deland Pretty, [address] [place] [address] [phone]   Patient Profile: Caitlin Moran is a 59 y.o. 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  The patient presents to the [facility] for an evaluation and management of problem(s) noted below:  Problem List 1. Duodenal adenoma   2. FHx: colonic polyps   3. Colon cancer screening     History of Present Illness Please see initial consultation for full details of HPI.  Interval History The patient returns for follow-up.  There were issues from a insurance perspective in regards to payment from her last procedure (endoscopic ultrasound) and so she was awaiting the potential completion of this before following up though she knew she had a duodenal adenoma that would require some additional work-up.  Thankfully, the patient has been doing well and not had recurrence of the symptoms that led her to GI evaluation years ago and to me which led to her endoscopic ultrasound.  The patient did have findings of a duodenal adenoma that was found between the major and minor ampulla.  She was not resected at the time of her procedure as it was not expected and would not require some additional discussion as well as time for procedure to be completed.  The patient has not had any other changes in her bowel habits.  She has no bleeding.  GI Review of Systems Positive as above Negative for odynophagia, dysphagia, nausea, vomiting, change in bowel habits   Review of Systems General: Denies fevers/chills/weight loss unintentionally Cardiovascular: Denies chest pain/palpitations Pulmonary: Denies shortness of breath Gastroenterological: See  HPI Genitourinary: Denies darkened urine Hematological: Denies easy bruising Dermatological: Denies jaundice Psychological: Mood is stable   Medications Current Outpatient Medications  Medication Sig Dispense Refill  . aspirin EC 81 MG tablet Take 81 mg by mouth daily.    Marland Kitchen CALCIUM PO Take 1,000 mg by mouth daily.    . celecoxib (CELEBREX) 200 MG capsule Take 200 mg by mouth daily.     . Cholecalciferol (VITAMIN D) 50 MCG (2000 UT) CAPS Take 2,000 Units by mouth daily.     . clobetasol cream (TEMOVATE) 5.59 % Apply 1 application topically daily as needed (for psoriasis).     . clonazePAM (KLONOPIN) 0.5 MG tablet Take 0.5 mg by mouth at bedtime as needed for anxiety.    . fish oil-omega-3 fatty acids 1000 MG capsule Take 1 g by mouth daily.     . Golimumab (SIMPONI) 50 MG/0.5ML SOAJ Inject 50 mg into the skin every 30 (thirty) days.     Marland Kitchen HYDROcodone-acetaminophen (NORCO) 7.5-325 MG tablet Take 1 tablet by mouth as needed for moderate pain.   0  . ibuprofen (ADVIL) 800 MG tablet Take 800 mg by mouth every 8 (eight) hours as needed.    . Multiple Vitamins-Minerals (MULTIVITAMIN WOMEN PO) Take 1 tablet by mouth daily.    Marland Kitchen omeprazole (PRILOSEC) 20 MG capsule Take 1 capsule (20 mg total) by mouth 2 (two) times daily before a meal. 60 capsule 2  . Probiotic Product (PROBIOTIC PO) Take 1 capsule by mouth daily.    . rosuvastatin (CRESTOR) 5 MG tablet Take 5 mg by mouth daily.    Marland Kitchen tiZANidine (ZANAFLEX) 2 MG tablet TK 1 T PO TID  PRN    . vortioxetine HBr (TRINTELLIX) 20 MG TABS tablet Take 20 mg by mouth daily.     No current facility-administered medications for this visit.    Allergies No Known Allergies  Histories Past Medical History:  Diagnosis Date  . Anxiety   . Arthritis    neck  . Depression   . GERD (gastroesophageal reflux disease)   . Headache   . History of kidney stones    kidney stone -lithrotrispy 04/30/18- 2 current - stable  . IBS (irritable bowel syndrome)   .  OSA (obstructive sleep apnea)    on CPAP  . Polycythemia vera ([place])   . PONV (postoperative nausea and vomiting)    Didn't go to sleep all the way  during colonoscopy.  . Renal calculus, right   . Right ureteral stone   . Spinal stenosis    Past Surgical History:  Procedure Laterality Date  . ABLATION     unquestionable  . BIOPSY  05/01/2018   Procedure: BIOPSY;  Surgeon: Rush Landmark Telford Nab., MD;  Location: [facility];  Service: Gastroenterology;;  . CESAREAN SECTION    . CHOLECYSTECTOMY    . COLONOSCOPY    . ESOPHAGOGASTRODUODENOSCOPY    . ESOPHAGOGASTRODUODENOSCOPY N/A 05/01/2018   Procedure: ESOPHAGOGASTRODUODENOSCOPY (EGD);  Surgeon: Irving Copas., MD;  Location: [facility];  Service: Gastroenterology;  Laterality: N/A;  . EUS N/A 05/01/2018   Procedure: UPPER ENDOSCOPIC ULTRASOUND (EUS) RADIAL;  Surgeon: Rush Landmark Telford Nab., MD;  Location: [facility];  Service: Gastroenterology;  Laterality: N/A;  . EXTRACORPOREAL SHOCK WAVE LITHOTRIPSY  02/14/2010  . HYSTEROSCOPY WITH D & C N/A 11/27/2012   Procedure: DILATATION AND CURETTAGE /HYSTEROSCOPY;  Surgeon: Marylynn Pearson, MD;  Location: [facility];  Service: Gynecology;  Laterality: N/A;  . WISDOM TOOTH EXTRACTION     Social History   Socioeconomic History  . Marital status: Married    Spouse name: Not on file  . Number of children: 2  . Years of education: Not on file  . Highest education level: Not on file  Occupational History  . Occupation: Therapist, sports  Tobacco Use  . Smoking status: Former Smoker    Quit date: 11/05/1990    Years since quitting: 29.2  . Smokeless tobacco: Never Used  Vaping Use  . Vaping Use: Never used  Substance and Sexual Activity  . Alcohol use: Yes    Alcohol/week: 7.0 standard drinks    Types: 7 Glasses of wine per week    Comment: 5  . Drug use: No  . Sexual activity: Not on file  Other Topics Concern  . Not on file  Social History Narrative  . Not on file   Social  Determinants of Health   Financial Resource Strain:   . Difficulty of Paying Living Expenses: Not on file  Food Insecurity:   . Worried About Charity fundraiser in the Last Year: Not on file  . Ran Out of Food in the Last Year: Not on file  Transportation Needs:   . Lack of Transportation (Medical): Not on file  . Lack of Transportation (Non-Medical): Not on file  Physical Activity:   . Days of Exercise per Week: Not on file  . Minutes of Exercise per Session: Not on file  Stress:   . Feeling of Stress : Not on file  Social Connections:   . Frequency of Communication with Friends and Family: Not on file  . Frequency of Social Gatherings with Friends and Family: Not on  file  . Attends Religious Services: Not on file  . Active Member of Clubs or Organizations: Not on file  . Attends Archivist Meetings: Not on file  . Marital Status: Not on file  Intimate Partner Violence:   . Fear of Current or Ex-Partner: Not on file  . Emotionally Abused: Not on file  . Physically Abused: Not on file  . Sexually Abused: Not on file   Family History  Problem Relation Age of Onset  . Breast cancer Maternal Aunt   . Esophageal cancer Mother   . Lung cancer Father   . CAD Father   . Colon cancer Neg Hx   . Inflammatory bowel disease Neg Hx   . Liver disease Neg Hx   . Pancreatic cancer Neg Hx   . Stomach cancer Neg Hx   . Rectal cancer Neg Hx    I have reviewed her medical, social, and family history in detail and updated the electronic medical record as necessary.    PHYSICAL EXAMINATION  BP 130/74   Pulse 67   Ht 5\' 7"  (1.702 m)   Wt 254 lb 12.8 oz (115.6 kg)   LMP 09/24/2012   SpO2 96%   BMI 39.91 kg/m  Wt Readings from Last 3 Encounters:  02/03/20 254 lb 12.8 oz (115.6 kg)  12/29/19 253 lb 12.8 oz (115.1 kg)  06/26/19 253 lb 3.2 oz (114.9 kg)  GEN: NAD, appears stated age, doesn't appear chronically ill PSYCH: Cooperative, without pressured speech EYE:  Conjunctivae pink, sclerae anicteric ENT: MMM CV: Nontachycardic RESP: No audible wheezing GI: Soft, nontender, rounded, nondistended, no rebound MSK/EXT: No LE edema SKIN: No jaundice NEURO:  Alert & Oriented x 3, no focal deficits   REVIEW OF DATA  I reviewed the following data at the time of this encounter:  GI Procedures and Studies  2020 EGD/EUS EGD Impression: - No gross lesions in proximal/middle esophagus. - Esophageal mucosal changes suggestive of short-segment Barrett's esophagus. Biopsied to rule in/out. - Multiple gastric polyps. Biopsied - though appearance typical of fundic gland polyps. - A single gastric polyp. Resected and retrieved via mucosal resection. Clip (MR conditional) was placed to close defect. - White nummular lesions in posterior wall of the body of the gastric mucosa. Biopsied. - No other gross lesions in the stomach. Biopsied for HP. - Normal mucosa was found in the duodenal bulb, in the first portion of the duodenum and in the second portion of the duodenum. - Normal ampulla. - A single duodenal polypoid lesion between the major and minor papilla was found. Biopsied to rule in/out adenoma. EUS Impression: - There was no sign of significant pathology in the pancreatic head, genu of the pancreas, pancreatic body and pancreatic tail. - There was no sign of significant pathology in the bile duct. No mass/stone/sludge noted. - There was no sign of significant pathology at the ampulla. - A cyst was found in the visualized portion of the liver. - No malignant-appearing lymph nodes were visualized in the celiac region (level 20), peripancreatic region and porta hepatis region. Pathology Diagnosis 1. Duodenum, Biopsy - DUODENAL ADENOMA. - NEGATIVE FOR HIGH GRADE DYSPLASIA. 2. Esophagus, biopsy - SQUAMOUS ESOPHAGEAL EPITHELIUM WITH NO SIGNIFICANT PATHOLOGIC FINDINGS. - NEGATIVE FOR INCREASED INTRAEPITHELIAL EOSINOPHILS. 3. Stomach, biopsy, Posterior wall  of body - GASTRIC ANTRAL MUCOSA WITH MILD REACTIVE GASTROPATHY. - GASTRIC OXYNTIC MUCOSA WITH MILD CHRONIC GASTRITIS. 4. Stomach, biopsy, Random Gastric - GASTRIC ANTRAL MUCOSA WITH MILD REACTIVE GASTROPATHY. - GASTRIC OXYNTIC MUCOSA  WITH MILD CHRONIC GASTRITIS. - NEGATIVE FOR HELICOBACTER PYLORI. 5. Stomach, polyp(s), Gastric - FUNDIC GLAND POLYP. 6. Stomach, polyp(s), Gastric - FUNDIC GLAND POLYP.  2012 colonoscopy (indication hematochezia and family history of colon polyps in sister) Perianal exam was abnormal showing nonthrombosed external hemorrhoids Normal colon without polyps, mass, vascular ectasias, inflammatory changes, diverticula presents.  Retroflexed view of the distal rectum and anal verge was normal and showed no anal or rectal abnormalities.  The distal 5 cm of terminal ileum appeared normal  Laboratory Studies  Reviewed those in epic  Imaging Studies  No new studies to review   ASSESSMENT  Ms. Ehle is a 59 y.o. 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  The patient is seen today for evaluation and management of:  1. Duodenal adenoma   2. FHx: colonic polyps   3. Colon cancer screening    The patient is clinically and hemodynamically stable.  She has a duodenal adenoma that will require resection.  It has been over 1.5 years since we last evaluated this so my hope is that this is not extended or grown significantly and hopefully remains endoscopically amenable to resection.  The patient understands that polyps can grow and certainly there is a risk that if it is encroached or begins to develop or progress to the ampulla or minor ampulla it could require additional or more significant resection techniques.  The patient is now ready to move forward with this being scheduled and we will plan  to proceed with this in the next few months based on her availability.  Patient will be due for colon cancer screening in 2022 (although she could have been due for a 5-year follow-up in the setting of her previous family history of first-degree of colon polyps in 2017).  We will plan to perform a colonoscopy at the same time as her upper endoscopy.  The risks and benefits of endoscopic evaluation were discussed with the patient; these include but are not limited to the risk of perforation, infection, bleeding, missed lesions, lack of diagnosis, severe illness requiring hospitalization, as well as anesthesia and sedation related illnesses.  The patient is agreeable to proceed.  All patient questions were answered to the best of my ability, and the patient agrees to the aforementioned plan of action with follow-up as indicated.   PLAN  Preprocedure labs as outlined below EGD to attempt duodenal adenoma resection Colonoscopy for screening purposes Patient to return for labs (amylase/lipase/HFP as future orders) if she has recurrent abdominal pain within 6-10 hours of onset if she is stable   Orders Placed This Encounter  Procedures  . CBC  . Basic Metabolic Panel (BMET)  . INR/PT    New Prescriptions   No medications on file   Modified Medications   No medications on file    Planned Follow Up: No follow-ups on file.   Total Time in Face-to-Face and in Coordination of Care for patient including independent/personal interpretation/review of prior testing, medical history, examination, medication adjustment, communicating results with the patient directly, and documentation with the EHR is 25 minutes.   Justice Britain, MD [facility] Office # [phone]

## 2020-03-19 ENCOUNTER — Telehealth: Payer: Self-pay | Admitting: Gastroenterology

## 2020-03-19 NOTE — Telephone Encounter (Signed)
The pt was seen in the office please send to [place] thank you

## 2020-03-19 NOTE — Telephone Encounter (Signed)
Pt is requesting to schedule her EGD and Colonoscopy at the hospital.

## 2020-03-22 NOTE — Telephone Encounter (Signed)
Pt called to follow up on message below. Pls call her.

## 2020-03-23 ENCOUNTER — Other Ambulatory Visit: Payer: Self-pay

## 2020-03-23 DIAGNOSIS — Z1211 Encounter for screening for malignant neoplasm of colon: Secondary | ICD-10-CM

## 2020-03-23 DIAGNOSIS — D132 Benign neoplasm of duodenum: Secondary | ICD-10-CM

## 2020-03-23 NOTE — Telephone Encounter (Signed)
I spoke with the pt and she has been scheduled for endo colon on 2/21 at [facility] with Dr Rush Landmark COVID test on 2/17 at 10 am  The pt has been instructed and made aware that information will be mailed to the home and sent to My Chart.

## 2020-03-23 NOTE — Telephone Encounter (Signed)
Called and left message for patient that we rec'd her message re: procedure at the hospital. We will add patient to Dr. Donneta Romberg wait list and his nurse will reach out to her to get her scheduled once a slot becomes available.

## 2020-04-01 ENCOUNTER — Telehealth: Payer: Self-pay | Admitting: Gastroenterology

## 2020-04-01 NOTE — Telephone Encounter (Signed)
The pt has been rescheduled to 05/24/20 at [facility] at 730 am.  COVID test on 3/10 at 10 am. The pt has been instructed and advised to call with any questions.

## 2020-04-16 ENCOUNTER — Ambulatory Visit: Payer: PRIVATE HEALTH INSURANCE | Admitting: Podiatry

## 2020-04-20 ENCOUNTER — Other Ambulatory Visit: Payer: Self-pay | Admitting: Podiatrist

## 2020-04-20 ENCOUNTER — Other Ambulatory Visit: Payer: Self-pay

## 2020-04-20 ENCOUNTER — Ambulatory Visit (INDEPENDENT_AMBULATORY_CARE_PROVIDER_SITE_OTHER): Payer: PRIVATE HEALTH INSURANCE | Admitting: Podiatrist

## 2020-04-20 ENCOUNTER — Encounter: Payer: Self-pay | Admitting: Podiatrist

## 2020-04-20 DIAGNOSIS — M7661 Achilles tendinitis, right leg: Secondary | ICD-10-CM | POA: Diagnosis not present

## 2020-04-20 DIAGNOSIS — M722 Plantar fascial fibromatosis: Secondary | ICD-10-CM

## 2020-04-20 NOTE — Patient Instructions (Addendum)
I have ordered a medication for you that will come from [facility] in [place]. They should be calling you to verify insurance and will mail the medication to you. If you live close by then you can go by their pharmacy to pick up the medication. Their phone number is [phone]. If you do not hear from them in the next few days, please give [place] a call at [phone].   Achilles Tendinitis  with Rehab Achilles tendinitis is a disorder of the Achilles tendon. The Achilles tendon connects the large calf muscles (Gastrocnemius and Soleus) to the heel bone (calcaneus). This tendon is sometimes called the heel cord. It is important for pushing-off and standing on your toes and is important for walking, running, or jumping. Tendinitis is often caused by overuse and repetitive microtrauma. SYMPTOMS  Pain, tenderness, swelling, warmth, and redness may occur over the Achilles tendon even at rest.  Pain with pushing off, or flexing or extending the ankle.  Pain that is worsened after or during activity. CAUSES   Overuse sometimes seen with rapid increase in exercise programs or in sports requiring running and jumping.  Poor physical conditioning (strength and flexibility or endurance).  Running sports, especially training running down hills.  Inadequate warm-up before practice or play or failure to stretch before participation.  Injury to the tendon. PREVENTION   Warm up and stretch before practice or competition.  Allow time for adequate rest and recovery between practices and competition.  Keep up conditioning.  Keep up ankle and leg flexibility.  Improve or keep muscle strength and endurance.  Improve cardiovascular fitness.  Use proper technique.  Use proper equipment (shoes, skates).  To help prevent recurrence, taping, protective strapping, or an adhesive bandage may be recommended for several weeks after healing is complete. PROGNOSIS   Recovery may take weeks to  several months to heal.  Longer recovery is expected if symptoms have been prolonged.  Recovery is usually quicker if the inflammation is due to a direct blow as compared with overuse or sudden strain. RELATED COMPLICATIONS   Healing time will be prolonged if the condition is not correctly treated. The injury must be given plenty of time to heal.  Symptoms can reoccur if activity is resumed too soon.  Untreated, tendinitis may increase the risk of tendon rupture requiring additional time for recovery and possibly surgery. TREATMENT   The first treatment consists of rest anti-inflammatory medication, and ice to relieve the pain.  Stretching and strengthening exercises after resolution of pain will likely help reduce the risk of recurrence. Referral to a physical therapist or athletic trainer for further evaluation and treatment may be helpful.  A walking boot or cast may be recommended to rest the Achilles tendon. This can help break the cycle of inflammation and microtrauma.  Arch supports (orthotics) may be prescribed or recommended by your caregiver as an adjunct to therapy and rest.  Surgery to remove the inflamed tendon lining or degenerated tendon tissue is rarely necessary and has shown less than predictable results. MEDICATION   Nonsteroidal anti-inflammatory medications, such as aspirin and ibuprofen, may be used for pain and inflammation relief. Do not take within 7 days before surgery. Take these as directed by your caregiver. Contact your caregiver immediately if any bleeding, stomach upset, or signs of allergic reaction occur. Other minor pain relievers, such as acetaminophen, may also be used.  Pain relievers may be prescribed as necessary by your caregiver. Do not take prescription pain medication for longer than  4 to 7 days. Use only as directed and only as much as you need.  Cortisone injections are rarely indicated. Cortisone injections may weaken tendons and predispose  to rupture. It is better to give the condition more time to heal than to use them. HEAT AND COLD  Cold is used to relieve pain and reduce inflammation for acute and chronic Achilles tendinitis. Cold should be applied for 10 to 15 minutes every 2 to 3 hours for inflammation and pain and immediately after any activity that aggravates your symptoms. Use ice packs or an ice massage.  Heat may be used before performing stretching and strengthening activities prescribed by your caregiver. Use a heat pack or a warm soak. SEEK MEDICAL CARE IF:  Symptoms get worse or do not improve in 2 weeks despite treatment.  New, unexplained symptoms develop. Drugs used in treatment may produce side effects.  EXERCISES:  RANGE OF MOTION (ROM) AND STRETCHING EXERCISES - Achilles Tendinitis  These exercises may help you when beginning to rehabilitate your injury. Your symptoms may resolve with or without further involvement from your physician, physical therapist or athletic trainer. While completing these exercises, remember:   Restoring tissue flexibility helps normal motion to return to the joints. This allows healthier, less painful movement and activity.  An effective stretch should be held for at least 30 seconds.  A stretch should never be painful. You should only feel a gentle lengthening or release in the stretched tissue.  STRETCH  Gastroc, Standing   Place hands on wall.  Extend right / left leg, keeping the front knee somewhat bent.  Slightly point your toes inward on your back foot.  Keeping your right / left heel on the floor and your knee straight, shift your weight toward the wall, not allowing your back to arch.  You should feel a gentle stretch in the right / left calf. Hold this position for 10 seconds. Repeat 3 times. Complete this stretch 2 times per day.  STRETCH  Soleus, Standing   Place hands on wall.  Extend right / left leg, keeping the other knee somewhat bent.  Slightly  point your toes inward on your back foot.  Keep your right / left heel on the floor, bend your back knee, and slightly shift your weight over the back leg so that you feel a gentle stretch deep in your back calf.  Hold this position for 10 seconds. Repeat 3 times. Complete this stretch 2 times per day.  STRETCH  Gastrocsoleus, Standing  Note: This exercise can place a lot of stress on your foot and ankle. Please complete this exercise only if specifically instructed by your caregiver.   Place the ball of your right / left foot on a step, keeping your other foot firmly on the same step.  Hold on to the wall or a rail for balance.  Slowly lift your other foot, allowing your body weight to press your heel down over the edge of the step.  You should feel a stretch in your right / left calf.  Hold this position for 10 seconds.  Repeat this exercise with a slight bend in your knee. Repeat 3 times. Complete this stretch 2 times per day.   STRENGTHENING EXERCISES - Achilles Tendinitis These exercises may help you when beginning to rehabilitate your injury. They may resolve your symptoms with or without further involvement from your physician, physical therapist or athletic trainer. While completing these exercises, remember:   Muscles can gain both the  endurance and the strength needed for everyday activities through controlled exercises.  Complete these exercises as instructed by your physician, physical therapist or athletic trainer. Progress the resistance and repetitions only as guided.  You may experience muscle soreness or fatigue, but the pain or discomfort you are trying to eliminate should never worsen during these exercises. If this pain does worsen, stop and make certain you are following the directions exactly. If the pain is still present after adjustments, discontinue the exercise until you can discuss the trouble with your clinician.  STRENGTH - Plantar-flexors   Sit with  your right / left leg extended. Holding onto both ends of a rubber exercise band/tubing, loop it around the ball of your foot. Keep a slight tension in the band.  Slowly push your toes away from you, pointing them downward.  Hold this position for 10 seconds. Return slowly, controlling the tension in the band/tubing. Repeat 3 times. Complete this exercise 2 times per day.   STRENGTH - Plantar-flexors   Stand with your feet shoulder width apart. Steady yourself with a wall or table using as little support as needed.  Keeping your weight evenly spread over the width of your feet, rise up on your toes.*  Hold this position for 10 seconds. Repeat 3 times. Complete this exercise 2 times per day.  *If this is too easy, shift your weight toward your right / left leg until you feel challenged. Ultimately, you may be asked to do this exercise with your right / left foot only.  STRENGTH  Plantar-flexors, Eccentric  Note: This exercise can place a lot of stress on your foot and ankle. Please complete this exercise only if specifically instructed by your caregiver.   Place the balls of your feet on a step. With your hands, use only enough support from a wall or rail to keep your balance.  Keep your knees straight and rise up on your toes.  Slowly shift your weight entirely to your right / left toes and pick up your opposite foot. Gently and with controlled movement, lower your weight through your right / left foot so that your heel drops below the level of the step. You will feel a slight stretch in the back of your calf at the end position.  Use the healthy leg to help rise up onto the balls of both feet, then lower weight only on the right / left leg again. Build up to 15 repetitions. Then progress to 3 consecutive sets of 15 repetitions.*  After completing the above exercise, complete the same exercise with a slight knee bend (about 30 degrees). Again, build up to 15 repetitions. Then progress to  3 consecutive sets of 15 repetitions.* Perform this exercise 2 times per day.  *When you easily complete 3 sets of 15, your physician, physical therapist or athletic trainer may advise you to add resistance by wearing a backpack filled with additional weight.  STRENGTH - Plantar Flexors, Seated   Sit on a chair that allows your feet to rest flat on the ground. If necessary, sit at the edge of the chair.  Keeping your toes firmly on the ground, lift your right / left heel as far as you can without increasing any discomfort in your ankle. Repeat 3 times. Complete this exercise 2 times a day.

## 2020-04-26 NOTE — Progress Notes (Signed)
Chief Complaint  Patient presents with  . Pain    Rt back heel pain x 1 mo; 6/10 at it's worse but on average 3/10 dull pain -no injury/swelling per pt -worse towards end of day Tx: stretching   . Callouses    BL callus care     HPI: Patient is 60 y.o. female who presents today for the concerns as listed above.  She relates she has had pain on the posterior medial aspect of the heel for about a month.  She has tried stretching with no improvement.  She also has small calluses on the forefoot bilateral she would like looked at today.   Patient Active Problem List   Diagnosis Date Noted  . DDD (degenerative disc disease), cervical 07/25/2018  . Cervical radiculopathy 06/19/2018  . High hematocrit without dehydration 05/09/2018  . Snoring 05/09/2018  . Sleep apnea in adult 05/09/2018  . Excessive daytime sleepiness 05/09/2018  . Elevated hemoglobin ([place]) 05/09/2018  . History of colonic polyps 03/21/2018  . Hemorrhoids 03/21/2018  . Rectal bleeding 03/21/2018  . RUQ pain 03/21/2018  . GERD (gastroesophageal reflux disease) 11/06/2017  . Hyperlipidemia 11/06/2017  . Varicose veins of leg with pain, bilateral 11/06/2017  . Left shoulder pain 01/28/2013  . Proximal humerus fracture 01/28/2013    Current Outpatient Medications on File Prior to Visit  Medication Sig Dispense Refill  . aspirin EC 81 MG tablet Take 81 mg by mouth daily.    Marland Kitchen CALCIUM PO Take 1,000 mg by mouth daily.    . celecoxib (CELEBREX) 200 MG capsule Take 200 mg by mouth daily.     . Cholecalciferol (VITAMIN D) 50 MCG (2000 UT) CAPS Take 2,000 Units by mouth daily.     . clobetasol cream (TEMOVATE) 3.41 % Apply 1 application topically daily as needed (for psoriasis).     . clonazePAM (KLONOPIN) 0.5 MG tablet Take 0.5 mg by mouth at bedtime as needed for anxiety.    . fish oil-omega-3 fatty acids 1000 MG capsule Take 1 g by mouth daily.     . Golimumab (SIMPONI) 50 MG/0.5ML SOAJ Inject 50 mg into the skin every 30  (thirty) days.     Marland Kitchen HYDROcodone-acetaminophen (NORCO) 7.5-325 MG tablet Take 1 tablet by mouth as needed for moderate pain.   0  . ibuprofen (ADVIL) 800 MG tablet Take 800 mg by mouth every 8 (eight) hours as needed.    . Multiple Vitamins-Minerals (MULTIVITAMIN WOMEN PO) Take 1 tablet by mouth daily.    Marland Kitchen omeprazole (PRILOSEC) 20 MG capsule Take 1 capsule (20 mg total) by mouth 2 (two) times daily before a meal. 60 capsule 2  . Probiotic Product (PROBIOTIC PO) Take 1 capsule by mouth daily.    . rosuvastatin (CRESTOR) 5 MG tablet Take 5 mg by mouth daily.    Marland Kitchen tiZANidine (ZANAFLEX) 2 MG tablet TK 1 T PO TID PRN    . vortioxetine HBr (TRINTELLIX) 20 MG TABS tablet Take 20 mg by mouth daily.     No current facility-administered medications on file prior to visit.    No Known Allergies  Review of Systems No fevers, chills, nausea, muscle aches, no difficulty breathing, no calf pain, no chest pain or shortness of breath.   Physical Exam  GENERAL APPEARANCE: Alert, conversant. Appropriately groomed. No acute distress.   VASCULAR: Pedal pulses palpable DP and PT bilateral.  Capillary refill time is immediate to all digits,  Proximal to distal cooling it warm to warm.  Digital perfusion adequate.   NEUROLOGIC: sensation is intact to 5.07 monofilament at 5/5 sites bilateral.  Light touch is intact bilateral, vibratory sensation intact bilateral  MUSCULOSKELETAL: acceptable muscle strength, tone and stability bilateral.  No gross boney pedal deformities noted. Decreased rom at the ankle joint is noted.  Pain on palpation at the medial insertion site of the achilles tendon on the posterior medial heel noted.  Some swelling is present in this area as well.  DERMATOLOGIC: skin is warm, supple, and dry.  No open lesions noted.  No rash, no pre ulcerative lesions. Digital nails are asymptomatic.  Slight calluses present submet 3 bilateral noted.  Xray evaluation 3 views right   Normal osseous  mineralization.  No fracture or dislocation or acute osseous abnormalities present.  Joint spaces are normal.    Assessment     ICD-10-CM   1. Tendonitis, Achilles, right  M76.61      Plan  Discussed exam and xray findings with the patient.  Recommended felt heel lifts for her shoes and these were dispensed for her today  Recommended stretching and icing/ instructions were dispensed for her.  Recommended a topical achilles tendonitis cream-  rx was written and faxed into [facility] for her use.  Discussed in the future she may be a candidate for a boot and/or physical therapy, etc.  She will try conservative treatments as we discussed and will return in 4 weeks for follow up and further treatment recommendations as needed at that time.

## 2020-04-29 ENCOUNTER — Other Ambulatory Visit (HOSPITAL_COMMUNITY): Payer: PRIVATE HEALTH INSURANCE

## 2020-05-03 ENCOUNTER — Telehealth: Payer: Self-pay | Admitting: Gastroenterology

## 2020-05-03 NOTE — Telephone Encounter (Signed)
Left message on machine to call back  

## 2020-05-03 NOTE — Telephone Encounter (Signed)
Pt is returning a missed call from the nurse. 

## 2020-05-03 NOTE — Telephone Encounter (Signed)
The pt called to try and have her colon endo at the hospital moved up sooner.  I looked at the schedule and no sooner appts available.  She states she is having some upper and lower abd discomfort "like when I had my gallbladder removed".  She states she will keep the appt as planned and call back to check on cancellations. I have advised her that if she has severe pain she should go to urgent care or ED for eval.  The pt has been advised of the information and verbalized understanding.

## 2020-05-03 NOTE — Telephone Encounter (Signed)
Patient is returning your call.  

## 2020-05-03 NOTE — Telephone Encounter (Signed)
Pt is looking for a sooner procedure appt than 05/24/20 at [facility]. Pt stated that her gi sxs have worsened. She stated that her sxs feels similar to when she had gallstones. However, she no longer has a gallbladder. Pls call her.

## 2020-05-05 NOTE — Telephone Encounter (Signed)
Patient called back stating she went to see her PCP and had some tests done and wanted to discuss that with you.

## 2020-05-06 NOTE — Telephone Encounter (Signed)
Patient returned your call, please call patient one more time.   

## 2020-05-06 NOTE — Telephone Encounter (Signed)
Left message on machine to call back  

## 2020-05-10 NOTE — Telephone Encounter (Signed)
The pt called to inform our office that she had labs completed at her PCP.  She is going to have those labs faxed to our attention for review.  Fax number was provided.

## 2020-05-12 ENCOUNTER — Telehealth: Payer: Self-pay | Admitting: Gastroenterology

## 2020-05-12 NOTE — Telephone Encounter (Signed)
Mansouraty, Telford Nab., MD  Timothy Lasso, RN Aris Moman,  Please obtain the imaging and labs performed by PCP that patient says she has had completed.  I would like for her to return this week and get a CMP and Lipase/Amylase in addition to the CBC and INR that are needed pre-procedure for her upcoming procedure.   I previously performed an EUS, and we did not see any evidence of a stone in bile duct. I can add that on to the procedures that are scheduled and it will not take any more time to be added. Thanks.   GM

## 2020-05-12 NOTE — Telephone Encounter (Signed)
I spoke with the pt and advised her labs and imaging have not been received.  She will call PCP now and have them fax those over for review.  She will wait and see if Dr Rush Landmark still needs her to have additional labs after reviewing what she already had at the PCP office.

## 2020-05-12 NOTE — Telephone Encounter (Signed)
Patty, Dr Rush Landmark said he is going to send you a message regarding the patient.

## 2020-05-12 NOTE — Telephone Encounter (Signed)
Ro have you seen any labs or [place] results for this pt?

## 2020-05-12 NOTE — Telephone Encounter (Signed)
Inbound call from patient wanting to make sure we received her lab and ultrasound results from Dr. Pennie Banter office.  Also requests a call back from a nurse to discuss results.  Please advise.

## 2020-05-14 NOTE — Telephone Encounter (Signed)
Inbound call from patient wanting to make sure lab and imaging reports were received.  Please advise.

## 2020-05-14 NOTE — Telephone Encounter (Signed)
Noted  

## 2020-05-14 NOTE — Telephone Encounter (Signed)
Ravonda do you know if the labs and scans for this pt have come in? See notes below.

## 2020-05-14 NOTE — Telephone Encounter (Signed)
The labs and scan came in and were given to Dr. Rush Landmark.

## 2020-05-14 NOTE — Telephone Encounter (Signed)
I have reviewed the patient's labs and her imaging  2/24 ultrasound Mild hepatic steatosis Postcholecystectomy with mildly dilated common duct measuring 9 mm If there is concern for choledocholithiasis MRI/MRCP is recommended 9 mm nonobstructing right renal calculus  04/15/2020 labs WBC 6.9 Hemoglobin/hematocrit 15.9/47.6 Platelet 256 MCV 90.3 Sodium 142 Potassium 4.3 BUN/creatinine 17/0.87 Albumin 4.3 Total protein 6.7 AST/ALT 26/22 Alkaline phosphatase 86 Total bili 0.6  02/11/2020 labs Hepatitis A antibody negative Hepatitis B surface antigen negative Hepatitis B core IgM antibody negative Hepatitis B surface antibody reactive hepatitis C antibody 0.2 disease negative) Iron/TIBC 109/386 Iron saturation 28% You IBC 277   Although very unlikely as a previous EUS was performed 2 years ago, we certainly can consider an endoscopic ultrasound once again.  In the interim however, we can move forward with an MRI/MRCP as per radiology recommendations.  This will be done with contrast.  Diagnosis dilated bile duct, history of cholecystectomy, rule out choledocholithiasis.  She can also come in and get repeat labs to see if there is any change because they were normal most recently those labs can include a lipase/hepatic function panel.  For now continue to keep the EGD/colonoscopy.  We will see if we can try to add on an endoscopic ultrasound to the possible procedure on 3/14.  If MRI/MRCP can be performed before then then we can get a better sense of things as well.  If a stone is found we will have to find another date for an ERCP as we would not be able to accommodate that on 3/14 but an endoscopic ultrasound could be performed on that day if necessary.  Please let the patient know this is the plan of action.  Thanks. GM

## 2020-05-15 NOTE — Telephone Encounter (Signed)
Please look at reply I sent to [place] or Covering on the 3/1 message from Ronald. I replied with my plan of action and forwarded. Thank you. GM

## 2020-05-17 ENCOUNTER — Other Ambulatory Visit (INDEPENDENT_AMBULATORY_CARE_PROVIDER_SITE_OTHER): Payer: PRIVATE HEALTH INSURANCE

## 2020-05-17 ENCOUNTER — Other Ambulatory Visit: Payer: Self-pay

## 2020-05-17 DIAGNOSIS — R1011 Right upper quadrant pain: Secondary | ICD-10-CM

## 2020-05-17 LAB — CBC WITH DIFFERENTIAL/PLATELET
Basophils Absolute: 0.1 10*3/uL (ref 0.0–0.1)
Basophils Relative: 1.1 % (ref 0.0–3.0)
Eosinophils Absolute: 0.3 10*3/uL (ref 0.0–0.7)
Eosinophils Relative: 3.9 % (ref 0.0–5.0)
HCT: 45.1 % (ref 36.0–46.0)
Hemoglobin: 15.4 g/dL — ABNORMAL HIGH (ref 12.0–15.0)
Lymphocytes Relative: 29.2 % (ref 12.0–46.0)
Lymphs Abs: 2.2 10*3/uL (ref 0.7–4.0)
MCHC: 34.2 g/dL (ref 30.0–36.0)
MCV: 89.2 fl (ref 78.0–100.0)
Monocytes Absolute: 0.5 10*3/uL (ref 0.1–1.0)
Monocytes Relative: 7 % (ref 3.0–12.0)
Neutro Abs: 4.3 10*3/uL (ref 1.4–7.7)
Neutrophils Relative %: 58.8 % (ref 43.0–77.0)
Platelets: 252 10*3/uL (ref 150.0–400.0)
RBC: 5.05 Mil/uL (ref 3.87–5.11)
RDW: 13.4 % (ref 11.5–15.5)
WBC: 7.4 10*3/uL (ref 4.0–10.5)

## 2020-05-17 LAB — COMPREHENSIVE METABOLIC PANEL
ALT: 18 U/L (ref 0–35)
AST: 15 U/L (ref 0–37)
Albumin: 4 g/dL (ref 3.5–5.2)
Alkaline Phosphatase: 80 U/L (ref 39–117)
BUN: 21 mg/dL (ref 6–23)
CO2: 31 mEq/L (ref 19–32)
Calcium: 9.7 mg/dL (ref 8.4–10.5)
Chloride: 104 mEq/L (ref 96–112)
Creatinine, Ser: 1.01 mg/dL (ref 0.40–1.20)
GFR: 61.45 mL/min (ref >60.00)
Glucose, Bld: 86 mg/dL (ref 70–99)
Potassium: 4.2 mEq/L (ref 3.5–5.1)
Sodium: 141 mEq/L (ref 135–145)
Total Bilirubin: 0.8 mg/dL (ref 0.2–1.2)
Total Protein: 6.8 g/dL (ref 6.0–8.3)

## 2020-05-17 LAB — LIPASE: Lipase: 67 U/L — ABNORMAL HIGH (ref 11.0–59.0)

## 2020-05-17 LAB — PROTIME-INR
INR: 1.1 ratio — ABNORMAL HIGH (ref 0.8–1.0)
Prothrombin Time: 11.9 s (ref 9.6–13.1)

## 2020-05-17 LAB — AMYLASE: Amylase: 30 U/L (ref 27–131)

## 2020-05-17 NOTE — Telephone Encounter (Signed)
Order has been entered for MRI MRCP  Schedulers will call the pt with appt if insurance covers.  Labs have been entered.  The pt will be given this information via My Chart. Pt preference.

## 2020-05-17 NOTE — Progress Notes (Signed)
Attempted to obtain medical history via telephone, unable to reach at this time. I left a voicemail to return pre surgical testing department's phone call.  

## 2020-05-17 NOTE — Telephone Encounter (Signed)
Dear Ms. Caitlin Moran, I have reviewed the labs and the ultrasound. Your liver tests are normal. With that being said, we will likely proceed with an MRI/MRCP if insurance gives [place] the okay. With having performed the previous endoscopic ultrasound a few years ago, will be very unlikely that you have a new stone in that area but we can certainly appreciate the radiology recommendations and see if we find anything there. He will continue to keep your EGD/colonoscopy for now. Laboratories are recommended and we will ask you to come in to get those done in the next few days. My team will be reaching out to you that you should hear from [place] today. Good luck and good health.   Justice Britain, MD

## 2020-05-18 ENCOUNTER — Other Ambulatory Visit: Payer: Self-pay

## 2020-05-18 ENCOUNTER — Ambulatory Visit (INDEPENDENT_AMBULATORY_CARE_PROVIDER_SITE_OTHER): Payer: PRIVATE HEALTH INSURANCE | Admitting: Podiatry

## 2020-05-18 DIAGNOSIS — M7731 Calcaneal spur, right foot: Secondary | ICD-10-CM

## 2020-05-18 DIAGNOSIS — M7661 Achilles tendinitis, right leg: Secondary | ICD-10-CM | POA: Diagnosis not present

## 2020-05-18 DIAGNOSIS — M9261 Juvenile osteochondrosis of tarsus, right ankle: Secondary | ICD-10-CM

## 2020-05-18 DIAGNOSIS — M216X9 Other acquired deformities of unspecified foot: Secondary | ICD-10-CM | POA: Diagnosis not present

## 2020-05-18 NOTE — Progress Notes (Signed)
  Subjective:  Patient ID: Caitlin Moran, female    DOB: 09/09/1960,  MRN: [id number]  Chief Complaint  Patient presents with  . Tendonitis    Follow up - achilles - right foot - feeling the same since the last visit - pain varies - no fever , chills , headaches    60 y.o. female presents with the above complaint. History confirmed with patient.   Objective:  Physical Exam: warm, good capillary refill, no trophic changes or ulcerative lesions, normal DP and PT pulses and normal sensory exam. Right Foot: POP posterior heel with prominent haglund deformity, no pain at watershed region, decreased ankle joint ROM   Assessment:   1. Tendonitis, Achilles, right   2. Equinus deformity of foot   3. Calcaneal spur of right foot   4. Haglund's deformity of right heel      Plan:  Patient was evaluated and treated and all questions answered.  Achilles Tendonitis -Educated on stretching and icing of the affected limb. -Night splint dispensed.  -Consider voltaren gel however she is on celebrex -Replaced heel pads. Discussed we will only use them for a few more weeks as this can end up causing equinus with prolonged use.  No follow-ups on file.

## 2020-05-20 ENCOUNTER — Other Ambulatory Visit (HOSPITAL_COMMUNITY)
Admission: RE | Admit: 2020-05-20 | Discharge: 2020-05-20 | Disposition: A | Discharge status: Home / Self Care | Payer: PRIVATE HEALTH INSURANCE | Source: Ambulatory Visit | Attending: Gastroenterology | Admitting: Gastroenterology

## 2020-05-20 DIAGNOSIS — Z01812 Encounter for preprocedural laboratory examination: Secondary | ICD-10-CM | POA: Diagnosis present

## 2020-05-20 DIAGNOSIS — Z20822 Contact with and (suspected) exposure to covid-19: Secondary | ICD-10-CM | POA: Diagnosis not present

## 2020-05-20 LAB — SARS CORONAVIRUS 2 (TAT 6-24 HRS): SARS Coronavirus 2: NEGATIVE

## 2020-05-23 NOTE — Anesthesia Preprocedure Evaluation (Addendum)
Anesthesia Evaluation  Patient identified by MRN, date of birth, ID band Patient awake    Reviewed: Allergy & Precautions, H&P , NPO status , Patient's Chart, lab work & pertinent test results  History of Anesthesia Complications (+) PONV  Airway Mallampati: III  TM Distance: >3 FB Neck ROM: Full    Dental no notable dental hx. (+) Teeth Intact, Dental Advisory Given   Pulmonary sleep apnea and Continuous Positive Airway Pressure Ventilation , former smoker,    Pulmonary exam normal breath sounds clear to auscultation       Cardiovascular Exercise Tolerance: Good negative cardio ROS   Rhythm:Regular Rate:Normal     Neuro/Psych  Headaches, Anxiety Depression    GI/Hepatic Neg liver ROS, GERD  Medicated,  Endo/Other  negative endocrine ROS  Renal/GU negative Renal ROS  negative genitourinary   Musculoskeletal  (+) Arthritis , Osteoarthritis,    Abdominal   Peds  Hematology negative hematology ROS (+)   Anesthesia Other Findings   Reproductive/Obstetrics negative OB ROS                            Anesthesia Physical Anesthesia Plan  ASA: III  Anesthesia Plan: MAC   Post-op Pain Management:    Induction: Intravenous  PONV Risk Score and Plan: 3 and Propofol infusion and Treatment may vary due to age or medical condition  Airway Management Planned: Simple Face Mask  Additional Equipment:   Intra-op Plan:   Post-operative Plan:   Informed Consent: I have reviewed the patients History and Physical, chart, labs and discussed the procedure including the risks, benefits and alternatives for the proposed anesthesia with the patient or authorized representative who has indicated his/her understanding and acceptance.     Dental advisory given  Plan Discussed with: CRNA  Anesthesia Plan Comments:        Anesthesia Quick Evaluation

## 2020-05-24 ENCOUNTER — Encounter (HOSPITAL_COMMUNITY)
Admission: RE | Disposition: A | Discharge status: Home / Self Care | Payer: Self-pay | Source: Home / Self Care | Attending: Gastroenterology

## 2020-05-24 ENCOUNTER — Ambulatory Visit (HOSPITAL_COMMUNITY): Payer: PRIVATE HEALTH INSURANCE | Admitting: Anesthesiology

## 2020-05-24 ENCOUNTER — Encounter (HOSPITAL_COMMUNITY): Payer: Self-pay | Admitting: Gastroenterology

## 2020-05-24 ENCOUNTER — Other Ambulatory Visit: Payer: Self-pay

## 2020-05-24 ENCOUNTER — Ambulatory Visit (HOSPITAL_COMMUNITY)
Admission: RE | Admit: 2020-05-24 | Discharge: 2020-05-24 | Disposition: A | Discharge status: Home / Self Care | Payer: PRIVATE HEALTH INSURANCE | Attending: Gastroenterology | Admitting: Gastroenterology

## 2020-05-24 DIAGNOSIS — K449 Diaphragmatic hernia without obstruction or gangrene: Secondary | ICD-10-CM | POA: Insufficient documentation

## 2020-05-24 DIAGNOSIS — K3189 Other diseases of stomach and duodenum: Secondary | ICD-10-CM | POA: Diagnosis not present

## 2020-05-24 DIAGNOSIS — Z1211 Encounter for screening for malignant neoplasm of colon: Secondary | ICD-10-CM | POA: Insufficient documentation

## 2020-05-24 DIAGNOSIS — K319 Disease of stomach and duodenum, unspecified: Secondary | ICD-10-CM | POA: Insufficient documentation

## 2020-05-24 DIAGNOSIS — R1011 Right upper quadrant pain: Secondary | ICD-10-CM | POA: Diagnosis present

## 2020-05-24 DIAGNOSIS — K641 Second degree hemorrhoids: Secondary | ICD-10-CM | POA: Insufficient documentation

## 2020-05-24 DIAGNOSIS — D132 Benign neoplasm of duodenum: Secondary | ICD-10-CM | POA: Diagnosis not present

## 2020-05-24 DIAGNOSIS — K317 Polyp of stomach and duodenum: Secondary | ICD-10-CM | POA: Diagnosis not present

## 2020-05-24 DIAGNOSIS — K644 Residual hemorrhoidal skin tags: Secondary | ICD-10-CM | POA: Insufficient documentation

## 2020-05-24 DIAGNOSIS — Z8371 Family history of colonic polyps: Secondary | ICD-10-CM | POA: Diagnosis not present

## 2020-05-24 DIAGNOSIS — Z87891 Personal history of nicotine dependence: Secondary | ICD-10-CM | POA: Diagnosis not present

## 2020-05-24 DIAGNOSIS — K2970 Gastritis, unspecified, without bleeding: Secondary | ICD-10-CM

## 2020-05-24 HISTORY — PX: COLONOSCOPY WITH PROPOFOL: SHX5780

## 2020-05-24 HISTORY — PX: SUBMUCOSAL LIFTING INJECTION: SHX6855

## 2020-05-24 HISTORY — PX: BIOPSY: SHX5522

## 2020-05-24 HISTORY — PX: ESOPHAGOGASTRODUODENOSCOPY (EGD) WITH PROPOFOL: SHX5813

## 2020-05-24 SURGERY — COLONOSCOPY WITH PROPOFOL
Anesthesia: Monitor Anesthesia Care

## 2020-05-24 MED ORDER — PROPOFOL 1000 MG/100ML IV EMUL
INTRAVENOUS | Status: AC
  Filled 2020-05-24: qty 100

## 2020-05-24 MED ORDER — LACTATED RINGERS IV SOLN: INTRAVENOUS | Status: DC

## 2020-05-24 MED ORDER — PROPOFOL 10 MG/ML IV BOLUS
INTRAVENOUS | Status: DC | PRN
  Administered 2020-05-24 (×3): 30 mg via INTRAVENOUS
  Administered 2020-05-24: 40 mg via INTRAVENOUS

## 2020-05-24 MED ORDER — PROPOFOL 500 MG/50ML IV EMUL
INTRAVENOUS | Status: AC
  Filled 2020-05-24: qty 50

## 2020-05-24 MED ORDER — OMEPRAZOLE 40 MG PO CPDR: 40.0000 mg | DELAYED_RELEASE_CAPSULE | Freq: Two times a day (BID) | ORAL | 0 refills | Status: DC

## 2020-05-24 MED ORDER — LIDOCAINE 2% (20 MG/ML) 5 ML SYRINGE
INTRAMUSCULAR | Status: DC | PRN
  Administered 2020-05-24: 80 mg via INTRAVENOUS

## 2020-05-24 MED ORDER — PROPOFOL 500 MG/50ML IV EMUL
INTRAVENOUS | Status: DC | PRN
  Administered 2020-05-24: 150 ug/kg/min via INTRAVENOUS

## 2020-05-24 MED ORDER — SODIUM CHLORIDE 0.9 % IV SOLN: INTRAVENOUS | Status: DC

## 2020-05-24 SURGICAL SUPPLY — 24 items

## 2020-05-24 NOTE — Transfer of Care (Signed)
Immediate Anesthesia Transfer of Care Note  Patient: Caitlin Moran  Procedure(s) Performed: COLONOSCOPY WITH PROPOFOL (N/A ) ESOPHAGOGASTRODUODENOSCOPY (EGD) WITH PROPOFOL (N/A ) BIOPSY SUBMUCOSAL LIFTING INJECTION  Patient Location: Endo  Anesthesia Type:MAC  Level of Consciousness: awake, alert  and oriented  Airway & Oxygen Therapy: Patient Spontanous Breathing  Post-op Assessment: Report given to RN and Post -op Vital signs reviewed and stable  Post vital signs: Reviewed and stable  Last Vitals:  Vitals Value Taken Time  BP    Temp    Pulse 91 05/24/20 0838  Resp 20 05/24/20 0838  SpO2 96 % 05/24/20 0838  Vitals shown include unvalidated device data.  Last Pain:  Vitals:   05/24/20 0705  TempSrc: Oral  PainSc: 0-No pain         Complications: No complications documented.

## 2020-05-24 NOTE — Op Note (Signed)
[facility] Patient Name: Caitlin Moran Procedure Date: 05/24/2020 MRN: [id number] Attending MD: Justice Britain , MD Date of Birth: 12-Feb-1961 CSN: [id number] Age: 60 Admit Type: Outpatient Procedure:                Colonoscopy Indications:              Colon cancer screening in patient at increased                            risk: Family history of 1st-degree relative with                            colon polyps Providers:                Justice Britain, MD, Jeanella Cara, RN,                            Laverda Sorenson, Technician, Margurite Auerbach Referring MD:             Deland Pretty Medicines:                Monitored Anesthesia Care Complications:            No immediate complications. Estimated Blood Loss:     Estimated blood loss was minimal. Estimated blood                            loss: none. Procedure:                Pre-Anesthesia Assessment:                           - Prior to the procedure, a History and Physical                            was performed, and patient medications and                            allergies were reviewed. The patient's tolerance of                            previous anesthesia was also reviewed. The risks                            and benefits of the procedure and the sedation                            options and risks were discussed with the patient.                            All questions were answered, and informed consent                            was obtained. Prior Anticoagulants: The patient has                            taken no previous  anticoagulant or antiplatelet                            agents. ASA Grade Assessment: III - A patient with                            severe systemic disease. After reviewing the risks                            and benefits, the patient was deemed in                            satisfactory condition to undergo the procedure.                           After obtaining  informed consent, the colonoscope                            was passed under direct vision. Throughout the                            procedure, the patient's blood pressure, pulse, and                            oxygen saturations were monitored continuously. The                            [id number]-HQ190L ([id number] Olympus colonoscope was                            introduced through the anus and advanced to the 5                            cm into the ileum. The colonoscopy was performed                            without difficulty. The patient tolerated the                            procedure. The quality of the bowel preparation was                            adequate. The terminal ileum, ileocecal valve,                            appendiceal orifice, and rectum were photographed. Scope In: 8:10:58 AM Scope Out: 8:30:01 AM Scope Withdrawal Time: 0 hours 15 minutes 25 seconds  Total Procedure Duration: 0 hours 19 minutes 3 seconds  Findings:      The digital rectal exam findings include hemorrhoids. Pertinent       negatives include no palpable rectal lesions.      A moderate amount of liquid semi-liquid stool was found in the entire       colon, interfering with visualization. Lavage of the area was performed  using copious amounts, resulting in clearance with adequate       visualization.      The terminal ileum and ileocecal valve appeared normal.      Normal mucosa was found in the entire colon.      Non-bleeding non-thrombosed external and internal hemorrhoids were found       during retroflexion, during perianal exam and during digital exam. The       hemorrhoids were Grade II (internal hemorrhoids that prolapse but reduce       spontaneously). Impression:               - Hemorrhoids found on digital rectal exam.                           - Stool in the entire examined colon.                           - The examined portion of the ileum was normal.                            - Normal mucosa in the entire examined colon.                           - Non-bleeding non-thrombosed external and internal                            hemorrhoids. Moderate Sedation:      Not Applicable - Patient had care per Anesthesia. Recommendation:           - The patient will be observed post-procedure,                            until all discharge criteria are met.                           - Discharge patient to home.                           - Patient has a contact number available for                            emergencies. The signs and symptoms of potential                            delayed complications were discussed with the                            patient. Return to normal activities tomorrow.                            Written discharge instructions were provided to the                            patient.                           - High fiber  diet.                           - Use FiberCon 1-2 tablets PO daily.                           - Continue present medications.                           - Repeat colonoscopy in 5-10 years for screening                            purposes. As she has had 2 normal colonoscopies                            without polyps in 10-years, I am OK to proceed with                            a 10-year follow up, unless patient wants to                            maintain 5-year for 1 more procedure. Will be                            discussion in future.                           - The findings and recommendations were discussed                            with the patient.                           - The findings and recommendations were discussed                            with the patient's family. Procedure Code(s):        --- Professional ---                           [phone], Colonoscopy, flexible; diagnostic, including                            collection of specimen(s) by brushing or washing,                            when  performed (separate procedure) Diagnosis Code(s):        --- Professional ---                           Z83.71, Family history of colonic polyps                           K64.1, Second degree hemorrhoids CPT copyright 2019 American Medical Association. All rights reserved. The codes documented in this report are preliminary and upon coder  review may  be revised to meet current compliance requirements. Justice Britain, MD 05/24/2020 8:49:49 AM Number of Addenda: 0

## 2020-05-24 NOTE — Discharge Instructions (Signed)
YOU HAD AN ENDOSCOPIC PROCEDURE TODAY: Refer to the procedure report and other information in the discharge instructions given to you for any specific questions about what was found during the examination. If this information does not answer your questions, please call [address] office at [phone] to clarify.  ° °YOU SHOULD EXPECT: Some feelings of bloating in the abdomen. Passage of more gas than usual. Walking can help get rid of the air that was put into your GI tract during the procedure and reduce the bloating. If you had a lower endoscopy (such as a colonoscopy or flexible sigmoidoscopy) you may notice spotting of blood in your stool or on the toilet paper. Some abdominal soreness may be present for a day or two, also. ° °DIET: Your first meal following the procedure should be a light meal and then it is ok to progress to your normal diet. A half-sandwich or bowl of soup is an example of a good first meal. Heavy or fried foods are harder to digest and may make you feel nauseous or bloated. Drink plenty of fluids but you should avoid alcoholic beverages for 24 hours. If you had a esophageal dilation, please see attached instructions for diet.   ° °ACTIVITY: Your care partner should take you home directly after the procedure. You should plan to take it easy, moving slowly for the rest of the day. You can resume normal activity the day after the procedure however YOU SHOULD NOT DRIVE, use power tools, machinery or perform tasks that involve climbing or major physical exertion for 24 hours (because of the sedation medicines used during the test).  ° °SYMPTOMS TO REPORT IMMEDIATELY: °A gastroenterologist can be reached at any hour. Please call [phone]  for any of the following symptoms:  °Following lower endoscopy (colonoscopy, flexible sigmoidoscopy) °Excessive amounts of blood in the stool  °Significant tenderness, worsening of abdominal pains  °Swelling of the abdomen that is new, acute  °Fever of 100° or  higher  °Following upper endoscopy (EGD, EUS, ERCP, esophageal dilation) °Vomiting of blood or coffee ground material  °New, significant abdominal pain  °New, significant chest pain or pain under the shoulder blades  °Painful or persistently difficult swallowing  °New shortness of breath  °Black, tarry-looking or red, bloody stools ° °FOLLOW UP:  °If any biopsies were taken you will be contacted by phone or by letter within the next 1-3 weeks. Call [phone]  if you have not heard about the biopsies in 3 weeks.  °Please also call with any specific questions about appointments or follow up tests. ° °

## 2020-05-24 NOTE — H&P (Signed)
GASTROENTEROLOGY PROCEDURE H&P NOTE   Primary Care Physician: Deland Pretty, MD  HPI: Caitlin Moran is a 60 y.o. female who presents for EGD with EMR of Duodenal adenoma and Colonoscopy for screening.  Has abdominal pain that is NOS.  Past Medical History:  Diagnosis Date  . Anxiety   . Arthritis    neck  . Depression   . GERD (gastroesophageal reflux disease)   . Headache   . History of kidney stones    kidney stone -lithrotrispy 04/30/18- 2 current - stable  . IBS (irritable bowel syndrome)   . OSA (obstructive sleep apnea)    on CPAP  . Polycythemia vera ([place])   . PONV (postoperative nausea and vomiting)    Didn't go to sleep all the way  during colonoscopy.  . Renal calculus, right   . Right ureteral stone   . Spinal stenosis    Past Surgical History:  Procedure Laterality Date  . ABLATION     unquestionable  . BIOPSY  05/01/2018   Procedure: BIOPSY;  Surgeon: Rush Landmark Telford Nab., MD;  Location: [place];  Service: Gastroenterology;;  . CESAREAN SECTION    . CHOLECYSTECTOMY    . COLONOSCOPY    . ESOPHAGOGASTRODUODENOSCOPY    . ESOPHAGOGASTRODUODENOSCOPY N/A 05/01/2018   Procedure: ESOPHAGOGASTRODUODENOSCOPY (EGD);  Surgeon: Irving Copas., MD;  Location: [place];  Service: Gastroenterology;  Laterality: N/A;  . EUS N/A 05/01/2018   Procedure: UPPER ENDOSCOPIC ULTRASOUND (EUS) RADIAL;  Surgeon: Rush Landmark Telford Nab., MD;  Location: [place];  Service: Gastroenterology;  Laterality: N/A;  . EXTRACORPOREAL SHOCK WAVE LITHOTRIPSY  02/14/2010  . HYSTEROSCOPY WITH D & C N/A 11/27/2012   Procedure: DILATATION AND CURETTAGE /HYSTEROSCOPY;  Surgeon: Marylynn Pearson, MD;  Location: [facility];  Service: Gynecology;  Laterality: N/A;  . WISDOM TOOTH EXTRACTION     Current Facility-Administered Medications  Medication Dose Route Frequency Provider Last Rate Last Admin  . 0.9 %  sodium chloride infusion   Intravenous Continuous Mansouraty, Telford Nab., MD      . lactated ringers infusion   Intravenous Continuous Mansouraty, Telford Nab., MD 20 mL/hr at 05/24/20 0711 New Bag at 05/24/20 0711   No Known Allergies Family History  Problem Relation Age of Onset  . Breast cancer Maternal Aunt   . Esophageal cancer Mother   . Lung cancer Father   . CAD Father   . Colon cancer Neg Hx   . Inflammatory bowel disease Neg Hx   . Liver disease Neg Hx   . Pancreatic cancer Neg Hx   . Stomach cancer Neg Hx   . Rectal cancer Neg Hx    Social History   Socioeconomic History  . Marital status: Married    Spouse name: Not on file  . Number of children: 2  . Years of education: Not on file  . Highest education level: Not on file  Occupational History  . Occupation: Therapist, sports  Tobacco Use  . Smoking status: Former Smoker    Quit date: 11/05/1990    Years since quitting: 29.5  . Smokeless tobacco: Never Used  Vaping Use  . Vaping Use: Never used  Substance and Sexual Activity  . Alcohol use: Yes    Alcohol/week: 7.0 standard drinks    Types: 7 Glasses of wine per week    Comment: 5  . Drug use: No  . Sexual activity: Not on file  Other Topics Concern  . Not on file  Social History Narrative  .  Not on file   Social Determinants of Health   Financial Resource Strain: Not on file  Food Insecurity: Not on file  Transportation Needs: Not on file  Physical Activity: Not on file  Stress: Not on file  Social Connections: Not on file  Intimate Partner Violence: Not on file    Physical Exam: Vital signs in last 24 hours: Temp:  [97.8 F (36.6 C)] 97.8 F (36.6 C) (03/14 0705) Pulse Rate:  [60] 60 (03/14 0705) Resp:  [18] 18 (03/14 0705) BP: (148)/(89) 148/89 (03/14 0705) SpO2:  [99 %] 99 % (03/14 0705) Weight:  [95.3 kg] 95.3 kg (03/14 0705)   GEN: NAD EYE: Sclerae anicteric ENT: MMM CV: Non-tachycardic GI: Soft, rounded, TTP in abdomen noted NEURO:  Alert & Oriented x 3  Lab Results: No results for input(s): WBC, HGB, HCT,  PLT in the last 72 hours. BMET No results for input(s): NA, K, CL, CO2, GLUCOSE, BUN, CREATININE, CALCIUM in the last 72 hours. LFT No results for input(s): PROT, ALBUMIN, AST, ALT, ALKPHOS, BILITOT, BILIDIR, IBILI in the last 72 hours. PT/INR No results for input(s): LABPROT, INR in the last 72 hours.   Impression / Plan: This is a 60 y.o.female who presents for EGD with EMR of Duodenal adenoma and Colonoscopy for screening.  Has abdominal pain that is NOS.  The risks and benefits of endoscopic evaluation were discussed with the patient; these include but are not limited to the risk of perforation, infection, bleeding, missed lesions, lack of diagnosis, severe illness requiring hospitalization, as well as anesthesia and sedation related illnesses.  The patient is agreeable to proceed.    Justice Britain, MD [facility] Office # [phone]

## 2020-05-24 NOTE — Anesthesia Postprocedure Evaluation (Signed)
Anesthesia Post Note  Patient: Rheya Minogue  Procedure(s) Performed: COLONOSCOPY WITH PROPOFOL (N/A ) ESOPHAGOGASTRODUODENOSCOPY (EGD) WITH PROPOFOL (N/A ) BIOPSY SUBMUCOSAL LIFTING INJECTION     Patient location during evaluation: Endoscopy Anesthesia Type: MAC Level of consciousness: awake and alert Pain management: pain level controlled Vital Signs Assessment: post-procedure vital signs reviewed and stable Respiratory status: spontaneous breathing, nonlabored ventilation and respiratory function stable Cardiovascular status: stable and blood pressure returned to baseline Postop Assessment: no apparent nausea or vomiting Anesthetic complications: no   No complications documented.  Last Vitals:  Vitals:   05/24/20 0900 05/24/20 0910  BP: 106/83 106/83  Pulse: 64 (!) 58  Resp: 20 17  Temp:    SpO2: 94% 93%    Last Pain:  Vitals:   05/24/20 0910  TempSrc:   PainSc: 0-No pain                 Ghassan Coggeshall,W. EDMOND

## 2020-05-24 NOTE — Op Note (Signed)
[facility] Patient Name: Caitlin Moran Procedure Date: 05/24/2020 MRN: [id number] Attending MD: Justice Britain , MD Date of Birth: 07-21-1960 CSN: [id number] Age: 60 Admit Type: Outpatient Procedure:                Upper GI endoscopy Indications:              Abdominal pain in the right upper quadrant, Polyps                            in the duodenum, For therapy of polyps in the                            duodenum Providers:                Justice Britain, MD, Jeanella Cara, RN,                            Laverda Sorenson, Technician, Margurite Auerbach Referring MD:             Deland Pretty Medicines:                Monitored Anesthesia Care Complications:            No immediate complications. Estimated Blood Loss:     Estimated blood loss was minimal. Procedure:                Pre-Anesthesia Assessment:                           - Prior to the procedure, a History and Physical                            was performed, and patient medications and                            allergies were reviewed. The patient's tolerance of                            previous anesthesia was also reviewed. The risks                            and benefits of the procedure and the sedation                            options and risks were discussed with the patient.                            All questions were answered, and informed consent                            was obtained. Prior Anticoagulants: The patient has                            taken no previous anticoagulant or antiplatelet  agents. ASA Grade Assessment: III - A patient with                            severe systemic disease. After reviewing the risks                            and benefits, the patient was deemed in                            satisfactory condition to undergo the procedure.                           After obtaining informed consent, the endoscope was                             passed under direct vision. Throughout the                            procedure, the patient's blood pressure, pulse, and                            oxygen saturations were monitored continuously. The                            [id number]-[id number] ([id number] Olympus gastroscope was                            introduced through the mouth, and advanced to the                            second part of duodenum. The Olympus [id number]                            [phone]) was introduced through the mouth, and                            advanced to the second part of duodenum. The upper                            GI endoscopy was accomplished without difficulty.                            The patient tolerated the procedure. Scope In: Scope Out: Findings:      No gross lesions were noted in the entire esophagus.      The Z-line was irregular and was found 38 cm from the incisors.      A 2 cm hiatal hernia was present.      An endoclip was found in the cardia.      Multiple small pedunculated and sessile polyps with no bleeding and no       stigmata of recent bleeding were found in the gastric body - previously       biopsied and consistent with fundic gland polyps.      Striped moderate inflammation characterized by erythema and friability  was found in the gastric body. Biopsies were taken with a cold forceps       for histology and Helicobacter pylori testing.      A single 10 mm semi-sessile polyp with no bleeding was found in the       second portion of the duodenum. Preparations were made for mucosal       resection. NBI imaging and White-light endoscopy was done to demarcate       the borders of the lesion. Orise gel was injected to raise the lesion.       Piecemeal cold snare mucosal resection was performed. Resection and       retrieval were complete.      The major papilla and minor papilla were normal.      No other gross lesions were noted in the entire visualized  D1/D2. Impression:               - No gross lesions in esophagus. Z-line irregular,                            38 cm from the incisors.                           - 2 cm hiatal hernia.                           - An endoclip was found in the cardia.                           - Multiple gastric polyps - fundic gland.                           - Gastritis. Biopsied.                           - A single duodenal polyp. Resected and retrieved                            with piecemeal cold mucosal resection.                           - Normal major papilla and minor papilla.                           - No other gross lesions in the entire visualized                            D1/D2. Moderate Sedation:      Not Applicable - Patient had care per Anesthesia. Recommendation:           - Proceed to scheduled colonoscopy.                           - Observe patient's clinical course.                           - Await pathology results.                           -  Repeat upper endoscopy in 1 year for surveillance                            of known duodenal adenoma.                           - Increase Omeprazole to 40 mg twice daily for 1                            month and then decrease back to 20 mg twice daily                            as already taking.                           - The findings and recommendations were discussed                            with the patient.                           - The findings and recommendations were discussed                            with the patient's family. Procedure Code(s):        --- Professional ---                           [phone], Esophagogastroduodenoscopy, flexible,                            transoral; with endoscopic mucosal resection Diagnosis Code(s):        --- Professional ---                           K22.8, Other specified diseases of esophagus                           K44.9, Diaphragmatic hernia without obstruction or                             gangrene                           K31.7, Polyp of stomach and duodenum                           K29.70, Gastritis, unspecified, without bleeding                           R10.11, Right upper quadrant pain CPT copyright 2019 American Medical Association. All rights reserved. The codes documented in this report are preliminary and upon coder review may  be revised to meet current compliance requirements. Justice Britain, MD 05/24/2020 8:44:32 AM Number of Addenda: 0

## 2020-05-25 ENCOUNTER — Other Ambulatory Visit: Payer: Self-pay

## 2020-05-25 ENCOUNTER — Encounter (HOSPITAL_COMMUNITY): Payer: Self-pay | Admitting: Gastroenterology

## 2020-05-25 DIAGNOSIS — R1011 Right upper quadrant pain: Secondary | ICD-10-CM

## 2020-05-26 LAB — SURGICAL PATHOLOGY

## 2020-05-29 ENCOUNTER — Encounter: Payer: Self-pay | Admitting: Gastroenterology

## 2020-06-29 ENCOUNTER — Telehealth (INDEPENDENT_AMBULATORY_CARE_PROVIDER_SITE_OTHER): Payer: PRIVATE HEALTH INSURANCE | Admitting: Family Medicine

## 2020-06-29 ENCOUNTER — Encounter: Payer: Self-pay | Admitting: Family Medicine

## 2020-06-29 DIAGNOSIS — G4733 Obstructive sleep apnea (adult) (pediatric): Secondary | ICD-10-CM | POA: Diagnosis not present

## 2020-06-29 DIAGNOSIS — Z9989 Dependence on other enabling machines and devices: Secondary | ICD-10-CM | POA: Diagnosis not present

## 2020-06-29 NOTE — Progress Notes (Signed)
PATIENT: Caitlin Moran DOB: February 24, 1961  REASON FOR VISIT: follow up HISTORY FROM: patient  Virtual Visit via Telephone Note  I connected with Caitlin Moran on 06/29/20 at  8:30 AM EDT by telephone and verified that I am speaking with the correct person using two identifiers.   I discussed the limitations, risks, security and privacy concerns of performing an evaluation and management service by telephone and the availability of in person appointments. I also discussed with the patient that there may be a patient responsible charge related to this service. The patient expressed understanding and agreed to proceed.   History of Present Illness:  06/29/20 ALL:  Caitlin Moran is a 60 y.o. female here today for follow up for OSA on CPAP. She continues to struggle with 4 hour compliance. She denies concerns with machine or supplies. New mask did not seem to help with 4 hour compliance. Leak has improved. She takes her mask off during the night at some point and does not replace. She is uncertain why she is removing the mask. She does report hearing noises from the machine that seem to wake her.     12/29/19 ALL:  Caitlin Moran is a 60 y.o. female here today for follow up for OSA on CPAP.  She admits that she continues to struggle with 4-hour compliance.  She reports that she will wake up in the middle of the night and is unable to get back to sleep because she can hear the machine running.  She is currently using a full facemask and has noted a leak.  She is interested in trying a new mask.  She is motivated to continue using CPAP therapy.  She is uncertain if she has noted significant benefit but knows risk of untreated sleep apnea.  Compliance report dated 11/17/2019 through 12/16/2019 reveals that she used CPAP 23 of the past 30 days for compliance of 77%.  She is CPAP greater than 4 hours 8 of the past 30 days for compliance of 27%.  Average usage on days used was 3 hours and  27 minutes.  Residual AHI was 2.0 on 6 to 16 cm of water and an EPR of 3.  There was a significant leak noted in the 95th percentile of 37.1 L/min.   Observations/Objective:  Generalized: Well developed, in no acute distress  Mentation: Alert oriented to time, place, history taking. Follows all commands speech and language fluent   Assessment and Plan:  60 y.o. year old female  has a past medical history of Anxiety, Arthritis, Depression, GERD (gastroesophageal reflux disease), Headache, History of kidney stones, IBS (irritable bowel syndrome), OSA (obstructive sleep apnea), Polycythemia vera ([place]), PONV (postoperative nausea and vomiting), Renal calculus, right, Right ureteral stone, and Spinal stenosis. here with    ICD-10-CM   1. OSA on CPAP  G47.33 For home use only DME continuous positive airway pressure (CPAP)   Z99.89    [name] continues to struggle meeting 4 hour compliance. Last 30 and 90 day downloads show sub optimal 4 hour compliance. Data stops recording on 06/11/2020 but she reports she is using CPAP nightly. I will send orders to [facility] for reeducation on usage of CPAP and ask that they make sure she has a good fitting mask and no concerns with machine. I have encouraged her to focus on compliance. I will repeat download in 6-8 weeks. If no improvement, we will bring her in to discuss other options in management of apnea. She has  severe complex, central dominant apnea. CPAP is best option for management. May consider anxiety treatment at night, if indicated. She verbalizes understanding and agreement with this plan.   Orders Placed This Encounter  Procedures  . For home use only DME continuous positive airway pressure (CPAP)    Reeducation please, patient takes mask off during the night. Leak improved but want to ensure correct fit and no concerns with machine. Last download shows no usage since 06/11/2020, however, she is adamant that she is using it nightly.    Order Specific  Question:   Length of Need    Answer:   Lifetime    Order Specific Question:   Patient has OSA or probable OSA    Answer:   Yes    Order Specific Question:   Is the patient currently using CPAP in the home    Answer:   Yes    Order Specific Question:   Settings    Answer:   Other see comments    Order Specific Question:   CPAP supplies needed    Answer:   Mask, headgear, cushions, filters, heated tubing and water chamber    No orders of the defined types were placed in this encounter.    Follow Up Instructions:  I discussed the assessment and treatment plan with the patient. The patient was provided an opportunity to ask questions and all were answered. The patient agreed with the plan and demonstrated an understanding of the instructions.   The patient was advised to call back or seek an in-person evaluation if the symptoms worsen or if the condition fails to improve as anticipated.  I provided 15 minutes of non-face-to-face time during this encounter. Patient located at their place of residence during [place] visit. Provider is in the office.    Debbora Presto, NP

## 2020-07-01 ENCOUNTER — Encounter: Payer: Self-pay | Admitting: Family Medicine

## 2020-07-02 NOTE — Progress Notes (Signed)
Data recording has been interrupted for several patients with [facility] now [facility]. The air leak remains high- is this an oral leak? Can she feel air escaping? She may be a candidate for alternative therapy if BMI is 32 or lower and if she has OSA, without REM dependence. CD  PS: Please mention the primary neurologist at [facility] at the beginning of your notes. This way its easier to know the attending when procedures are scheduled.

## 2020-07-09 ENCOUNTER — Other Ambulatory Visit: Payer: Self-pay

## 2020-07-09 ENCOUNTER — Ambulatory Visit (HOSPITAL_COMMUNITY)
Admission: RE | Admit: 2020-07-09 | Discharge: 2020-07-09 | Disposition: A | Discharge status: Home / Self Care | Payer: PRIVATE HEALTH INSURANCE | Source: Ambulatory Visit | Attending: Gastroenterology | Admitting: Gastroenterology

## 2020-07-09 DIAGNOSIS — R1011 Right upper quadrant pain: Secondary | ICD-10-CM | POA: Diagnosis present

## 2020-07-09 MED ORDER — GADOBUTROL 1 MMOL/ML IV SOLN
10.0000 mL | Freq: Once | INTRAVENOUS | Status: AC | PRN
  Administered 2020-07-09: 10 mL via INTRAVENOUS

## 2020-07-12 ENCOUNTER — Other Ambulatory Visit: Payer: Self-pay | Admitting: Gastroenterology

## 2020-07-12 DIAGNOSIS — R1011 Right upper quadrant pain: Secondary | ICD-10-CM

## 2020-07-15 ENCOUNTER — Encounter: Payer: Self-pay | Admitting: Family Medicine

## 2020-08-31 ENCOUNTER — Telehealth: Payer: Self-pay | Admitting: Family Medicine

## 2020-08-31 NOTE — Telephone Encounter (Signed)
Please let her know that I have reassessed her CPAP compliance report. Her daily compliance has dropped a little bit and now 60% but four hour compliance has improved to 30% compliance. I sent orders for reeducation and to assess her machine as she was hearing noises from the machine that she felt were waking her from sleep. Her leak is improved. Can you make sure she was able to meet with DME regarding noises. Is she willing to continue CPAP? I would like to see her back in 3-4 months if she wishes to continue CPAP. TY!

## 2020-08-31 NOTE — Telephone Encounter (Signed)
Spoke to pt and relayed message per Amy,NP. Pt has been using her CPAP, pt wishes to continue therapy. She has been to her DME and they didn't seem to find the noise. Per pt the noise hasn't come back in a while. I have sch a f/u for Oct 17th.

## 2020-09-10 ENCOUNTER — Ambulatory Visit (INDEPENDENT_AMBULATORY_CARE_PROVIDER_SITE_OTHER): Payer: PRIVATE HEALTH INSURANCE | Admitting: Podiatry

## 2020-09-10 ENCOUNTER — Other Ambulatory Visit: Payer: Self-pay

## 2020-09-10 DIAGNOSIS — M7731 Calcaneal spur, right foot: Secondary | ICD-10-CM

## 2020-09-10 DIAGNOSIS — M216X9 Other acquired deformities of unspecified foot: Secondary | ICD-10-CM | POA: Diagnosis not present

## 2020-09-10 DIAGNOSIS — M7661 Achilles tendinitis, right leg: Secondary | ICD-10-CM

## 2020-09-10 NOTE — Progress Notes (Signed)
  Subjective:  Patient ID: Caitlin Moran, female    DOB: [birth year],  MRN: [id number]  No chief complaint on file.  60 y.o. female presents with the above complaint. History confirmed with patient. States that the back of the heel is flared up has been hurting more recently, has been on her feet a lot while preparing to move.  Objective:  Physical Exam: warm, good capillary refill, no trophic changes or ulcerative lesions, normal DP and PT pulses and normal sensory exam. Right Foot:  POP posterior heel with prominent haglund deformity, no pain at watershed region, decreased ankle joint ROM    Assessment:   1. Tendonitis, Achilles, right   2. Equinus deformity of foot   3. Calcaneal spur of right foot    Plan:  Patient was evaluated and treated and all questions answered.  Achilles Tendonitis -Rx Cam boot. Dispensed today -Discussed rest, icing.  -Continue voltaren gel      No follow-ups on file.

## 2020-10-01 ENCOUNTER — Ambulatory Visit: Payer: PRIVATE HEALTH INSURANCE | Admitting: Podiatry

## 2020-10-15 ENCOUNTER — Ambulatory Visit (INDEPENDENT_AMBULATORY_CARE_PROVIDER_SITE_OTHER): Payer: PRIVATE HEALTH INSURANCE | Admitting: Podiatry

## 2020-10-15 ENCOUNTER — Other Ambulatory Visit: Payer: Self-pay

## 2020-10-15 DIAGNOSIS — M7661 Achilles tendinitis, right leg: Secondary | ICD-10-CM | POA: Diagnosis not present

## 2020-10-15 DIAGNOSIS — M7731 Calcaneal spur, right foot: Secondary | ICD-10-CM | POA: Diagnosis not present

## 2020-10-15 DIAGNOSIS — M216X9 Other acquired deformities of unspecified foot: Secondary | ICD-10-CM

## 2020-10-15 NOTE — Progress Notes (Signed)
  Subjective:  Patient ID: Caitlin Moran, female    DOB: 12/08/1960,  MRN: [id number]  Chief Complaint  Patient presents with   Foot Problem    Achilles tendonitis f/u.- Patient states her right foot is flaring back up after giving the cam boot a break    60 y.o. female presents with the above complaint. History confirmed with patient.  Objective:  Physical Exam: warm, good capillary refill, no trophic changes or ulcerative lesions, normal DP and PT pulses and normal sensory exam. Right Foot:  POP posterior heel with prominent haglund deformity, no pain at watershed region, decreased ankle joint ROM    Assessment:   1. Tendonitis, Achilles, right   2. Equinus deformity of foot   3. Calcaneal spur of right foot    Plan:  Patient was evaluated and treated and all questions answered.  Achilles Tendonitis -Continues cam boot combined with rest and icing  -Continue voltaren gel  -Refer to PT.  Consider iontophoresis -Should pain persist would consider microdebridement of the tendon to promote healing    No follow-ups on file.

## 2020-10-26 NOTE — Progress Notes (Addendum)
10/28/20 5:15 PM   Caitlin Moran [birth year] [id number]  Referring provider:  Deland Pretty, MD [address] [place],  [place] [address] Chief Complaint  Patient presents with   Nephrolithiasis     HPI: Caitlin Moran is a 60 y.o.female who presents today   She was seen 09/23/2020 by Dr Deland Pretty with possible kidney stone. She had right- sided flank pain. She has a past history of kidney stones and was referred to urology in the past s/p ESWL.   She had some intermittent lower and right-sided abdominal pain over the past 1-2 months.  This is situated over her right flank and radiates to her right upper quadrant.  She was seen and evaluated by gastroenterology.  She thought that she had a CT scan 2 months ago however this in fact was an MRI of the abdomen with and without contrast/MRCP.  This showed no GU pathology.  She denies any urinary symptoms including urgency frequency.  Urinalysis today is somewhat suspicious.    No fevers or chills.  No nausea or vomiting.   PMH: Past Medical History:  Diagnosis Date   Anxiety    Arthritis    neck   Depression    GERD (gastroesophageal reflux disease)    Headache    History of kidney stones    kidney stone -lithrotrispy 04/30/18- 2 current - stable   IBS (irritable bowel syndrome)    OSA (obstructive sleep apnea)    on CPAP   Polycythemia vera ([facility])    PONV (postoperative nausea and vomiting)    Didn't go to sleep all the way  during colonoscopy.   Renal calculus, right    Right ureteral stone    Spinal stenosis     Surgical History: Past Surgical History:  Procedure Laterality Date   ABLATION     unquestionable   BIOPSY  05/01/2018   Procedure: BIOPSY;  Surgeon: Rush Landmark Telford Nab., MD;  Location: [facility];  Service: Gastroenterology;;   BIOPSY  05/24/2020   Procedure: BIOPSY;  Surgeon: Irving Copas., MD;  Location: [facility];  Service: Gastroenterology;;   CESAREAN  SECTION     CHOLECYSTECTOMY     COLONOSCOPY     COLONOSCOPY WITH PROPOFOL N/A 05/24/2020   Procedure: COLONOSCOPY WITH PROPOFOL;  Surgeon: Irving Copas., MD;  Location: [facility];  Service: Gastroenterology;  Laterality: N/A;   ESOPHAGOGASTRODUODENOSCOPY     ESOPHAGOGASTRODUODENOSCOPY N/A 05/01/2018   Procedure: ESOPHAGOGASTRODUODENOSCOPY (EGD);  Surgeon: Irving Copas., MD;  Location: [facility];  Service: Gastroenterology;  Laterality: N/A;   ESOPHAGOGASTRODUODENOSCOPY (EGD) WITH PROPOFOL N/A 05/24/2020   Procedure: ESOPHAGOGASTRODUODENOSCOPY (EGD) WITH PROPOFOL;  Surgeon: Rush Landmark Telford Nab., MD;  Location: [facility];  Service: Gastroenterology;  Laterality: N/A;   EUS N/A 05/01/2018   Procedure: UPPER ENDOSCOPIC ULTRASOUND (EUS) RADIAL;  Surgeon: Irving Copas., MD;  Location: [facility];  Service: Gastroenterology;  Laterality: N/A;   EXTRACORPOREAL SHOCK WAVE LITHOTRIPSY  02/14/2010   HYSTEROSCOPY WITH D & C N/A 11/27/2012   Procedure: DILATATION AND CURETTAGE /HYSTEROSCOPY;  Surgeon: Marylynn Pearson, MD;  Location: [facility];  Service: Gynecology;  Laterality: N/A;   SUBMUCOSAL LIFTING INJECTION  05/24/2020   Procedure: SUBMUCOSAL LIFTING INJECTION;  Surgeon: Rush Landmark Telford Nab., MD;  Location: [facility];  Service: Gastroenterology;;   WISDOM TOOTH EXTRACTION      Home Medications:  Allergies as of 10/27/2020   No Known Allergies      Medication List  Accurate as of October 27, 2020 11:59 PM. If you have any questions, ask your nurse or doctor.          aspirin EC 81 MG tablet Take 81 mg by mouth daily.   Calcium-Vitamin D 600-400 MG-UNIT Tabs Take 1 tablet by mouth daily.   celecoxib 200 MG capsule Commonly known as: CELEBREX Take 200 mg by mouth daily.   clobetasol cream 0.05 % Commonly known as: TEMOVATE Apply 1 application topically daily as needed (for psoriasis).   fish oil-omega-3 fatty acids 1000 MG  capsule Take 1 g by mouth daily.   Golimumab 50 MG/0.5ML Soaj Inject 50 mg into the skin every 30 (thirty) days.   HYDROcodone-acetaminophen 7.5-325 MG tablet Commonly known as: NORCO Take 1 tablet by mouth every 6 (six) hours as needed for moderate pain.   omeprazole 40 MG capsule Commonly known as: PRILOSEC Take 1 capsule (40 mg total) by mouth 2 (two) times daily before a meal.   PROBIOTIC PO Take 1 capsule by mouth daily.   rosuvastatin 5 MG tablet Commonly known as: CRESTOR Take 5 mg by mouth daily.   tamsulosin 0.4 MG Caps capsule Commonly known as: FLOMAX Take 1 capsule (0.4 mg total) by mouth daily. Started by: Hollice Espy, MD   tiZANidine 2 MG tablet Commonly known as: ZANAFLEX Take 2 mg by mouth 3 (three) times daily as needed.   Vitamin D 50 MCG (2000 UT) Caps Take 2,000 Units by mouth daily.   vortioxetine HBr 20 MG Tabs tablet Commonly known as: TRINTELLIX Take 20 mg by mouth daily.   Wegovy 1 MG/0.5ML Soaj Generic drug: Semaglutide-Weight Management 1 mg        Allergies: No Known Allergies  Family History: Family History  Problem Relation Age of Onset   Breast cancer Maternal Aunt    Esophageal cancer Mother    Lung cancer Father    CAD Father    Colon cancer Neg Hx    Inflammatory bowel disease Neg Hx    Liver disease Neg Hx    Pancreatic cancer Neg Hx    Stomach cancer Neg Hx    Rectal cancer Neg Hx     Social History:  reports that she quit smoking about 30 years ago. She has never used smokeless tobacco. She reports current alcohol use of about 7.0 standard drinks per week. She reports that she does not use drugs.   Physical Exam: BP 118/80   Pulse 67   Ht 5' 6.5" (1.689 m)   Wt 210 lb (95.3 kg)   LMP 09/24/2012   BMI 33.39 kg/m   Constitutional:  Alert and oriented, No acute distress. HEENT: [place] AT, moist mucus membranes.  Trachea midline, no masses. Cardiovascular: No clubbing, cyanosis, or edema. Respiratory: Normal  respiratory effort, no increased work of breathing. GI: Abdomen is soft, nontender, nondistended, no abdominal masses GU: No CVA tenderness Skin: No rashes, bruises or suspicious lesions. Neurologic: Grossly intact, no focal deficits, moving all 4 extremities. Psychiatric: Normal mood and affect.  Laboratory Data: Lab Results  Component Value Date   CREATININE 1.01 05/17/2020    Urinalysis:  Urinalysis today showed 6-10 WBC, 11-30 RBC, nitrate negative, and no bacteria.    Assessment & Plan:    Right flank pain - CT stone scan scheduled (stone not well visualized on MR) - Flomax prescribed  -Indications for more urgent/emergent follow-up discussed  2. History of stones  - See above   3. Hematuria  - Urinalysis today  showed trace amounts of blood today - Send urine to rule out infection   Follow-up in one week for CT  Conley Rolls as a scribe for Hollice Espy, MD.,have documented all relevant documentation on the behalf of Hollice Espy, MD,as directed by  Hollice Espy, MD while in the presence of Hollice Espy, MD.  I have reviewed the above documentation for accuracy and completeness, and I agree with the above.   Hollice Espy, MD  [facility] [address] [place], [place] [address] [phone]

## 2020-10-27 ENCOUNTER — Other Ambulatory Visit: Payer: Self-pay | Admitting: Urology

## 2020-10-27 ENCOUNTER — Ambulatory Visit (INDEPENDENT_AMBULATORY_CARE_PROVIDER_SITE_OTHER): Payer: PRIVATE HEALTH INSURANCE | Admitting: Urology

## 2020-10-27 ENCOUNTER — Ambulatory Visit: Payer: PRIVATE HEALTH INSURANCE | Attending: Podiatry

## 2020-10-27 ENCOUNTER — Encounter: Payer: Self-pay | Admitting: Urology

## 2020-10-27 ENCOUNTER — Other Ambulatory Visit: Payer: Self-pay

## 2020-10-27 ENCOUNTER — Ambulatory Visit: Payer: PRIVATE HEALTH INSURANCE | Admitting: Urology

## 2020-10-27 VITALS — BP 118/80 | HR 67 | Ht 66.5 in | Wt 210.0 lb

## 2020-10-27 DIAGNOSIS — M6281 Muscle weakness (generalized): Secondary | ICD-10-CM | POA: Insufficient documentation

## 2020-10-27 DIAGNOSIS — R2681 Unsteadiness on feet: Secondary | ICD-10-CM

## 2020-10-27 DIAGNOSIS — R109 Unspecified abdominal pain: Secondary | ICD-10-CM | POA: Diagnosis not present

## 2020-10-27 DIAGNOSIS — M25571 Pain in right ankle and joints of right foot: Secondary | ICD-10-CM | POA: Diagnosis present

## 2020-10-27 DIAGNOSIS — M25671 Stiffness of right ankle, not elsewhere classified: Secondary | ICD-10-CM | POA: Diagnosis present

## 2020-10-27 DIAGNOSIS — N201 Calculus of ureter: Secondary | ICD-10-CM | POA: Diagnosis not present

## 2020-10-27 MED ORDER — TAMSULOSIN HCL 0.4 MG PO CAPS
0.4000 mg | ORAL_CAPSULE | Freq: Every day | ORAL | 0 refills | Status: DC
Start: 2020-10-27 — End: 2020-11-03

## 2020-10-27 NOTE — Patient Instructions (Signed)
  [id number]

## 2020-10-27 NOTE — Therapy (Signed)
[place], [place], [address] Phone: [phone]   Fax:  [phone]  Physical Therapy Evaluation  Patient Details  Name: Caitlin Moran MRN: [id number] Date of Birth: Sep 12, 1960 Referring Provider (PT): Evelina Bucy   Encounter Date: 10/27/2020   PT End of Session - 10/27/20 1855     Visit Number 1    Number of Visits 17    Date for PT Re-Evaluation 12/22/20    Authorization Type BAS (Private)    Authorization Time Period FOTO v6, 10    Progress Note Due on Visit 10    PT Start Time 1700    PT Stop Time 1745    PT Time Calculation (min) 45 min    Activity Tolerance Patient tolerated treatment well    Behavior During Therapy [facility] for tasks assessed/performed             Past Medical History:  Diagnosis Date   Anxiety    Arthritis    neck   Depression    GERD (gastroesophageal reflux disease)    Headache    History of kidney stones    kidney stone -lithrotrispy 04/30/18- 2 current - stable   IBS (irritable bowel syndrome)    OSA (obstructive sleep apnea)    on CPAP   Polycythemia vera (HCC)    PONV (postoperative nausea and vomiting)    Didn't go to sleep all the way  during colonoscopy.   Renal calculus, right    Right ureteral stone    Spinal stenosis     Past Surgical History:  Procedure Laterality Date   ABLATION     unquestionable   BIOPSY  05/01/2018   Procedure: BIOPSY;  Surgeon: Rush Landmark Telford Nab., MD;  Location: [place];  Service: Gastroenterology;;   BIOPSY  05/24/2020   Procedure: BIOPSY;  Surgeon: Irving Copas., MD;  Location: [facility];  Service: Gastroenterology;;   CESAREAN SECTION     CHOLECYSTECTOMY     COLONOSCOPY     COLONOSCOPY WITH PROPOFOL N/A 05/24/2020   Procedure: COLONOSCOPY WITH PROPOFOL;  Surgeon: Irving Copas., MD;  Location: [facility];  Service: Gastroenterology;  Laterality: N/A;   ESOPHAGOGASTRODUODENOSCOPY      ESOPHAGOGASTRODUODENOSCOPY N/A 05/01/2018   Procedure: ESOPHAGOGASTRODUODENOSCOPY (EGD);  Surgeon: Irving Copas., MD;  Location: [place] [facility];  Service: Gastroenterology;  Laterality: N/A;   ESOPHAGOGASTRODUODENOSCOPY (EGD) WITH PROPOFOL N/A 05/24/2020   Procedure: ESOPHAGOGASTRODUODENOSCOPY (EGD) WITH PROPOFOL;  Surgeon: Rush Landmark Telford Nab., MD;  Location: [facility];  Service: Gastroenterology;  Laterality: N/A;   EUS N/A 05/01/2018   Procedure: UPPER ENDOSCOPIC ULTRASOUND (EUS) RADIAL;  Surgeon: Irving Copas., MD;  Location: [place];  Service: Gastroenterology;  Laterality: N/A;   EXTRACORPOREAL SHOCK WAVE LITHOTRIPSY  02/14/2010   HYSTEROSCOPY WITH D & C N/A 11/27/2012   Procedure: DILATATION AND CURETTAGE /HYSTEROSCOPY;  Surgeon: Marylynn Pearson, MD;  Location: [facility];  Service: Gynecology;  Laterality: N/A;   SUBMUCOSAL LIFTING INJECTION  05/24/2020   Procedure: SUBMUCOSAL LIFTING INJECTION;  Surgeon: Rush Landmark Telford Nab., MD;  Location: [facility];  Service: Gastroenterology;;   WISDOM TOOTH EXTRACTION      There were no vitals filed for this visit.    Subjective Assessment - 10/27/20 1702     Subjective Pt reports primary c/o localized R posterior heel pain of insidious onset starting 4 months ago and worsening a month ago when packing for a move. She reports that her PCM has prescribed her a night  boot to wear, which the pt could not tolerate. He then gave her a CAM boot in May to wear when walking during the day, which helped to decrease her pain. She reports that this helped, but then, when trying to increase her walking time during the day, her pain was exacerbated again. This is what led to her receiving a referral to PT. She reports recent imaging showing calcaneal spurs on her L foot, along with a hx of BIL plantar fasciitis. She denies any N/T in her R foot, although she reports recent onset of numbness in her L foot; she adds that she was told by  her doctor that she has poor circulation due to veinous insufficiency. She also reports having varicose veins on both legs with a "vein stripping" on the R.  She reports her best pain is 1-2/10, worst pain of 7-8/10, and baseline pain of 5/10. She also reports a hx of spinal stenosis which can prohibit movement occasionally. Pt reports that her depression and anxiety in her chart are not current issues and have been resolved. Cancer and cauda equina red flag screening negative.    Pertinent History BIL plantar fasciitis    Limitations Walking;Standing;House hold activities    How long can you sit comfortably? Unlimited    How long can you stand comfortably? Unlimited    How long can you walk comfortably? 15-20 minutes    Diagnostic tests None related to current problem    Patient Stated Goals Being more active without pain    Currently in Pain? Yes    Pain Score 5     Pain Location Foot    Pain Orientation Right    Pain Descriptors / Indicators Throbbing    Pain Type Chronic pain    Pain Onset More than a month ago    Pain Frequency Intermittent    Aggravating Factors  Walking, going up stairs    Pain Relieving Factors Ice, elevation    Effect of Pain on Daily Activities Pain when doing household activities, walking                [facility] PT Assessment - 10/27/20 0001       Assessment   Medical Diagnosis Tendonitis, Achilles, right (M76.61)    Referring Provider (PT) Evelina Bucy    Onset Date/Surgical Date 06/27/20    Hand Dominance Right    Next MD Visit Podiatry at end of September    Prior Therapy Yes, for shoulder      Precautions   Precautions None      Restrictions   Weight Bearing Restrictions No      Balance Screen   Has the patient fallen in the past 6 months No    Has the patient had a decrease in activity level because of a fear of falling?  No    Is the patient reluctant to leave their home because of a fear of falling?  No      Home Environment    Living Environment Private residence    Living Arrangements Spouse/significant other    Available Help at Discharge Family    Type of [place] to enter    Entrance Stairs-Number of Steps 3    Entrance Stairs-Rails Right    Home Layout Two level    Alternate Level Stairs-Number of Steps 16    Alternate Level Stairs-Rails Right    Home Equipment None      Prior  Function   Level of Independence Independent      Cognition   Overall Cognitive Status Within Functional Limits for tasks assessed      Observation/Other Assessments   Observations BIL pes planus in stance    Focus on Therapeutic Outcomes (FOTO)  47%, expected 61% at visit 13      Functional Tests   Functional tests Squat;Single leg stance;Other;Other2      Squat   Comments WNL      Single Leg Stance   Comments 3 step errors in 15 seconds BIL      Other:   Other/ Comments DL Heel Raise: x10 with significant inversion at apex      Other:   Other/Comments SL Heel Raise: x5 on R with calcalneal pain, decreased elevation; x10 on L with decreased elevation      AROM   Right Ankle Dorsiflexion -5    Right Ankle Plantar Flexion 50    Right Ankle Inversion 35    Right Ankle Eversion 10    Left Ankle Dorsiflexion 5    Left Ankle Plantar Flexion 55    Left Ankle Inversion 40    Left Ankle Eversion 15      PROM   Right Ankle Dorsiflexion 0    Right Ankle Plantar Flexion 50    Right Ankle Inversion 40    Right Ankle Eversion 15    Left Ankle Dorsiflexion 5    Left Ankle Plantar Flexion 55    Left Ankle Inversion 40    Left Ankle Eversion 20      Strength   Right Hip Flexion 3+/5    Right Hip Extension 3/5    Right Hip ABduction 3/5    Left Hip Flexion 4/5    Left Hip Extension 3/5    Left Hip ABduction 3/5    Right Ankle Dorsiflexion 5/5    Right Ankle Plantar Flexion 4/5    Right Ankle Inversion 5/5    Right Ankle Eversion 5/5    Left Ankle Dorsiflexion 5/5    Left Ankle Plantar  Flexion 5/5    Left Ankle Inversion 5/5    Left Ankle Eversion 5/5      Flexibility   Soft Tissue Assessment /Muscle Length yes   R soleus and gastrocnemius extensibility significantly limited     Palpation   Palpation comment Mild TTP to R posterior heel at Achilles insertion                        Objective measurements completed on examination: See above findings.               PT Education - 10/27/20 1854     Education Details Pt educated about underlying physiology leading to her pain presentation, as well as proper form when performing HEP.    Person(s) Educated Patient    Methods Explanation;Demonstration;Handout    Comprehension Verbalized understanding;Returned demonstration              PT Short Term Goals - 10/27/20 1918       PT SHORT TERM GOAL #1   Title Pt will reports understanding and adherence of her HEP in order to promote independence in the management of her primary impairments.    Baseline HEP given at eval    Time 4    Period Weeks    Status New    Target Date 11/24/20  PT Long Term Goals - 10/27/20 1919       PT LONG TERM GOAL #1   Title Pt will achieve a FOTO score of 61% in order to demonstrate improved functional ability in regard to her heel pain.    Baseline 47%    Time 8    Period Weeks    Status New    Target Date 12/22/20      PT LONG TERM GOAL #2   Title Pt will demonstrate ability to perform 15 SL heel raises with good form on the R in order to demonstrate functional PF strength to stand on toes when reaching into overhead cabinets.    Baseline 5 SL heel raises on R with pain    Time 8    Period Weeks    Status New    Target Date 12/22/20      PT LONG TERM GOAL #3   Title Pt will report ability to walk >30 minutes in order to grocery shop without limitation due to pain.    Baseline Pt unable to walk longer than 15-20 minutes due to pain.    Time 8    Period Weeks    Status New     Target Date 12/22/20      PT LONG TERM GOAL #4   Title Pt will demonstrate R DF AROM =/> 5 degrees in order to clear foot during walking without limitation.    Baseline -5 degrees on R    Time 8    Period Weeks    Status New    Target Date 12/22/20      PT LONG TERM GOAL #5   Title Pt will achieve global BIL hip strength >/= 4+/5 in order to progress independent LE strengthening regimen without limitation.    Baseline global BIL hip strength ranging from 3/5 to 4/5    Time 8    Period Weeks    Status New    Target Date 12/22/20                    Plan - 10/27/20 1857     Clinical Impression Statement Pt is a pleasant 60yo F who presents with primary c/o chronic posterior R heel pain that has worsened over the past few months. Upon assessment, her primary impairments include painful and weak R PF MMT, TTP to R posterior heel, decreased BIL SL balance, decreased R ankle ROM in all planes vs L. and BIL pes planus in stance. Her sxs and subjective hx are most indicative of R Achilles tendonitis. She will benefit from skilled PT to address her primary impairments and return to her prior level of function without limitation.    Personal Factors and Comorbidities Comorbidity 3+;Fitness    Comorbidities See medical Hx    Examination-Activity Limitations Locomotion Level;Stairs    Examination-Participation Restrictions Occupation;Community Activity;Shop    Stability/Clinical Decision Making Stable/Uncomplicated    Clinical Decision Making Low    Rehab Potential Good    PT Frequency 2x / week    PT Duration 8 weeks    PT Treatment/Interventions ADLs/Self Care Home Management;Biofeedback;Cryotherapy;Electrical Stimulation;Iontophoresis '4mg'$ /ml Dexamethasone;Moist Heat;Gait training;Stair training;Functional mobility training;Therapeutic activities;Therapeutic exercise;Balance training;Neuromuscular re-education;Manual techniques;Joint Manipulations;Passive range of  motion;Patient/family education;Taping;Vasopneumatic Device    PT Next Visit Plan Progress LE strengthening program with focus on PF strength and introduce hip strengthening, progress to stair/ gait training    PT [place] and Agree with Plan of Care  Patient             Patient will benefit from skilled therapeutic intervention in order to improve the following deficits and impairments:  Abnormal gait, Decreased range of motion, Difficulty walking, Obesity, Pain, Impaired flexibility, Decreased balance, Decreased strength  Visit Diagnosis: Pain in right ankle and joints of right foot  Stiffness of right ankle, not elsewhere classified  Muscle weakness (generalized)  Unsteadiness on feet     Problem List Patient Active Problem List   Diagnosis Date Noted   DDD (degenerative disc disease), cervical 07/25/2018   Cervical radiculopathy 06/19/2018   High hematocrit without dehydration 05/09/2018   Snoring 05/09/2018   Sleep apnea in adult 05/09/2018   Excessive daytime sleepiness 05/09/2018   Elevated hemoglobin (HCC) 05/09/2018   History of colonic polyps 03/21/2018   Hemorrhoids 03/21/2018   Rectal bleeding 03/21/2018   RUQ pain 03/21/2018   GERD (gastroesophageal reflux disease) 11/06/2017   Hyperlipidemia 11/06/2017   Varicose veins of leg with pain, bilateral 11/06/2017   Left shoulder pain 01/28/2013   Proximal humerus fracture 01/28/2013    Vanessa Moscow, PT, DPT 10/27/20 7:26 PM   [facility] [address] [place], [place], [address] Phone: [phone]   Fax:  [phone]  Name: Caitlin Moran MRN: [id number] Date of Birth: 1960-12-20

## 2020-10-28 ENCOUNTER — Ambulatory Visit
Admission: RE | Admit: 2020-10-28 | Discharge: 2020-10-28 | Disposition: A | Discharge status: Home / Self Care | Payer: PRIVATE HEALTH INSURANCE | Source: Ambulatory Visit | Attending: Urology | Admitting: Urology

## 2020-10-28 DIAGNOSIS — R109 Unspecified abdominal pain: Secondary | ICD-10-CM

## 2020-10-28 LAB — URINALYSIS, COMPLETE
Bilirubin, UA: NEGATIVE
Glucose, UA: NEGATIVE
Ketones, UA: NEGATIVE
Leukocytes,UA: NEGATIVE
Nitrite, UA: NEGATIVE
Protein,UA: NEGATIVE
Specific Gravity, UA: 1.02 (ref 1.005–1.030)
Urobilinogen, Ur: 1 mg/dL (ref 0.2–1.0)
pH, UA: 7 (ref 5.0–7.5)

## 2020-10-28 LAB — MICROSCOPIC EXAMINATION: Bacteria, UA: NONE SEEN

## 2020-10-29 ENCOUNTER — Telehealth: Payer: Self-pay

## 2020-10-29 MED ORDER — SULFAMETHOXAZOLE-TRIMETHOPRIM 800-160 MG PO TABS: 1.0000 | ORAL_TABLET | Freq: Two times a day (BID) | ORAL | 0 refills | Status: DC

## 2020-10-29 NOTE — Telephone Encounter (Signed)
Sw pt she verbalizes understanding. Medication sent to [facility].

## 2020-10-29 NOTE — Telephone Encounter (Signed)
-----   Message from Hollice Espy, MD sent at 10/29/2020  7:58 AM EDT ----- Ct shows nonobstructing stones.  Prelim culture gowning E. Coli.  Lets treat with Bactrim DS bid x 7 days for now, will adjust as needed.

## 2020-10-30 LAB — CULTURE, URINE COMPREHENSIVE

## 2020-11-01 NOTE — Progress Notes (Signed)
11/04/20 8:25 AM   Caitlin Moran 06-May-1960 [id number]  Referring provider:  Deland Pretty, MD [address] [place],  [place] [address] Chief Complaint  Patient presents with   Follow-up     HPI: Caitlin Moran is a 60 y.o.female who returns today for 1 wk follow-up with CT scan results.   She was seen in office 10/27/2020 for possible kidney stone.  She had some intermittent lower and right-sided abdominal pain over the past 1-2 months.   She has a past history of kidney stones and was referred to urology in the past s/p ESWL.   Urinlaysis and urine culture on 10/27/2020 was mildly suspicious and grew E.coli, she was prescribed bactrim    10/28/2020 Ct renal stone study showed no evidence of obstructive uropathy. Nonobstructive stones of the lower pole of the right kidney, stone burden is overall decreased when compared with prior CT dated 05/25/2016.   She is doing well today with improvement of flank pain.   PMH: Past Medical History:  Diagnosis Date   Anxiety    Arthritis    neck   Depression    GERD (gastroesophageal reflux disease)    Headache    History of kidney stones    kidney stone -lithrotrispy 04/30/18- 2 current - stable   IBS (irritable bowel syndrome)    OSA (obstructive sleep apnea)    on CPAP   Polycythemia vera (HCC)    PONV (postoperative nausea and vomiting)    Didn't go to sleep all the way  during colonoscopy.   Renal calculus, right    Right ureteral stone    Spinal stenosis     Surgical History: Past Surgical History:  Procedure Laterality Date   ABLATION     unquestionable   BIOPSY  05/01/2018   Procedure: BIOPSY;  Surgeon: Rush Landmark Telford Nab., MD;  Location: [place];  Service: Gastroenterology;;   BIOPSY  05/24/2020   Procedure: BIOPSY;  Surgeon: Irving Copas., MD;  Location: [facility];  Service: Gastroenterology;;   CESAREAN SECTION     CHOLECYSTECTOMY     COLONOSCOPY     COLONOSCOPY  WITH PROPOFOL N/A 05/24/2020   Procedure: COLONOSCOPY WITH PROPOFOL;  Surgeon: Irving Copas., MD;  Location: [facility];  Service: Gastroenterology;  Laterality: N/A;   ESOPHAGOGASTRODUODENOSCOPY     ESOPHAGOGASTRODUODENOSCOPY N/A 05/01/2018   Procedure: ESOPHAGOGASTRODUODENOSCOPY (EGD);  Surgeon: Irving Copas., MD;  Location: [facility];  Service: Gastroenterology;  Laterality: N/A;   ESOPHAGOGASTRODUODENOSCOPY (EGD) WITH PROPOFOL N/A 05/24/2020   Procedure: ESOPHAGOGASTRODUODENOSCOPY (EGD) WITH PROPOFOL;  Surgeon: Rush Landmark Telford Nab., MD;  Location[facility];  Service: Gastroenterology;  Laterality: N/A;   EUS N/A 05/01/2018   Procedure: UPPER ENDOSCOPIC ULTRASOUND (EUS) RADIAL;  Surgeon: Irving Copas., MD;  Location: [facility];  Service: Gastroenterology;  Laterality: N/A;   EXTRACORPOREAL SHOCK WAVE LITHOTRIPSY  02/14/2010   HYSTEROSCOPY WITH D & C N/A 11/27/2012   Procedure: DILATATION AND CURETTAGE /HYSTEROSCOPY;  Surgeon: Marylynn Pearson, MD;  Location: [facility];  Service: Gynecology;  Laterality: N/A;   SUBMUCOSAL LIFTING INJECTION  05/24/2020   Procedure: SUBMUCOSAL LIFTING INJECTION;  Surgeon: Rush Landmark Telford Nab., MD;  Location: [facility];  Service: Gastroenterology;;   WISDOM TOOTH EXTRACTION      Home Medications:  Allergies as of 11/03/2020   No Known Allergies      Medication List        Accurate as of November 03, 2020 11:59 PM. If you have any questions,  ask your nurse or doctor.          STOP taking these medications    tamsulosin 0.4 MG Caps capsule Commonly known as: FLOMAX       TAKE these medications    aspirin EC 81 MG tablet Take 81 mg by mouth daily.   Calcium-Vitamin D 600-400 MG-UNIT Tabs Take 1 tablet by mouth daily.   celecoxib 200 MG capsule Commonly known as: CELEBREX Take 200 mg by mouth daily.   clobetasol cream 0.05 % Commonly known as: TEMOVATE Apply 1 application topically daily as  needed (for psoriasis).   fish oil-omega-3 fatty acids 1000 MG capsule Take 1 g by mouth daily.   Golimumab 50 MG/0.5ML Soaj Inject 50 mg into the skin every 30 (thirty) days.   HYDROcodone-acetaminophen 7.5-325 MG tablet Commonly known as: NORCO Take 1 tablet by mouth every 6 (six) hours as needed for moderate pain.   omeprazole 40 MG capsule Commonly known as: PRILOSEC Take 1 capsule (40 mg total) by mouth 2 (two) times daily before a meal.   PROBIOTIC PO Take 1 capsule by mouth daily.   rosuvastatin 5 MG tablet Commonly known as: CRESTOR Take 5 mg by mouth daily.   sulfamethoxazole-trimethoprim 800-160 MG tablet Commonly known as: BACTRIM DS Take 1 tablet by mouth every 12 (twelve) hours.   tiZANidine 2 MG tablet Commonly known as: ZANAFLEX Take 2 mg by mouth 3 (three) times daily as needed.   Turmeric Curcumin 500 MG Caps See admin instructions.   Vitamin D 50 MCG (2000 UT) Caps Take 2,000 Units by mouth daily.   vortioxetine HBr 20 MG Tabs tablet Commonly known as: TRINTELLIX Take 20 mg by mouth daily.   Wegovy 1 MG/0.5ML Soaj Generic drug: Semaglutide-Weight Management 1 mg        Allergies: No Known Allergies  Family History: Family History  Problem Relation Age of Onset   Breast cancer Maternal Aunt    Esophageal cancer Mother    Lung cancer Father    CAD Father    Colon cancer Neg Hx    Inflammatory bowel disease Neg Hx    Liver disease Neg Hx    Pancreatic cancer Neg Hx    Stomach cancer Neg Hx    Rectal cancer Neg Hx     Social History:  reports that she quit smoking about 30 years ago. She has never used smokeless tobacco. She reports current alcohol use of about 7.0 standard drinks per week. She reports that she does not use drugs.   Physical Exam: BP 133/86   Pulse 73   Ht 5' 6.5" (1.689 m)   Wt 210 lb (95.3 kg)   LMP 09/24/2012   BMI 33.39 kg/m   Constitutional:  Alert and oriented, No acute distress. HEENT: [place] AT, moist  mucus membranes.  Trachea midline, no masses. Cardiovascular: No clubbing, cyanosis, or edema. Respiratory: Normal respiratory effort, no increased work of breathing. Skin: No rashes, bruises or suspicious lesions. Neurologic: Grossly intact, no focal deficits, moving all 4 extremities. Psychiatric: Normal mood and affect.  Laboratory Data: Lab Results  Component Value Date   CREATININE 1.01 05/17/2020    Pertinent Imaging:  CLINICAL DATA:  Right flank pain   EXAM: CT ABDOMEN AND PELVIS WITHOUT CONTRAST   TECHNIQUE: Multidetector CT imaging of the abdomen and pelvis was performed following the standard protocol without IV contrast.   COMPARISON:  CT abdomen and pelvis dated May 25, 2016   FINDINGS: Lower chest: No acute  abnormality.   Hepatobiliary: No focal liver abnormality is seen. Status post cholecystectomy. No biliary dilatation.   Pancreas: Unremarkable   Spleen: Unremarkable   Adrenals/Urinary Tract: Bilateral adrenal glands are unremarkable. Cluster of nonobstructing stones seen in the lower pole of the right kidney, stone burden is overall decreased when compared with prior CT dated May 25, 2016. No evidence of hydronephrosis. No ureterolithiasis. Bladder is decompressed.   Stomach/Bowel: Stomach is within normal limits. Appendix appears normal. No evidence of bowel wall thickening, distention, or inflammatory changes.   Vascular/Lymphatic: Aortic atherosclerosis. No enlarged abdominal or pelvic lymph nodes.   Reproductive: Uterus and bilateral adnexa are unremarkable.   Other: No abdominal wall hernia or abnormality. No abdominopelvic ascites.   Musculoskeletal: No acute or significant osseous findings.   IMPRESSION: No evidence of obstructive uropathy.   Nonobstructing stones of the lower pole of the right kidney, stone burden is overall decreased when compared with prior CT dated May 25, 2016.   I have personally reviewed the images  and agree with radiologist interpretation.     Assessment & Plan:    Right lower pole stones  We discussed various treatment options for urolithiasis including observation with or without medical expulsive therapy, shockwave lithotripsy (SWL), ureteroscopy and laser lithotripsy with stent placement, and percutaneous nephrolithotomy.   We discussed that management is based on stone size, location, density, patient co-morbidities, and patient preference.    Stones <31m in size have a >80% spontaneous passage rate. Data surrounding the use of tamsulosin for medical expulsive therapy is controversial, but meta analyses suggests it is most efficacious for distal stones between 5-132min size. Possible side effects include dizziness/lightheadedness, and retrograde ejaculation.   SWL has a lower stone free rate in a single procedure, but also a lower complication rate compared to ureteroscopy and avoids a stent and associated stent related symptoms. Possible complications include renal hematoma, steinstrasse, and need for additional treatment. We discussed the role of his increased skin to stone distance can lead to decreased efficacy with shockwave lithotripsy.   Ureteroscopy with laser lithotripsy and stent placement has a higher stone free rate than SWL in a single procedure, however increased complication rate including possible infection, ureteral injury, bleeding, and stent related morbidity. Common stent related symptoms include dysuria, urgency/frequency, and flank pain.   After an extensive discussion of the risks and benefits of the above treatment options, the patient would like to proceed with surveillance .  Given stone stability x many years, agree with observation as reasonable approach.  Reviewed stone dietary recommendation.    2. Right flank pain  - possibly related to UTI or none GU etiology  - significantly improved on antibiotic possibly sub clinical pile nephrolithiasis.      Follow-up in 2 years for KUB   I,Conley Rollss a scribe for AsHollice EspyMD.,have documented all relevant documentation on the behalf of AsHollice EspyMD,as directed by  AsHollice EspyMD while in the presence of AsHollice EspyMD.  I have reviewed the above documentation for accuracy and completeness, and I agree with the above.   AsHollice EspyMD   [facility] [address]SuConwayuSaxman[place] [phone]

## 2020-11-02 ENCOUNTER — Other Ambulatory Visit: Payer: Self-pay

## 2020-11-02 ENCOUNTER — Ambulatory Visit: Payer: PRIVATE HEALTH INSURANCE

## 2020-11-02 DIAGNOSIS — M25671 Stiffness of right ankle, not elsewhere classified: Secondary | ICD-10-CM

## 2020-11-02 DIAGNOSIS — M25571 Pain in right ankle and joints of right foot: Secondary | ICD-10-CM | POA: Diagnosis not present

## 2020-11-02 DIAGNOSIS — R2681 Unsteadiness on feet: Secondary | ICD-10-CM

## 2020-11-02 DIAGNOSIS — M6281 Muscle weakness (generalized): Secondary | ICD-10-CM

## 2020-11-02 NOTE — Patient Instructions (Signed)
  [id number]

## 2020-11-02 NOTE — Therapy (Signed)
[place], [place], [address] Phone: [phone]   Fax:  [phone]  Physical Therapy Treatment  Patient Details  Name: Caitlin Moran MRN: [id number] Date of Birth: 1961-01-23 Referring Provider (PT): Evelina Bucy   Encounter Date: 11/02/2020   PT End of Session - 11/02/20 1915     Visit Number 2    Number of Visits 17    Date for PT Re-Evaluation 12/22/20    Authorization Type BAS (Private)    Authorization Time Period FOTO v6, 10    Progress Note Due on Visit 10    PT Start Time 1830    PT Stop Time 1915    PT Time Calculation (min) 45 min    Equipment Utilized During Treatment Gait belt    Activity Tolerance Patient tolerated treatment well    Behavior During Therapy WFL for tasks assessed/performed             Past Medical History:  Diagnosis Date   Anxiety    Arthritis    neck   Depression    GERD (gastroesophageal reflux disease)    Headache    History of kidney stones    kidney stone -lithrotrispy 04/30/18- 2 current - stable   IBS (irritable bowel syndrome)    OSA (obstructive sleep apnea)    on CPAP   Polycythemia vera (HCC)    PONV (postoperative nausea and vomiting)    Didn't go to sleep all the way  during colonoscopy.   Renal calculus, right    Right ureteral stone    Spinal stenosis     Past Surgical History:  Procedure Laterality Date   ABLATION     unquestionable   BIOPSY  05/01/2018   Procedure: BIOPSY;  Surgeon: Rush Landmark Telford Nab., MD;  Location: [facility];  Service: Gastroenterology;;   BIOPSY  05/24/2020   Procedure: BIOPSY;  Surgeon: Irving Copas., MD;  Location: [facility];  Service: Gastroenterology;;   CESAREAN SECTION     CHOLECYSTECTOMY     COLONOSCOPY     COLONOSCOPY WITH PROPOFOL N/A 05/24/2020   Procedure: COLONOSCOPY WITH PROPOFOL;  Surgeon: Irving Copas., MD;  Location: [facility];  Service: Gastroenterology;   Laterality: N/A;   ESOPHAGOGASTRODUODENOSCOPY     ESOPHAGOGASTRODUODENOSCOPY N/A 05/01/2018   Procedure: ESOPHAGOGASTRODUODENOSCOPY (EGD);  Surgeon: Irving Copas., MD;  Location: [facility];  Service: Gastroenterology;  Laterality: N/A;   ESOPHAGOGASTRODUODENOSCOPY (EGD) WITH PROPOFOL N/A 05/24/2020   Procedure: ESOPHAGOGASTRODUODENOSCOPY (EGD) WITH PROPOFOL;  Surgeon: Rush Landmark Telford Nab., MD;  Location: [facility];  Service: Gastroenterology;  Laterality: N/A;   EUS N/A 05/01/2018   Procedure: UPPER ENDOSCOPIC ULTRASOUND (EUS) RADIAL;  Surgeon: Irving Copas., MD;  Location: [facility];  Service: Gastroenterology;  Laterality: N/A;   EXTRACORPOREAL SHOCK WAVE LITHOTRIPSY  02/14/2010   HYSTEROSCOPY WITH D & C N/A 11/27/2012   Procedure: DILATATION AND CURETTAGE /HYSTEROSCOPY;  Surgeon: Marylynn Pearson, MD;  Location: [facility];  Service: Gynecology;  Laterality: N/A;   SUBMUCOSAL LIFTING INJECTION  05/24/2020   Procedure: SUBMUCOSAL LIFTING INJECTION;  Surgeon: Rush Landmark Telford Nab., MD;  Location: [facility];  Service: Gastroenterology;;   WISDOM TOOTH EXTRACTION      There were no vitals filed for this visit.   Subjective Assessment - 11/02/20 1835     Subjective Pt reports improved pain since her initial eval and adds that she has been adherent to her HEP, although she is unsure if she is using the right  form with the eccentric heel raise exercise.    Currently in Pain? Yes    Pain Score 4     Pain Location Heel    Pain Orientation Right    Pain Descriptors / Indicators Aching    Pain Type Chronic pain                               [facility] Adult PT Treatment/Exercise - 11/02/20 0001       Ankle Exercises: Stretches   Gastroc Stretch 60 seconds   Standing with R foot on wall     Ankle Exercises: Machines for Strengthening   Cybex Leg Press Leg press with alternating hip flexion/ DF with RTB around forefeet 2x16      Ankle Exercises:  Standing   SLS 3x30sec BIL on airex pad in // bars    Heel Raises Other (comment)   Eccentric heel raises on 2" step (2 up, 1 down) 3x10   Other Standing Ankle Exercises Mini squat side steps with RTB around ankles 2x2 laps in // bars    Other Standing Ankle Exercises Mini forward lunge onto Bosu ball in // bars 2x10 BIL                    PT Education - 11/02/20 1915     Education Details Pt educated on proper form with new exercises.    Person(s) Educated Patient    Methods Explanation;Demonstration;Handout    Comprehension Verbalized understanding;Returned demonstration              PT Short Term Goals - 10/27/20 1918       PT SHORT TERM GOAL #1   Title Pt will reports understanding and adherence of her HEP in order to promote independence in the management of her primary impairments.    Baseline HEP given at eval    Time 4    Period Weeks    Status New    Target Date 11/24/20               PT Long Term Goals - 10/27/20 1919       PT LONG TERM GOAL #1   Title Pt will achieve a FOTO score of 61% in order to demonstrate improved functional ability in regard to her heel pain.    Baseline 47%    Time 8    Period Weeks    Status New    Target Date 12/22/20      PT LONG TERM GOAL #2   Title Pt will demonstrate ability to perform 15 SL heel raises with good form on the R in order to demonstrate functional PF strength to stand on toes when reaching into overhead cabinets.    Baseline 5 SL heel raises on R with pain    Time 8    Period Weeks    Status New    Target Date 12/22/20      PT LONG TERM GOAL #3   Title Pt will report ability to walk >30 minutes in order to grocery shop without limitation due to pain.    Baseline Pt unable to walk longer than 15-20 minutes due to pain.    Time 8    Period Weeks    Status New    Target Date 12/22/20      PT LONG TERM GOAL #4   Title Pt will demonstrate R DF AROM =/> 5 degrees  in order to clear foot during  walking without limitation.    Baseline -5 degrees on R    Time 8    Period Weeks    Status New    Target Date 12/22/20      PT LONG TERM GOAL #5   Title Pt will achieve global BIL hip strength >/= 4+/5 in order to progress independent LE strengthening regimen without limitation.    Baseline global BIL hip strength ranging from 3/5 to 4/5    Time 8    Period Weeks    Status New    Target Date 12/22/20                   Plan - 11/02/20 1917     Clinical Impression Statement Pt responded well to all interventions today, demonstrating proper form and no increase in pain with all selected exercises. She reports that the exercises provided a good challenge today. She will continue to benefit from skilled PT to address her primary impairments and return to her prior level of function without limitation.    Personal Factors and Comorbidities Comorbidity 3+;Fitness    Comorbidities See medical Hx    Examination-Activity Limitations Locomotion Level;Stairs    Examination-Participation Restrictions Occupation;Community Activity;Shop    Stability/Clinical Decision Making Stable/Uncomplicated    Clinical Decision Making Low    Rehab Potential Good    PT Frequency 2x / week    PT Duration 8 weeks    PT Treatment/Interventions ADLs/Self Care Home Management;Biofeedback;Cryotherapy;Electrical Stimulation;Iontophoresis '4mg'$ /ml Dexamethasone;Moist Heat;Gait training;Stair training;Functional mobility training;Therapeutic activities;Therapeutic exercise;Balance training;Neuromuscular re-education;Manual techniques;Joint Manipulations;Passive range of motion;Patient/family education;Taping;Vasopneumatic Device    PT Next Visit Plan Progress LE strengthening program with focus on PF strength and introduce hip strengthening, progress to stair/ gait training    PT Home Exercise Plan [id number]    Consulted and Agree with Plan of Care Patient             Patient will benefit from skilled  therapeutic intervention in order to improve the following deficits and impairments:  Abnormal gait, Decreased range of motion, Difficulty walking, Obesity, Pain, Impaired flexibility, Decreased balance, Decreased strength  Visit Diagnosis: Pain in right ankle and joints of right foot  Stiffness of right ankle, not elsewhere classified  Muscle weakness (generalized)  Unsteadiness on feet     Problem List Patient Active Problem List   Diagnosis Date Noted   DDD (degenerative disc disease), cervical 07/25/2018   Cervical radiculopathy 06/19/2018   High hematocrit without dehydration 05/09/2018   Snoring 05/09/2018   Sleep apnea in adult 05/09/2018   Excessive daytime sleepiness 05/09/2018   Elevated hemoglobin ([place]) 05/09/2018   History of colonic polyps 03/21/2018   Hemorrhoids 03/21/2018   Rectal bleeding 03/21/2018   RUQ pain 03/21/2018   GERD (gastroesophageal reflux disease) 11/06/2017   Hyperlipidemia 11/06/2017   Varicose veins of leg with pain, bilateral 11/06/2017   Left shoulder pain 01/28/2013   Proximal humerus fracture 01/28/2013    Vanessa [place], PT, DPT 11/02/20 7:20 PM   [facility] [address] [place], [place], [address] Phone: [phone]   Fax:  [phone]  Name: Caitlin Moran MRN: [id number] Date of Birth: 12/09/1960

## 2020-11-03 ENCOUNTER — Ambulatory Visit (INDEPENDENT_AMBULATORY_CARE_PROVIDER_SITE_OTHER): Payer: PRIVATE HEALTH INSURANCE | Admitting: Urology

## 2020-11-03 VITALS — BP 133/86 | HR 73 | Ht 66.5 in | Wt 210.0 lb

## 2020-11-03 DIAGNOSIS — R109 Unspecified abdominal pain: Secondary | ICD-10-CM | POA: Diagnosis not present

## 2020-11-03 DIAGNOSIS — N200 Calculus of kidney: Secondary | ICD-10-CM | POA: Diagnosis not present

## 2020-11-04 ENCOUNTER — Other Ambulatory Visit: Payer: Self-pay

## 2020-11-04 ENCOUNTER — Ambulatory Visit: Payer: PRIVATE HEALTH INSURANCE

## 2020-11-04 DIAGNOSIS — M6281 Muscle weakness (generalized): Secondary | ICD-10-CM

## 2020-11-04 DIAGNOSIS — M25671 Stiffness of right ankle, not elsewhere classified: Secondary | ICD-10-CM

## 2020-11-04 DIAGNOSIS — M25571 Pain in right ankle and joints of right foot: Secondary | ICD-10-CM | POA: Diagnosis not present

## 2020-11-04 DIAGNOSIS — R2681 Unsteadiness on feet: Secondary | ICD-10-CM

## 2020-11-04 NOTE — Therapy (Signed)
Caitlin Moran, [place], [address] Phone: [phone]   Fax:  [phone]  Physical Therapy Treatment  Patient Details  Name: Caitlin Moran MRN: [id number] Date of Birth: 1960-09-20 Referring Provider (PT): Evelina Bucy   Encounter Date: 11/04/2020   PT End of Session - 11/04/20 1826     Visit Number 3    Number of Visits 17    Date for PT Re-Evaluation 12/22/20    Authorization Type BAS (Private)    Authorization Time Period FOTO v6, 10    Progress Note Due on Visit 10    PT Start Time 1800   Pt arrived 15 minutes late to her appointment.   PT Stop Time 1830    PT Time Calculation (min) 30 min    Activity Tolerance Patient tolerated treatment well    Behavior During Therapy WFL for tasks assessed/performed             Past Medical History:  Diagnosis Date   Anxiety    Arthritis    neck   Depression    GERD (gastroesophageal reflux disease)    Headache    History of kidney stones    kidney stone -lithrotrispy 04/30/18- 2 current - stable   IBS (irritable bowel syndrome)    OSA (obstructive sleep apnea)    on CPAP   Polycythemia vera (HCC)    PONV (postoperative nausea and vomiting)    Didn't go to sleep all the way  during colonoscopy.   Renal calculus, right    Right ureteral stone    Spinal stenosis     Past Surgical History:  Procedure Laterality Date   ABLATION     unquestionable   BIOPSY  05/01/2018   Procedure: BIOPSY;  Surgeon: Rush Landmark Telford Nab., MD;  Location: [facility];  Service: Gastroenterology;;   BIOPSY  05/24/2020   Procedure: BIOPSY;  Surgeon: Irving Copas., MD;  Location: [facility];  Service: Gastroenterology;;   CESAREAN SECTION     CHOLECYSTECTOMY     COLONOSCOPY     COLONOSCOPY WITH PROPOFOL N/A 05/24/2020   Procedure: COLONOSCOPY WITH PROPOFOL;  Surgeon: Irving Copas., MD;  Location: [facility];  Service: Gastroenterology;   Laterality: N/A;   ESOPHAGOGASTRODUODENOSCOPY     ESOPHAGOGASTRODUODENOSCOPY N/A 05/01/2018   Procedure: ESOPHAGOGASTRODUODENOSCOPY (EGD);  Surgeon: Irving Copas., MD;  Location: [facility];  Service: Gastroenterology;  Laterality: N/A;   ESOPHAGOGASTRODUODENOSCOPY (EGD) WITH PROPOFOL N/A 05/24/2020   Procedure: ESOPHAGOGASTRODUODENOSCOPY (EGD) WITH PROPOFOL;  Surgeon: Rush Landmark Telford Nab., MD;  Location: [facility];  Service: Gastroenterology;  Laterality: N/A;   EUS N/A 05/01/2018   Procedure: UPPER ENDOSCOPIC ULTRASOUND (EUS) RADIAL;  Surgeon: Irving Copas., MD;  Location: [facility];  Service: Gastroenterology;  Laterality: N/A;   EXTRACORPOREAL SHOCK WAVE LITHOTRIPSY  02/14/2010   HYSTEROSCOPY WITH D & C N/A 11/27/2012   Procedure: DILATATION AND CURETTAGE /HYSTEROSCOPY;  Surgeon: Marylynn Pearson, MD;  Location: [facility];  Service: Gynecology;  Laterality: N/A;   SUBMUCOSAL LIFTING INJECTION  05/24/2020   Procedure: SUBMUCOSAL LIFTING INJECTION;  Surgeon: Rush Landmark Telford Nab., MD;  Location: [facility];  Service: Gastroenterology;;   WISDOM TOOTH EXTRACTION      There were no vitals filed for this visit.   Subjective Assessment - 11/04/20 1759     Subjective Pt reports that her R foot/ heel pain has continued to improve, although her L foot has been given her pain recently. She reports being consistent with her  HEP.    Currently in Pain? Yes    Pain Score 3     Pain Location Heel    Pain Orientation Right    Pain Descriptors / Indicators Aching    Pain Type Chronic pain                               [facility] Adult PT Treatment/Exercise - 11/04/20 0001       Manual Therapy   Manual Therapy Taping    McConnell Hammock strap with Achilles' support on L with Rock tape      Ankle Exercises: Machines for Strengthening   Cybex Leg Press Bouncing heel raises 3x30      Ankle Exercises: Standing   Other Standing Ankle Exercises SL dead  lift with 5# KB 2x10 BIL    Other Standing Ankle Exercises Step up on 8" step with knee drive [id number] BIL      Ankle Exercises: Seated   Heel Raises Other (comment)   Eccentric heel raise on Cybex Leg press (2up, 1 down) with 80# 3x10     Ankle Exercises: Stretches   Gastroc Stretch 60 seconds   Slant board                   PT Education - 11/04/20 1823     Education Details Pt educated on proper form with new exercises    Person(s) Educated Patient    Methods Explanation;Demonstration;Handout    Comprehension Verbalized understanding;Returned demonstration              PT Short Term Goals - 11/04/20 1830       PT SHORT TERM GOAL #1   Title Pt will reports understanding and adherence of her HEP in order to promote independence in the management of her primary impairments.    Baseline Pt reports adherence to HEP.    Time 4    Period Weeks    Status Achieved    Target Date 11/24/20               PT Long Term Goals - 10/27/20 1919       PT LONG TERM GOAL #1   Title Pt will achieve a FOTO score of 61% in order to demonstrate improved functional ability in regard to her heel pain.    Baseline 47%    Time 8    Period Weeks    Status New    Target Date 12/22/20      PT LONG TERM GOAL #2   Title Pt will demonstrate ability to perform 15 SL heel raises with good form on the R in order to demonstrate functional PF strength to stand on toes when reaching into overhead cabinets.    Baseline 5 SL heel raises on R with pain    Time 8    Period Weeks    Status New    Target Date 12/22/20      PT LONG TERM GOAL #3   Title Pt will report ability to walk >30 minutes in order to grocery shop without limitation due to pain.    Baseline Pt unable to walk longer than 15-20 minutes due to pain.    Time 8    Period Weeks    Status New    Target Date 12/22/20      PT LONG TERM GOAL #4   Title Pt will demonstrate R DF AROM =/> 5 degrees  in order to clear foot during  walking without limitation.    Baseline -5 degrees on R    Time 8    Period Weeks    Status New    Target Date 12/22/20      PT LONG TERM GOAL #5   Title Pt will achieve global BIL hip strength >/= 4+/5 in order to progress independent LE strengthening regimen without limitation.    Baseline global BIL hip strength ranging from 3/5 to 4/5    Time 8    Period Weeks    Status New    Target Date 12/22/20                   Plan - 11/04/20 1828     Clinical Impression Statement Pt arrived 15 minutes late to her appointment, which resulted in a truncated session. She responded well to all interventions today, demonstrating proper form and no increase in pain with all selected exercises. She reports that the exercises provided a good challenge today. She also reports a therapeutic effect from the hammock strap/ Achilles' assistance Rock taping. She was provided education regarding side effects to watch for with taping and techniques to remove the tape if needed. She will continue to benefit from skilled PT to address her primary impairments and return to her prior level of function without limitation.    Personal Factors and Comorbidities Comorbidity 3+;Fitness    Comorbidities See medical Hx    Examination-Activity Limitations Locomotion Level;Stairs    Examination-Participation Restrictions Occupation;Community Activity;Shop    Stability/Clinical Decision Making Stable/Uncomplicated    Clinical Decision Making Low    Rehab Potential Good    PT Frequency 2x / week    PT Duration 8 weeks    PT Treatment/Interventions ADLs/Self Care Home Management;Biofeedback;Cryotherapy;Electrical Stimulation;Iontophoresis '4mg'$ /ml Dexamethasone;Moist Heat;Gait training;Stair training;Functional mobility training;Therapeutic activities;Therapeutic exercise;Balance training;Neuromuscular re-education;Manual techniques;Joint Manipulations;Passive range of motion;Patient/family  education;Taping;Vasopneumatic Device    PT Next Visit Plan Progress LE strengthening program with focus on PF strength and introduce hip strengthening, progress to stair/ gait training    PT Home Exercise Plan [id number]    Consulted and Agree with Plan of Care Patient             Patient will benefit from skilled therapeutic intervention in order to improve the following deficits and impairments:  Abnormal gait, Decreased range of motion, Difficulty walking, Obesity, Pain, Impaired flexibility, Decreased balance, Decreased strength  Visit Diagnosis: Pain in right ankle and joints of right foot  Stiffness of right ankle, not elsewhere classified  Muscle weakness (generalized)  Unsteadiness on feet     Problem List Patient Active Problem List   Diagnosis Date Noted   DDD (degenerative disc disease), cervical 07/25/2018   Cervical radiculopathy 06/19/2018   High hematocrit without dehydration 05/09/2018   Snoring 05/09/2018   Sleep apnea in adult 05/09/2018   Excessive daytime sleepiness 05/09/2018   Elevated hemoglobin ([place]) 05/09/2018   History of colonic polyps 03/21/2018   Hemorrhoids 03/21/2018   Rectal bleeding 03/21/2018   RUQ pain 03/21/2018   GERD (gastroesophageal reflux disease) 11/06/2017   Hyperlipidemia 11/06/2017   Varicose veins of leg with pain, bilateral 11/06/2017   Left shoulder pain 01/28/2013   Proximal humerus fracture 01/28/2013    Vanessa Burt, PT, DPT 11/04/20 6:32 PM   [facility] [address] [place], [place], [address] Phone: [phone]   Fax:  [phone]  Name: Shaunda Locklin MRN: [id number] Date of Birth: [birth year]

## 2020-11-04 NOTE — Patient Instructions (Signed)
  [id number]

## 2020-11-09 ENCOUNTER — Other Ambulatory Visit: Payer: Self-pay

## 2020-11-09 ENCOUNTER — Telehealth: Payer: Self-pay | Admitting: *Deleted

## 2020-11-09 ENCOUNTER — Ambulatory Visit: Payer: PRIVATE HEALTH INSURANCE

## 2020-11-09 DIAGNOSIS — M25571 Pain in right ankle and joints of right foot: Secondary | ICD-10-CM | POA: Diagnosis not present

## 2020-11-09 DIAGNOSIS — R2681 Unsteadiness on feet: Secondary | ICD-10-CM

## 2020-11-09 DIAGNOSIS — M6281 Muscle weakness (generalized): Secondary | ICD-10-CM

## 2020-11-09 DIAGNOSIS — M25671 Stiffness of right ankle, not elsewhere classified: Secondary | ICD-10-CM

## 2020-11-09 NOTE — Telephone Encounter (Signed)
Pt left VM stating that she is having problems with the veins in her leg and is requesting an appt. I have called her twice with no answer and left VMs.

## 2020-11-09 NOTE — Therapy (Signed)
[place], [place], [address] Phone: [phone]   Fax:  [phone]  Physical Therapy Treatment  Patient Details  Name: Caitlin Moran MRN: [id number] Date of Birth: [birth year] Referring Provider (PT): Evelina Bucy   Encounter Date: 11/09/2020   PT End of Session - 11/09/20 1824     Visit Number 4    Number of Visits 17    Date for PT Re-Evaluation 12/22/20    Authorization Type BAS (Private)    Authorization Time Period FOTO v6, 10    Progress Note Due on Visit 10    PT Start Time 1745    PT Stop Time 1840   10 minutes of GameReady at end of session   PT Time Calculation (min) 55 min    Activity Tolerance Patient tolerated treatment well    Behavior During Therapy [facility] for tasks assessed/performed             Past Medical History:  Diagnosis Date   Anxiety    Arthritis    neck   Depression    GERD (gastroesophageal reflux disease)    Headache    History of kidney stones    kidney stone -lithrotrispy 04/30/18- 2 current - stable   IBS (irritable bowel syndrome)    OSA (obstructive sleep apnea)    on CPAP   Polycythemia vera (HCC)    PONV (postoperative nausea and vomiting)    Didn't go to sleep all the way  during colonoscopy.   Renal calculus, right    Right ureteral stone    Spinal stenosis     Past Surgical History:  Procedure Laterality Date   ABLATION     unquestionable   BIOPSY  05/01/2018   Procedure: BIOPSY;  Surgeon: Rush Landmark Telford Nab., MD;  Location: [facility];  Service: Gastroenterology;;   BIOPSY  05/24/2020   Procedure: BIOPSY;  Surgeon: Irving Copas., MD;  Location: [facility];  Service: Gastroenterology;;   CESAREAN SECTION     CHOLECYSTECTOMY     COLONOSCOPY     COLONOSCOPY WITH PROPOFOL N/A 05/24/2020   Procedure: COLONOSCOPY WITH PROPOFOL;  Surgeon: Irving Copas., MD;  Location: [facility];  Service: Gastroenterology;   Laterality: N/A;   ESOPHAGOGASTRODUODENOSCOPY     ESOPHAGOGASTRODUODENOSCOPY N/A 05/01/2018   Procedure: ESOPHAGOGASTRODUODENOSCOPY (EGD);  Surgeon: Irving Copas., MD;  Location: [facility];  Service: Gastroenterology;  Laterality: N/A;   ESOPHAGOGASTRODUODENOSCOPY (EGD) WITH PROPOFOL N/A 05/24/2020   Procedure: ESOPHAGOGASTRODUODENOSCOPY (EGD) WITH PROPOFOL;  Surgeon: Rush Landmark Telford Nab., MD;  Location: [facility];  Service: Gastroenterology;  Laterality: N/A;   EUS N/A 05/01/2018   Procedure: UPPER ENDOSCOPIC ULTRASOUND (EUS) RADIAL;  Surgeon: Irving Copas., MD;  Location: [facility];  Service: Gastroenterology;  Laterality: N/A;   EXTRACORPOREAL SHOCK WAVE LITHOTRIPSY  02/14/2010   HYSTEROSCOPY WITH D & C N/A 11/27/2012   Procedure: DILATATION AND CURETTAGE /HYSTEROSCOPY;  Surgeon: Marylynn Pearson, MD;  Location: [facility];  Service: Gynecology;  Laterality: N/A;   SUBMUCOSAL LIFTING INJECTION  05/24/2020   Procedure: SUBMUCOSAL LIFTING INJECTION;  Surgeon: Rush Landmark Telford Nab., MD;  Location: [facility];  Service: Gastroenterology;;   WISDOM TOOTH EXTRACTION      There were no vitals filed for this visit.   Subjective Assessment - 11/09/20 1743     Subjective Pt reports that her R heel pain has continued to improve, although the top of her L foot continues to bother her. She thinks it is vascular/ neural  pain and has contacted her vascular doctor to discuss this issue. Pt reports that her HEP has been going well.    Currently in Pain? Yes    Pain Score 4     Pain Location Heel    Pain Orientation Right    Pain Descriptors / Indicators Aching    Pain Type Chronic pain    Pain Onset More than a month ago    Pain Frequency Intermittent                               [facility] Adult PT Treatment/Exercise - 11/09/20 0001       Modalities   Modalities Vasopneumatic      Vasopneumatic   Number Minutes Vasopneumatic  10 minutes     Vasopnuematic Location  Ankle   R   Vasopneumatic Pressure Medium    Vasopneumatic Temperature  34      Manual Therapy   Manual Therapy Taping    McConnell Hammock strap with Achilles' support on L with Rock tape      Ankle Exercises: Machines for Strengthening   Cybex Leg Press Bouncing heel raises 3x30, 60#      Ankle Exercises: Seated   Heel Raises Other (comment)   Eccentric heel raise on Cybex Leg press (2up, 1 down) with 80# 3x10     Ankle Exercises: Plyometrics   Bilateral Jumping --   toe hopping 2x20     Ankle Exercises: Stretches   Gastroc Stretch 60 seconds;2 reps   Slant board     Ankle Exercises: Supine   Other Supine Ankle Exercises Bridge walkouts 2x10      Ankle Exercises: Standing   SLS 3x30sec BIL on airex pad in // bars    Toe Walk (Round Trip) 3x50f    Other Standing Ankle Exercises Green cord side stepping 3x336f                     PT Short Term Goals - 11/04/20 1830       PT SHORT TERM GOAL #1   Title Pt will reports understanding and adherence of her HEP in order to promote independence in the management of her primary impairments.    Baseline Pt reports adherence to HEP.    Time 4    Period Weeks    Status Achieved    Target Date 11/24/20               PT Long Term Goals - 10/27/20 1919       PT LONG TERM GOAL #1   Title Pt will achieve a FOTO score of 61% in order to demonstrate improved functional ability in regard to her heel pain.    Baseline 47%    Time 8    Period Weeks    Status New    Target Date 12/22/20      PT LONG TERM GOAL #2   Title Pt will demonstrate ability to perform 15 SL heel raises with good form on the R in order to demonstrate functional PF strength to stand on toes when reaching into overhead cabinets.    Baseline 5 SL heel raises on R with pain    Time 8    Period Weeks    Status New    Target Date 12/22/20      PT LONG TERM GOAL #3   Title Pt will report ability to walk >30  minutes in  order to grocery shop without limitation due to pain.    Baseline Pt unable to walk longer than 15-20 minutes due to pain.    Time 8    Period Weeks    Status New    Target Date 12/22/20      PT LONG TERM GOAL #4   Title Pt will demonstrate R DF AROM =/> 5 degrees in order to clear foot during walking without limitation.    Baseline -5 degrees on R    Time 8    Period Weeks    Status New    Target Date 12/22/20      PT LONG TERM GOAL #5   Title Pt will achieve global BIL hip strength >/= 4+/5 in order to progress independent LE strengthening regimen without limitation.    Baseline global BIL hip strength ranging from 3/5 to 4/5    Time 8    Period Weeks    Status New    Target Date 12/22/20                   Plan - 11/09/20 1825     Clinical Impression Statement Pt responded well to all interventions today, demonstrating proper form and no increase in pain with all selected exercises. She reports that the exercises provided a good challenge today. She also reports a therapeutic effect from the hammock strap/ Achilles' assistance Rock taping. She was provided education regarding side effects to watch for with taping and techniques to remove the tape if needed. She will continue to benefit from skilled PT to address her primary impairments and return to her prior level of function without limitation.    Personal Factors and Comorbidities Comorbidity 3+;Fitness    Comorbidities See medical Hx    Examination-Activity Limitations Locomotion Level;Stairs    Examination-Participation Restrictions Occupation;Community Activity;Shop    Stability/Clinical Decision Making Stable/Uncomplicated    Clinical Decision Making Low    Rehab Potential Good    PT Frequency 2x / week    PT Duration 8 weeks    PT Treatment/Interventions ADLs/Self Care Home Management;Biofeedback;Cryotherapy;Electrical Stimulation;Iontophoresis '4mg'$ /ml Dexamethasone;Moist Heat;Gait training;Stair  training;Functional mobility training;Therapeutic activities;Therapeutic exercise;Balance training;Neuromuscular re-education;Manual techniques;Joint Manipulations;Passive range of motion;Patient/family education;Taping;Vasopneumatic Device    PT Next Visit Plan Progress LE strengthening program with focus on PF strength and introduce hip strengthening, progress to stair/ gait training    PT Home Exercise Plan [email or web address]    Consulted and Agree with Plan of Care Patient             Patient will benefit from skilled therapeutic intervention in order to improve the following deficits and impairments:  Abnormal gait, Decreased range of motion, Difficulty walking, Obesity, Pain, Impaired flexibility, Decreased balance, Decreased strength  Visit Diagnosis: Pain in right ankle and joints of right foot  Stiffness of right ankle, not elsewhere classified  Muscle weakness (generalized)  Unsteadiness on feet     Problem List Patient Active Problem List   Diagnosis Date Noted   DDD (degenerative disc disease), cervical 07/25/2018   Cervical radiculopathy 06/19/2018   High hematocrit without dehydration 05/09/2018   Snoring 05/09/2018   Sleep apnea in adult 05/09/2018   Excessive daytime sleepiness 05/09/2018   Elevated hemoglobin ([place]) 05/09/2018   History of colonic polyps 03/21/2018   Hemorrhoids 03/21/2018   Rectal bleeding 03/21/2018   RUQ pain 03/21/2018   GERD (gastroesophageal reflux disease) 11/06/2017   Hyperlipidemia 11/06/2017   Varicose veins of leg with pain, bilateral 11/06/2017  Left shoulder pain 01/28/2013   Proximal humerus fracture 01/28/2013    Vanessa [place], PT, DPT 11/09/20 6:28 PM   [facility] [address] [place], [place], [address] Phone: [phone]   Fax:  [phone]  Name: Caitlin Moran MRN: [id number] Date of Birth: 24-Sep-1960

## 2020-11-11 ENCOUNTER — Ambulatory Visit: Payer: PRIVATE HEALTH INSURANCE

## 2020-11-25 ENCOUNTER — Telehealth: Payer: Self-pay

## 2020-11-25 NOTE — Telephone Encounter (Signed)
Patient left a message on the triage line stating that she is experiencing intense episodes of Right Flank pain (stabbing shooting pain) She states she has no urinary symptoms. At her last visit with Dr. Erlene Quan she was given abx for a positive culture and symptoms resolved but they have returned.  Attempted to return call, no answer detailed vmail left per DPR asking for patient to call back and make an apt with PA for further evaluation possible KUB, hx of stones

## 2020-11-26 ENCOUNTER — Other Ambulatory Visit: Payer: Self-pay

## 2020-11-26 ENCOUNTER — Ambulatory Visit (INDEPENDENT_AMBULATORY_CARE_PROVIDER_SITE_OTHER): Payer: PRIVATE HEALTH INSURANCE | Admitting: Podiatry

## 2020-11-26 ENCOUNTER — Other Ambulatory Visit: Payer: Self-pay | Admitting: Podiatry

## 2020-11-26 ENCOUNTER — Ambulatory Visit (INDEPENDENT_AMBULATORY_CARE_PROVIDER_SITE_OTHER): Payer: PRIVATE HEALTH INSURANCE

## 2020-11-26 DIAGNOSIS — M216X9 Other acquired deformities of unspecified foot: Secondary | ICD-10-CM | POA: Diagnosis not present

## 2020-11-26 DIAGNOSIS — M7661 Achilles tendinitis, right leg: Secondary | ICD-10-CM | POA: Diagnosis not present

## 2020-11-26 DIAGNOSIS — M79672 Pain in left foot: Secondary | ICD-10-CM

## 2020-11-26 DIAGNOSIS — G5762 Lesion of plantar nerve, left lower limb: Secondary | ICD-10-CM

## 2020-11-26 DIAGNOSIS — M79671 Pain in right foot: Secondary | ICD-10-CM

## 2020-11-26 DIAGNOSIS — L923 Foreign body granuloma of the skin and subcutaneous tissue: Secondary | ICD-10-CM | POA: Diagnosis not present

## 2020-11-26 DIAGNOSIS — I83813 Varicose veins of bilateral lower extremities with pain: Secondary | ICD-10-CM

## 2020-11-26 MED ORDER — METHYLPREDNISOLONE 4 MG PO TBPK: ORAL_TABLET | ORAL | 0 refills | Status: DC

## 2020-11-26 NOTE — Progress Notes (Signed)
  Subjective:  Patient ID: Caitlin Moran, female    DOB: [birth year],  MRN: [id number]  Chief Complaint  Patient presents with   Foot Pain    Left dorsal foot pain.  Right plantar heel foreign object, 1 week duration.   60 y.o. female presents with the above complaint. History confirmed with patient.  States she experienced a foreign body to the right foot that she states she got while [place] the [place].  Objective:  Physical Exam: warm, good capillary refill, no trophic changes or ulcerative lesions, normal DP and PT pulses and normal sensory exam. Right Foot:  POP posterior heel with prominent haglund deformity, no pain at watershed region, decreased ankle joint ROM . Visible foreign body plantar lateral right foot.  Assessment:   1. Pain in both feet   2. Tendonitis, Achilles, right   3. Equinus deformity of foot   4. Foreign body granuloma of skin and subcutaneous tissue    Plan:  Patient was evaluated and treated and all questions answered.  Achilles Tendonitis -Continues cam boot combined with rest and icing  -Rx Medrol pack -Continue voltaren gel  -Continue PT and iontophoresis -Should pain persist would consider microdebridement of the tendon to promote healing   Foreign body right heel -X-ray reviewed radiolucent foreign body noted.  -Lesion debrided with a 15 blade - a small object likely representing a seashell was removed. The area was thoroughly cleansed.  The  Return in about 1 month (around 12/26/2020) for Tendonitis f/u, possible surgical planning.

## 2020-11-29 NOTE — Telephone Encounter (Signed)
Pt called office and made appt w/Sam Friday 9/23 @ 3:30

## 2020-11-30 ENCOUNTER — Ambulatory Visit: Payer: PRIVATE HEALTH INSURANCE | Attending: Podiatry

## 2020-11-30 ENCOUNTER — Other Ambulatory Visit: Payer: Self-pay

## 2020-11-30 ENCOUNTER — Other Ambulatory Visit: Payer: Self-pay | Admitting: *Deleted

## 2020-11-30 DIAGNOSIS — M25671 Stiffness of right ankle, not elsewhere classified: Secondary | ICD-10-CM | POA: Diagnosis present

## 2020-11-30 DIAGNOSIS — M25571 Pain in right ankle and joints of right foot: Secondary | ICD-10-CM

## 2020-11-30 DIAGNOSIS — N200 Calculus of kidney: Secondary | ICD-10-CM

## 2020-11-30 DIAGNOSIS — M6281 Muscle weakness (generalized): Secondary | ICD-10-CM | POA: Insufficient documentation

## 2020-11-30 DIAGNOSIS — R2681 Unsteadiness on feet: Secondary | ICD-10-CM | POA: Insufficient documentation

## 2020-11-30 NOTE — Patient Instructions (Signed)

## 2020-11-30 NOTE — Therapy (Signed)
[place], [place], [address] Phone: [phone]   Fax:  [phone]  Physical Therapy Treatment  Patient Details  Name: Caitlin Moran MRN: [id number] Date of Birth: 11-10-1960 Referring Provider (PT): Evelina Bucy   Encounter Date: 11/30/2020   PT End of Session - 11/30/20 1814     Visit Number 5    Number of Visits 17    Date for PT Re-Evaluation 12/22/20    Authorization Type BAS (Private)    Authorization Time Period FOTO v6, 10    Progress Note Due on Visit 10    PT Start Time 1752   pt arrived 7 minutes late to her appointment   PT Stop Time 1830    PT Time Calculation (min) 38 min    Activity Tolerance Patient tolerated treatment well    Behavior During Therapy [facility] for tasks assessed/performed             Past Medical History:  Diagnosis Date   Anxiety    Arthritis    neck   Depression    GERD (gastroesophageal reflux disease)    Headache    History of kidney stones    kidney stone -lithrotrispy 04/30/18- 2 current - stable   IBS (irritable bowel syndrome)    OSA (obstructive sleep apnea)    on CPAP   Polycythemia vera (HCC)    PONV (postoperative nausea and vomiting)    Didn't go to sleep all the way  during colonoscopy.   Renal calculus, right    Right ureteral stone    Spinal stenosis     Past Surgical History:  Procedure Laterality Date   ABLATION     unquestionable   BIOPSY  05/01/2018   Procedure: BIOPSY;  Surgeon: Rush Landmark Telford Nab., MD;  Location: [facility];  Service: Gastroenterology;;   BIOPSY  05/24/2020   Procedure: BIOPSY;  Surgeon: Irving Copas., MD;  Location: [facility];  Service: Gastroenterology;;   CESAREAN SECTION     CHOLECYSTECTOMY     COLONOSCOPY     COLONOSCOPY WITH PROPOFOL N/A 05/24/2020   Procedure: COLONOSCOPY WITH PROPOFOL;  Surgeon: Irving Copas., MD;  Location: [facility];  Service: Gastroenterology;   Laterality: N/A;   ESOPHAGOGASTRODUODENOSCOPY     ESOPHAGOGASTRODUODENOSCOPY N/A 05/01/2018   Procedure: ESOPHAGOGASTRODUODENOSCOPY (EGD);  Surgeon: Irving Copas., MD;  Location: [facility];  Service: Gastroenterology;  Laterality: N/A;   ESOPHAGOGASTRODUODENOSCOPY (EGD) WITH PROPOFOL N/A 05/24/2020   Procedure: ESOPHAGOGASTRODUODENOSCOPY (EGD) WITH PROPOFOL;  Surgeon: Rush Landmark Telford Nab., MD;  Location: [facility];  Service: Gastroenterology;  Laterality: N/A;   EUS N/A 05/01/2018   Procedure: UPPER ENDOSCOPIC ULTRASOUND (EUS) RADIAL;  Surgeon: Irving Copas., MD;  Location: [facility];  Service: Gastroenterology;  Laterality: N/A;   EXTRACORPOREAL SHOCK WAVE LITHOTRIPSY  02/14/2010   HYSTEROSCOPY WITH D & C N/A 11/27/2012   Procedure: DILATATION AND CURETTAGE /HYSTEROSCOPY;  Surgeon: Marylynn Pearson, MD;  Location: [facility];  Service: Gynecology;  Laterality: N/A;   SUBMUCOSAL LIFTING INJECTION  05/24/2020   Procedure: SUBMUCOSAL LIFTING INJECTION;  Surgeon: Rush Landmark Telford Nab., MD;  Location: [facility];  Service: Gastroenterology;;   WISDOM TOOTH EXTRACTION      There were no vitals filed for this visit.   Subjective Assessment - 11/30/20 1750     Subjective Pr reports that her R heel has continued to improve, although she has continued L dorsal foot pain. She adds that she recently received a prescription for prednisone,  which has helped to decrease her pain. She reports continued adherence to her HEP.    Pain Score 3     Pain Location Heel    Pain Orientation Right    Pain Descriptors / Indicators Aching    Pain Type Chronic pain    Pain Onset More than a month ago    Pain Frequency Intermittent                               [facility] Adult PT Treatment/Exercise - 11/30/20 0001       Modalities   Modalities Iontophoresis      Iontophoresis   Type of Iontophoresis Dexamethasone    Location R calcaneus    Dose 2 ML    Time 4  hour      Ankle Exercises: Machines for Strengthening   Cybex Leg Press Bouncing heel raises 3x30, 80#      Ankle Exercises: Stretches   Gastroc Stretch 60 seconds;2 reps      Ankle Exercises: Seated   Heel Raises Other (comment)   Eccentric heel raise on Cybex Leg press (2up, 1 down) with 80# 3x10     Ankle Exercises: Standing   Other Standing Ankle Exercises Cybex hip abduction and extension with 37.5# cable 2x10 BIL each    Other Standing Ankle Exercises Forward/ backward toe hopping 3x30sec      Ankle Exercises: Plyometrics   Plyometric Exercises Squat jumps 3x10                     PT Education - 11/30/20 1817     Education Details Pt educated on potential side effects of iontophoresis with dexamethasone. Also educated to take the patch off if she experiences any of these sxs. Pt educated that the patch is a 4-hour patch.    Person(s) Educated Patient    Methods Explanation;Handout    Comprehension Verbalized understanding              PT Short Term Goals - 11/04/20 1830       PT SHORT TERM GOAL #1   Title Pt will reports understanding and adherence of her HEP in order to promote independence in the management of her primary impairments.    Baseline Pt reports adherence to HEP.    Time 4    Period Weeks    Status Achieved    Target Date 11/24/20               PT Long Term Goals - 10/27/20 1919       PT LONG TERM GOAL #1   Title Pt will achieve a FOTO score of 61% in order to demonstrate improved functional ability in regard to her heel pain.    Baseline 47%    Time 8    Period Weeks    Status New    Target Date 12/22/20      PT LONG TERM GOAL #2   Title Pt will demonstrate ability to perform 15 SL heel raises with good form on the R in order to demonstrate functional PF strength to stand on toes when reaching into overhead cabinets.    Baseline 5 SL heel raises on R with pain    Time 8    Period Weeks    Status New    Target Date  12/22/20      PT LONG TERM GOAL #3   Title Pt will  report ability to walk >30 minutes in order to grocery shop without limitation due to pain.    Baseline Pt unable to walk longer than 15-20 minutes due to pain.    Time 8    Period Weeks    Status New    Target Date 12/22/20      PT LONG TERM GOAL #4   Title Pt will demonstrate R DF AROM =/> 5 degrees in order to clear foot during walking without limitation.    Baseline -5 degrees on R    Time 8    Period Weeks    Status New    Target Date 12/22/20      PT LONG TERM GOAL #5   Title Pt will achieve global BIL hip strength >/= 4+/5 in order to progress independent LE strengthening regimen without limitation.    Baseline global BIL hip strength ranging from 3/5 to 4/5    Time 8    Period Weeks    Status New    Target Date 12/22/20                   Plan - 11/30/20 1824     Clinical Impression Statement Pt arrived 7 minutes late to her appointment today, which led to a slightly truncated treatment session. She responded well to all interventions today, demonstrating proper form and no increase in pain with all selected exercises. The pt requested iontophoresis patch per her referring provider's request. She was given a Dexamethasone iontophoresis patch and given a handout with instruction on potential side effects. She will continue to benefit from skilled PT to address her primary impairments and return to her prior level of function without limitation.    Personal Factors and Comorbidities Comorbidity 3+;Fitness    Comorbidities See medical Hx    Examination-Activity Limitations Locomotion Level;Stairs    Examination-Participation Restrictions Occupation;Community Activity;Shop    Stability/Clinical Decision Making Stable/Uncomplicated    Clinical Decision Making Low    Rehab Potential Good    PT Frequency 2x / week    PT Duration 8 weeks    PT Treatment/Interventions ADLs/Self Care Home  Management;Biofeedback;Cryotherapy;Electrical Stimulation;Iontophoresis 4mg /ml Dexamethasone;Moist Heat;Gait training;Stair training;Functional mobility training;Therapeutic activities;Therapeutic exercise;Balance training;Neuromuscular re-education;Manual techniques;Joint Manipulations;Passive range of motion;Patient/family education;Taping;Vasopneumatic Device    PT Next Visit Plan Progress LE strengthening program with focus on PF strength and introduce hip strengthening, progress to stair/ gait training, assess response to iontophoresis    PT Home Exercise Plan [email or web address]    Consulted and Agree with Plan of Care Patient             Patient will benefit from skilled therapeutic intervention in order to improve the following deficits and impairments:  Abnormal gait, Decreased range of motion, Difficulty walking, Obesity, Pain, Impaired flexibility, Decreased balance, Decreased strength  Visit Diagnosis: Pain in right ankle and joints of right foot  Stiffness of right ankle, not elsewhere classified  Muscle weakness (generalized)  Unsteadiness on feet     Problem List Patient Active Problem List   Diagnosis Date Noted   DDD (degenerative disc disease), cervical 07/25/2018   Cervical radiculopathy 06/19/2018   High hematocrit without dehydration 05/09/2018   Snoring 05/09/2018   Sleep apnea in adult 05/09/2018   Excessive daytime sleepiness 05/09/2018   Elevated hemoglobin ([place]) 05/09/2018   History of colonic polyps 03/21/2018   Hemorrhoids 03/21/2018   Rectal bleeding 03/21/2018   RUQ pain 03/21/2018   GERD (gastroesophageal reflux disease) 11/06/2017   Hyperlipidemia 11/06/2017  Varicose veins of leg with pain, bilateral 11/06/2017   Left shoulder pain 01/28/2013   Proximal humerus fracture 01/28/2013    Vanessa Mc[place], PT, DPT 11/30/20 6:33 PM   [place], [place], [address] Phone:  [phone]   Fax:  [phone]  Name: Caitlin Moran MRN: [id number] Date of Birth: 02-Sep-1960

## 2020-12-02 ENCOUNTER — Ambulatory Visit: Payer: PRIVATE HEALTH INSURANCE

## 2020-12-02 ENCOUNTER — Other Ambulatory Visit: Payer: Self-pay

## 2020-12-02 DIAGNOSIS — M6281 Muscle weakness (generalized): Secondary | ICD-10-CM

## 2020-12-02 DIAGNOSIS — M25571 Pain in right ankle and joints of right foot: Secondary | ICD-10-CM | POA: Diagnosis not present

## 2020-12-02 DIAGNOSIS — R2681 Unsteadiness on feet: Secondary | ICD-10-CM

## 2020-12-02 DIAGNOSIS — M25671 Stiffness of right ankle, not elsewhere classified: Secondary | ICD-10-CM

## 2020-12-02 NOTE — Patient Instructions (Signed)
  [id number]

## 2020-12-02 NOTE — Therapy (Signed)
[place], [place], [address] Phone: [phone]   Fax:  [phone]  Physical Therapy Treatment  Patient Details  Name: Caitlin Moran MRN: [id number] Date of Birth: 12-Sep-1960 Referring Provider (PT): Evelina Bucy   Encounter Date: 12/02/2020   PT End of Session - 12/02/20 1741     Visit Number 6    Number of Visits 17    Date for PT Re-Evaluation 12/22/20    Authorization Type BAS (Private)    Authorization Time Period FOTO v6, 10    Progress Note Due on Visit 10    PT Start Time 1705    PT Stop Time 1750    PT Time Calculation (min) 45 min    Activity Tolerance Patient tolerated treatment well    Behavior During Therapy [facility] for tasks assessed/performed             Past Medical History:  Diagnosis Date   Anxiety    Arthritis    neck   Depression    GERD (gastroesophageal reflux disease)    Headache    History of kidney stones    kidney stone -lithrotrispy 04/30/18- 2 current - stable   IBS (irritable bowel syndrome)    OSA (obstructive sleep apnea)    on CPAP   Polycythemia vera (HCC)    PONV (postoperative nausea and vomiting)    Didn't go to sleep all the way  during colonoscopy.   Renal calculus, right    Right ureteral stone    Spinal stenosis     Past Surgical History:  Procedure Laterality Date   ABLATION     unquestionable   BIOPSY  05/01/2018   Procedure: BIOPSY;  Surgeon: Rush Landmark Telford Nab., MD;  Location: [place];  Service: Gastroenterology;;   BIOPSY  05/24/2020   Procedure: BIOPSY;  Surgeon: Irving Copas., MD;  Location: [facility];  Service: Gastroenterology;;   CESAREAN SECTION     CHOLECYSTECTOMY     COLONOSCOPY     COLONOSCOPY WITH PROPOFOL N/A 05/24/2020   Procedure: COLONOSCOPY WITH PROPOFOL;  Surgeon: Irving Copas., MD;  Location: [facility];  Service: Gastroenterology;  Laterality: N/A;   ESOPHAGOGASTRODUODENOSCOPY      ESOPHAGOGASTRODUODENOSCOPY N/A 05/01/2018   Procedure: ESOPHAGOGASTRODUODENOSCOPY (EGD);  Surgeon: Irving Copas., MD;  Location: [place];  Service: Gastroenterology;  Laterality: N/A;   ESOPHAGOGASTRODUODENOSCOPY (EGD) WITH PROPOFOL N/A 05/24/2020   Procedure: ESOPHAGOGASTRODUODENOSCOPY (EGD) WITH PROPOFOL;  Surgeon: Rush Landmark Telford Nab., MD;  Location: [facility];  Service: Gastroenterology;  Laterality: N/A;   EUS N/A 05/01/2018   Procedure: UPPER ENDOSCOPIC ULTRASOUND (EUS) RADIAL;  Surgeon: Irving Copas., MD;  Location: [place];  Service: Gastroenterology;  Laterality: N/A;   EXTRACORPOREAL SHOCK WAVE LITHOTRIPSY  02/14/2010   HYSTEROSCOPY WITH D & C N/A 11/27/2012   Procedure: DILATATION AND CURETTAGE /HYSTEROSCOPY;  Surgeon: Marylynn Pearson, MD;  Location: [facility];  Service: Gynecology;  Laterality: N/A;   SUBMUCOSAL LIFTING INJECTION  05/24/2020   Procedure: SUBMUCOSAL LIFTING INJECTION;  Surgeon: Rush Landmark Telford Nab., MD;  Location: [facility];  Service: Gastroenterology;;   WISDOM TOOTH EXTRACTION      There were no vitals filed for this visit.   Subjective Assessment - 12/02/20 1705     Subjective Pt reports mild R heel pain today and adds that her L foot pain has improved since taking prednisone. She also reports that the iontophoresis patch has helped her pain since her last visit. She states she  has performed her HEP daily.    Currently in Pain? Yes    Pain Location Heel    Pain Orientation Right    Pain Descriptors / Indicators Aching    Pain Type Chronic pain                               [facility] Adult PT Treatment/Exercise - 12/02/20 0001       Exercises   Exercises Knee/Hip      Knee/Hip Exercises: Machines for Strengthening   Hip Cybex abduction and extension 2x10 each BIL with 42.5#      Modalities   Modalities Iontophoresis      Iontophoresis   Type of Iontophoresis Dexamethasone    Location R  calcaneus    Dose 2 ML    Time 4 hour      Ankle Exercises: Stretches   Gastroc Stretch 60 seconds;2 reps      Ankle Exercises: Machines for Strengthening   Cybex Leg Press Bouncing SL heel raises 3x30, 40#      Ankle Exercises: Plyometrics   Plyometric Exercises Squat into heel raise 2x10      Ankle Exercises: Standing   Other Standing Ankle Exercises SL Heel/toe rocks on each side of Bosu ball 2x10 each side BIL                     PT Education - 12/02/20 1740     Education Details Updated HEP. Pt re-educated on potential side effects of iontophoresis with dexamethasone. Also educated to take the patch off if she experiences any of these sxs. Pt educated that the patch is a 4-hour patch.    Person(s) Educated Patient    Methods Explanation;Demonstration;Handout    Comprehension Verbalized understanding;Returned demonstration              PT Short Term Goals - 11/04/20 1830       PT SHORT TERM GOAL #1   Title Pt will reports understanding and adherence of her HEP in order to promote independence in the management of her primary impairments.    Baseline Pt reports adherence to HEP.    Time 4    Period Weeks    Status Achieved    Target Date 11/24/20               PT Long Term Goals - 12/02/20 1748       PT LONG TERM GOAL #1   Title Pt will achieve a FOTO score of 61% in order to demonstrate improved functional ability in regard to her heel pain.    Baseline 47%, 55% (12/02/2020)    Time 8    Period Weeks    Status On-going      PT LONG TERM GOAL #2   Title Pt will demonstrate ability to perform 15 SL heel raises with good form on the R in order to demonstrate functional PF strength to stand on toes when reaching into overhead cabinets.    Baseline 5 SL heel raises on R with pain    Time 8    Period Weeks    Status On-going      PT LONG TERM GOAL #3   Title Pt will report ability to walk >30 minutes in order to grocery shop without  limitation due to pain.    Baseline Pt unable to walk longer than 15-20 minutes due to pain.    Time  8    Period Weeks    Status On-going      PT LONG TERM GOAL #4   Title Pt will demonstrate R DF AROM =/> 5 degrees in order to clear foot during walking without limitation.    Baseline -5 degrees on R    Time 8    Period Weeks    Status On-going      PT LONG TERM GOAL #5   Title Pt will achieve global BIL hip strength >/= 4+/5 in order to progress independent LE strengthening regimen without limitation.    Baseline global BIL hip strength ranging from 3/5 to 4/5    Time 8    Period Weeks    Status On-going                   Plan - 12/02/20 1741     Clinical Impression Statement Pt responded well to all interventions today, demonstrating proper form and no increase in pain with all selected exercises. Due to positive response to iontophoresis in last session, the pt opted to receive this treatment again today. Upon re-assessment of her FOTO score, the pt demonstrates improved functional ability as indicated by an 8-point improvement in 6 visits, putting her on track to meet her FOTO score by the end of her POC. She will continue to benefit from skilled PT to address her primary impairments and return to her prior level of function without limitation.    Personal Factors and Comorbidities Comorbidity 3+;Fitness    Comorbidities See medical Hx    Examination-Activity Limitations Locomotion Level;Stairs    Examination-Participation Restrictions Occupation;Community Activity;Shop    Stability/Clinical Decision Making Stable/Uncomplicated    Clinical Decision Making Low    Rehab Potential Good    PT Frequency 2x / week    PT Duration 8 weeks    PT Treatment/Interventions ADLs/Self Care Home Management;Biofeedback;Cryotherapy;Electrical Stimulation;Iontophoresis 4mg /ml Dexamethasone;Moist Heat;Gait training;Stair training;Functional mobility training;Therapeutic  activities;Therapeutic exercise;Balance training;Neuromuscular re-education;Manual techniques;Joint Manipulations;Passive range of motion;Patient/family education;Taping;Vasopneumatic Device    PT Next Visit Plan Progress LE strengthening program with focus on PF strength and introduce hip strengthening, progress to stair/ gait training    PT Home Exercise Plan [id number]    Consulted and Agree with Plan of Care Patient             Patient will benefit from skilled therapeutic intervention in order to improve the following deficits and impairments:  Abnormal gait, Decreased range of motion, Difficulty walking, Obesity, Pain, Impaired flexibility, Decreased balance, Decreased strength  Visit Diagnosis: Pain in right ankle and joints of right foot  Stiffness of right ankle, not elsewhere classified  Muscle weakness (generalized)  Unsteadiness on feet     Problem List Patient Active Problem List   Diagnosis Date Noted   DDD (degenerative disc disease), cervical 07/25/2018   Cervical radiculopathy 06/19/2018   High hematocrit without dehydration 05/09/2018   Snoring 05/09/2018   Sleep apnea in adult 05/09/2018   Excessive daytime sleepiness 05/09/2018   Elevated hemoglobin ([place]) 05/09/2018   History of colonic polyps 03/21/2018   Hemorrhoids 03/21/2018   Rectal bleeding 03/21/2018   RUQ pain 03/21/2018   GERD (gastroesophageal reflux disease) 11/06/2017   Hyperlipidemia 11/06/2017   Varicose veins of leg with pain, bilateral 11/06/2017   Left shoulder pain 01/28/2013   Proximal humerus fracture 01/28/2013    Vanessa Waves, PT, DPT 12/02/20 5:50 PM   [facility] [address] [place], [place], [address] Phone: [phone]  Fax:  [phone]  Name: Caitlin Moran MRN: [id number] Date of Birth: [birth year]

## 2020-12-03 ENCOUNTER — Ambulatory Visit (INDEPENDENT_AMBULATORY_CARE_PROVIDER_SITE_OTHER): Payer: PRIVATE HEALTH INSURANCE | Admitting: Physician Assistant

## 2020-12-03 ENCOUNTER — Ambulatory Visit
Admission: RE | Admit: 2020-12-03 | Discharge: 2020-12-03 | Disposition: A | Discharge status: Home / Self Care | Payer: PRIVATE HEALTH INSURANCE | Source: Ambulatory Visit | Attending: Physician Assistant | Admitting: Physician Assistant

## 2020-12-03 ENCOUNTER — Encounter: Payer: Self-pay | Admitting: Physician Assistant

## 2020-12-03 ENCOUNTER — Ambulatory Visit
Admission: RE | Admit: 2020-12-03 | Discharge: 2020-12-03 | Disposition: A | Discharge status: Home / Self Care | Payer: PRIVATE HEALTH INSURANCE | Attending: Physician Assistant | Admitting: Physician Assistant

## 2020-12-03 VITALS — BP 140/87 | HR 93 | Ht 67.0 in | Wt 210.0 lb

## 2020-12-03 DIAGNOSIS — N200 Calculus of kidney: Secondary | ICD-10-CM | POA: Insufficient documentation

## 2020-12-03 DIAGNOSIS — R109 Unspecified abdominal pain: Secondary | ICD-10-CM | POA: Diagnosis not present

## 2020-12-03 DIAGNOSIS — Z87442 Personal history of urinary calculi: Secondary | ICD-10-CM | POA: Diagnosis not present

## 2020-12-03 LAB — MICROSCOPIC EXAMINATION

## 2020-12-03 LAB — URINALYSIS, COMPLETE
Bilirubin, UA: NEGATIVE
Glucose, UA: NEGATIVE
Leukocytes,UA: NEGATIVE
Nitrite, UA: NEGATIVE
Specific Gravity, UA: >1.03 — ABNORMAL HIGH (ref 1.005–1.030)
Urobilinogen, Ur: 1 mg/dL (ref 0.2–1.0)
pH, UA: 6 (ref 5.0–7.5)

## 2020-12-03 MED ORDER — CEFUROXIME AXETIL 250 MG PO TABS: 250.0000 mg | ORAL_TABLET | Freq: Two times a day (BID) | ORAL | 0 refills | Status: AC

## 2020-12-03 NOTE — Progress Notes (Signed)
12/03/2020 1:18 PM   Caitlin Moran [birth year] [id number]  CC: Chief Complaint  Patient presents with   Nephrolithiasis   HPI: Caitlin Moran is a 60 y.o. female with PMH nephrolithiasis and OSA on CPAP who presents today for evaluation of possible acute stone episode.   Today she reports intermittent right flank/side pain that is sharp and stabbing in nature.  She saw Dr. Erlene Quan with similar symptoms on 11/03/2020.  She is treated with culture appropriate Bactrim for E. coli positive UTI at the time.  She thinks her pain improved on antibiotics but returned several days after completion.  She reports low back pain at baseline and has been taking prescription pain medication to help with her pain, which has helped.  She denies fever, chills, nausea, vomiting, gross hematuria, or seeing any stones pass.  Patient localizes the site of her pain to her right side, around the mid axillary line.  She underwent CT stone study on 10/28/2020, which revealed stable right lower pole stones with no evidence of hydronephrosis or ureteral stones.  She underwent KUB today with apparent stability of her right lower pole stones with no evidence of radiopaque ureteral stones.  In-office UA today positive for trace ketones, trace intact blood, and 1+ protein; urine microscopy with 11-30 WBCs/HPF and 3-10 RBCs/HPF.  PMH: Past Medical History:  Diagnosis Date   Anxiety    Arthritis    neck   Depression    GERD (gastroesophageal reflux disease)    Headache    History of kidney stones    kidney stone -lithrotrispy 04/30/18- 2 current - stable   IBS (irritable bowel syndrome)    OSA (obstructive sleep apnea)    on CPAP   Polycythemia vera (HCC)    PONV (postoperative nausea and vomiting)    Didn't go to sleep all the way  during colonoscopy.   Renal calculus, right    Right ureteral stone    Spinal stenosis     Surgical History: Past Surgical History:  Procedure Laterality Date    ABLATION     unquestionable   BIOPSY  05/01/2018   Procedure: BIOPSY;  Surgeon: Rush Landmark Telford Nab., MD;  Location: [place];  Service: Gastroenterology;;   BIOPSY  05/24/2020   Procedure: BIOPSY;  Surgeon: Irving Copas., MD;  Location: [facility];  Service: Gastroenterology;;   CESAREAN SECTION     CHOLECYSTECTOMY     COLONOSCOPY     COLONOSCOPY WITH PROPOFOL N/A 05/24/2020   Procedure: COLONOSCOPY WITH PROPOFOL;  Surgeon: Irving Copas., MD;  Location: [facility];  Service: Gastroenterology;  Laterality: N/A;   ESOPHAGOGASTRODUODENOSCOPY     ESOPHAGOGASTRODUODENOSCOPY N/A 05/01/2018   Procedure: ESOPHAGOGASTRODUODENOSCOPY (EGD);  Surgeon: Irving Copas., MD;  Location: [place];  Service: Gastroenterology;  Laterality: N/A;   ESOPHAGOGASTRODUODENOSCOPY (EGD) WITH PROPOFOL N/A 05/24/2020   Procedure: ESOPHAGOGASTRODUODENOSCOPY (EGD) WITH PROPOFOL;  Surgeon: Rush Landmark Telford Nab., MD;  Location: [facility];  Service: Gastroenterology;  Laterality: N/A;   EUS N/A 05/01/2018   Procedure: UPPER ENDOSCOPIC ULTRASOUND (EUS) RADIAL;  Surgeon: Irving Copas., MD;  Location: [place];  Service: Gastroenterology;  Laterality: N/A;   EXTRACORPOREAL SHOCK WAVE LITHOTRIPSY  02/14/2010   HYSTEROSCOPY WITH D & C N/A 11/27/2012   Procedure: DILATATION AND CURETTAGE /HYSTEROSCOPY;  Surgeon: Marylynn Pearson, MD;  Location: [facility];  Service: Gynecology;  Laterality: N/A;   SUBMUCOSAL LIFTING INJECTION  05/24/2020   Procedure: SUBMUCOSAL LIFTING INJECTION;  Surgeon: Rush Landmark Telford Nab., MD;  Location: [facility];  Service: Gastroenterology;;   WISDOM TOOTH EXTRACTION      Home Medications:  Allergies as of 12/03/2020   No Known Allergies      Medication List        Accurate as of December 03, 2020  1:18 PM. If you have any questions, ask your nurse or doctor.          aspirin EC 81 MG tablet Take 81 mg by mouth daily.    Calcium-Vitamin D 600-400 MG-UNIT Tabs Take 1 tablet by mouth daily.   celecoxib 200 MG capsule Commonly known as: CELEBREX Take 200 mg by mouth daily.   clobetasol cream 0.05 % Commonly known as: TEMOVATE Apply 1 application topically daily as needed (for psoriasis).   fish oil-omega-3 fatty acids 1000 MG capsule Take 1 g by mouth daily.   Golimumab 50 MG/0.5ML Soaj Inject 50 mg into the skin every 30 (thirty) days.   HYDROcodone-acetaminophen 7.5-325 MG tablet Commonly known as: NORCO Take 1 tablet by mouth every 6 (six) hours as needed for moderate pain.   methylPREDNISolone 4 MG Tbpk tablet Commonly known as: MEDROL DOSEPAK 6 Day Taper Pack. Take as Directed.   omeprazole 40 MG capsule Commonly known as: PRILOSEC Take 1 capsule (40 mg total) by mouth 2 (two) times daily before a meal.   PROBIOTIC PO Take 1 capsule by mouth daily.   rosuvastatin 5 MG tablet Commonly known as: CRESTOR Take 5 mg by mouth daily.   sulfamethoxazole-trimethoprim 800-160 MG tablet Commonly known as: BACTRIM DS Take 1 tablet by mouth every 12 (twelve) hours.   tiZANidine 2 MG tablet Commonly known as: ZANAFLEX Take 2 mg by mouth 3 (three) times daily as needed.   Turmeric Curcumin 500 MG Caps See admin instructions.   Vitamin D 50 MCG (2000 UT) Caps Take 2,000 Units by mouth daily.   vortioxetine HBr 20 MG Tabs tablet Commonly known as: TRINTELLIX Take 20 mg by mouth daily.   Wegovy 1 MG/0.5ML Soaj Generic drug: Semaglutide-Weight Management 1 mg        Allergies:  No Known Allergies  Family History: Family History  Problem Relation Age of Onset   Breast cancer Maternal Aunt    Esophageal cancer Mother    Lung cancer Father    CAD Father    Colon cancer Neg Hx    Inflammatory bowel disease Neg Hx    Liver disease Neg Hx    Pancreatic cancer Neg Hx    Stomach cancer Neg Hx    Rectal cancer Neg Hx     Social History:   reports that she quit smoking about 30  years ago. Her smoking use included cigarettes. She has never used smokeless tobacco. She reports current alcohol use of about 7.0 standard drinks per week. She reports that she does not use drugs.  Physical Exam: BP 140/87   Pulse 93   Ht 5\' 7"  (1.702 m)   Wt 210 lb (95.3 kg)   LMP 09/24/2012   BMI 32.89 kg/m   Constitutional:  Alert and oriented, no acute distress, nontoxic appearing HEENT: [place], AT Cardiovascular: No clubbing, cyanosis, or edema Respiratory: Normal respiratory effort, no increased work of breathing GU: No CVA tenderness Skin: No rashes, bruises or suspicious lesions Neurologic: Grossly intact, no focal deficits, moving all 4 extremities Psychiatric: Normal mood and affect  Laboratory Data: Results for orders placed or performed in visit on 12/03/20  Microscopic Examination   Urine  Result Value Ref Range  WBC, UA 11-30 (A) 0 - 5 /hpf   RBC 3-10 (A) 0 - 2 /hpf   Epithelial Cells (non renal) 0-10 0 - 10 /hpf   Casts Present (A) None seen /lpf   Cast Type Hyaline casts N/A   Bacteria, UA Few None seen/Few  Urinalysis, Complete  Result Value Ref Range   Specific Gravity, UA >1.030 (H) 1.005 - 1.030   pH, UA 6.0 5.0 - 7.5   Color, UA Orange Yellow   Appearance Ur Cloudy (A) Clear   Leukocytes,UA Negative Negative   Protein,UA 1+ (A) Negative/Trace   Glucose, UA Negative Negative   Ketones, UA Trace (A) Negative   RBC, UA Trace (A) Negative   Bilirubin, UA Negative Negative   Urobilinogen, Ur 1.0 0.2 - 1.0 mg/dL   Nitrite, UA Negative Negative   Microscopic Examination See below:    Pertinent Imaging: KUB, 12/03/2020: CLINICAL DATA:  Nephrolithiasis   EXAM: ABDOMEN - 1 VIEW   COMPARISON:  CT 10/28/2020   FINDINGS: Imaging is slightly limited by motion artifact. A ovoid calculus overlies the lower pole of the right kidney, similar to prior CT examination measuring roughly 5 mm x 19 mm. No additional renal or ureteral calculi. Numerous surgical  clips are seen within the right upper quadrant. Endoscopic clip noted within the epigastric region. Normal abdominal gas pattern. No acute bone abnormality.   IMPRESSION: Stable 19 mm calculus overlying the expected lower pole the right kidney. No urolithiasis.     Electronically Signed   By: Fidela Salisbury M.D.   On: 12/05/2020 03:33  I personally reviewed the images referenced above and note stable right lower pole stones without radiopaque ureteral calculi.  Assessment & Plan:   1. Flank pain with history of urolithiasis UA again suspicious today, will start empiric cefuroxime and send for culture for further evaluation.  We will also obtain renal ultrasound to evaluate for hydronephrosis or absent ureteral jets, which would be consistent with acute stone episode.  I do not think it is necessary to repeat CT stone study, given that this was performed after onset of her symptoms and showed no acute pathology.  Based on location of her pain, I suspect this may be musculoskeletal pain.  We discussed proactive stone management including ureteroscopy today, but I explained that if her stones are not the source of her pain, then this procedure is not expected to resolve it.  She expressed understanding and wishes to pursue renal ultrasound before making a decision on how to proceed.  I think this is reasonable. - Urinalysis, Complete - CULTURE, URINE COMPREHENSIVE - US RENAL; Future - cefUROXime (CEFTIN) 250 MG tablet; Take 1 tablet (250 mg total) by mouth 2 (two) times daily with a meal for 7 days.  Dispense: 14 tablet; Refill: 0   Return for Will call with results.  Debroah Loop, PA-C  [facility] [address] [place], [place] [address] [phone]

## 2020-12-07 ENCOUNTER — Ambulatory Visit: Payer: PRIVATE HEALTH INSURANCE

## 2020-12-07 ENCOUNTER — Encounter: Payer: Self-pay | Admitting: Vascular Surgery

## 2020-12-07 ENCOUNTER — Ambulatory Visit (HOSPITAL_COMMUNITY)
Admission: RE | Admit: 2020-12-07 | Discharge: 2020-12-07 | Disposition: A | Discharge status: Home / Self Care | Payer: PRIVATE HEALTH INSURANCE | Source: Ambulatory Visit | Attending: Vascular Surgery | Admitting: Vascular Surgery

## 2020-12-07 ENCOUNTER — Ambulatory Visit (INDEPENDENT_AMBULATORY_CARE_PROVIDER_SITE_OTHER): Payer: PRIVATE HEALTH INSURANCE | Admitting: Vascular Surgery

## 2020-12-07 ENCOUNTER — Other Ambulatory Visit: Payer: Self-pay

## 2020-12-07 VITALS — BP 152/90 | HR 66 | Temp 98.3°F | Resp 20 | Ht 67.0 in | Wt 244.0 lb

## 2020-12-07 DIAGNOSIS — I872 Venous insufficiency (chronic) (peripheral): Secondary | ICD-10-CM | POA: Diagnosis not present

## 2020-12-07 DIAGNOSIS — I83813 Varicose veins of bilateral lower extremities with pain: Secondary | ICD-10-CM | POA: Diagnosis not present

## 2020-12-07 NOTE — Progress Notes (Signed)
[facility] [place]  ASSESSMENT / PLAN: 60 y.o. female with history of right greater saphenous vein ablation.  She presents concern for pain over her left dorsal foot.  She has good Doppler flow into the left foot without any classic symptoms of arterial disease.  Her symptoms improved with steroid therapy.  I reassured her that I did not think she had chronic limb threatening ischemia.  She can follow-up with me as needed.  CHIEF COMPLAINT: Left foot pain  HISTORY OF PRESENT ILLNESS: Caitlin Moran is a 60 y.o. female who presents to clinic for evaluation of left foot pain.  The patient reports pain about the dorsum of her left midfoot.  She reports an inflamed vein over the foot occurred several weeks ago.  She was seen by podiatrist and diagnosed with Achilles tendinitis.  Both the podiatrist and her primary care physician noted her pulses in her left foot diminished.  Patient reports no classic symptoms of peripheral arterial disease.  She specifically denies symptoms of intermittent claudication or ischemic rest pain.  She has no ulcers about her feet.  Past Medical History:  Diagnosis Date   Anxiety    Arthritis    neck   Depression    GERD (gastroesophageal reflux disease)    Headache    History of kidney stones    kidney stone -lithrotrispy 04/30/18- 2 current - stable   IBS (irritable bowel syndrome)    OSA (obstructive sleep apnea)    on CPAP   Polycythemia vera (HCC)    PONV (postoperative nausea and vomiting)    Didn't go to sleep all the way  during colonoscopy.   Renal calculus, right    Right ureteral stone    Spinal stenosis     Past Surgical History:  Procedure Laterality Date   ABLATION     unquestionable   BIOPSY  05/01/2018   Procedure: BIOPSY;  Surgeon: Rush Landmark Telford Nab., MD;  Location: [place];  Service: Gastroenterology;;   BIOPSY  05/24/2020   Procedure: BIOPSY;  Surgeon: Irving Copas., MD;  Location: [facility];  Service: Gastroenterology;;   CESAREAN SECTION     CHOLECYSTECTOMY     COLONOSCOPY     COLONOSCOPY WITH PROPOFOL N/A 05/24/2020   Procedure: COLONOSCOPY WITH PROPOFOL;  Surgeon: Irving Copas., MD;  Location: [facility];  Service: Gastroenterology;  Laterality: N/A;   ESOPHAGOGASTRODUODENOSCOPY     ESOPHAGOGASTRODUODENOSCOPY N/A 05/01/2018   Procedure: ESOPHAGOGASTRODUODENOSCOPY (EGD);  Surgeon: Irving Copas., MD;  Location: [place];  Service: Gastroenterology;  Laterality: N/A;   ESOPHAGOGASTRODUODENOSCOPY (EGD) WITH PROPOFOL N/A 05/24/2020   Procedure: ESOPHAGOGASTRODUODENOSCOPY (EGD) WITH PROPOFOL;  Surgeon: Rush Landmark Telford Nab., MD;  Location: [facility];  Service: Gastroenterology;  Laterality: N/A;   EUS N/A 05/01/2018   Procedure: UPPER ENDOSCOPIC ULTRASOUND (EUS) RADIAL;  Surgeon: Irving Copas., MD;  Location: [place];  Service: Gastroenterology;  Laterality: N/A;   EXTRACORPOREAL SHOCK WAVE LITHOTRIPSY  02/14/2010   HYSTEROSCOPY WITH D & C N/A 11/27/2012   Procedure: DILATATION AND CURETTAGE /HYSTEROSCOPY;  Surgeon: Marylynn Pearson, MD;  Location: [facility];  Service: Gynecology;  Laterality: N/A;   SUBMUCOSAL LIFTING INJECTION  05/24/2020   Procedure: SUBMUCOSAL LIFTING INJECTION;  Surgeon: Rush Landmark Telford Nab., MD;  Location: [facility];  Service: Gastroenterology;;   WISDOM TOOTH EXTRACTION      Family History  Problem Relation Age of Onset   Breast cancer Maternal Aunt    Esophageal cancer Mother    Lung  cancer Father    CAD Father    Colon cancer Neg Hx    Inflammatory bowel disease Neg Hx    Liver disease Neg Hx    Pancreatic cancer Neg Hx    Stomach cancer Neg Hx    Rectal cancer Neg Hx     Social History   Socioeconomic History   Marital status: Married    Spouse name: Not on file   Number of children: 2   Years of education: Not on file   Highest education level: Not on file  Occupational History    Occupation: RN  Tobacco Use   Smoking status: Former    Types: Cigarettes    Quit date: 11/05/1990    Years since quitting: 30.1   Smokeless tobacco: Never  Vaping Use   Vaping Use: Never used  Substance and Sexual Activity   Alcohol use: Yes    Alcohol/week: 7.0 standard drinks    Types: 7 Glasses of wine per week    Comment: 5   Drug use: No   Sexual activity: Not on file  Other Topics Concern   Not on file  Social History Narrative   Not on file   Social Determinants of Health   Financial Resource Strain: Not on file  Food Insecurity: Not on file  Transportation Needs: Not on file  Physical Activity: Not on file  Stress: Not on file  Social Connections: Not on file  Intimate Partner Violence: Not on file    No Known Allergies  Current Outpatient Medications  Medication Sig Dispense Refill   aspirin EC 81 MG tablet Take 81 mg by mouth daily.     Calcium Carb-Cholecalciferol (CALCIUM-VITAMIN D) 600-400 MG-UNIT TABS Take 1 tablet by mouth daily.     cefUROXime (CEFTIN) 250 MG tablet Take 1 tablet (250 mg total) by mouth 2 (two) times daily with a meal for 7 days. 14 tablet 0   celecoxib (CELEBREX) 200 MG capsule Take 200 mg by mouth daily.      Cholecalciferol (VITAMIN D) 50 MCG (2000 UT) CAPS Take 2,000 Units by mouth daily.      clobetasol cream (TEMOVATE) 4.16 % Apply 1 application topically daily as needed (for psoriasis).      fish oil-omega-3 fatty acids 1000 MG capsule Take 1 g by mouth daily.      Golimumab 50 MG/0.5ML SOAJ Inject 50 mg into the skin every 30 (thirty) days.      HYDROcodone-acetaminophen (NORCO) 7.5-325 MG tablet Take 1 tablet by mouth every 6 (six) hours as needed for moderate pain.  0   omeprazole (PRILOSEC) 40 MG capsule Take 1 capsule (40 mg total) by mouth 2 (two) times daily before a meal. 60 capsule 0   Probiotic Product (PROBIOTIC PO) Take 1 capsule by mouth daily.     rosuvastatin (CRESTOR) 5 MG tablet Take 5 mg by mouth daily.      tiZANidine (ZANAFLEX) 2 MG tablet Take 2 mg by mouth 3 (three) times daily as needed.     Turmeric Curcumin 500 MG CAPS See admin instructions.     vortioxetine HBr (TRINTELLIX) 20 MG TABS tablet Take 20 mg by mouth daily.     No current facility-administered medications for this visit.    REVIEW OF SYSTEMS:  [X]  denotes positive finding, [ ]  denotes negative finding Cardiac  Comments:  Chest pain or chest pressure:    Shortness of breath upon exertion:    Short of breath when lying flat:  Irregular heart rhythm:        Vascular    Pain in calf, thigh, or hip brought on by ambulation:    Pain in feet at night that wakes you up from your sleep:     Blood clot in your veins:    Leg swelling:         Pulmonary    Oxygen at home:    Productive cough:     Wheezing:         Neurologic    Sudden weakness in arms or legs:     Sudden numbness in arms or legs:     Sudden onset of difficulty speaking or slurred speech:    Temporary loss of vision in one eye:     Problems with dizziness:         Gastrointestinal    Blood in stool:     Vomited blood:         Genitourinary    Burning when urinating:     Blood in urine:        Psychiatric    Major depression:         Hematologic    Bleeding problems:    Problems with blood clotting too easily:        Skin    Rashes or ulcers:        Constitutional    Fever or chills:      PHYSICAL EXAM Vitals:   12/07/20 1412  BP: (!) 152/90  Pulse: 66  Resp: 20  Temp: 98.3 F (36.8 C)  SpO2: 97%  Weight: 244 lb (110.7 kg)  Height: 5\' 7"  (1.702 m)    Constitutional: well appearing. no distress. Appears well nourished.  Neurologic: CN intact. no focal findings. no sensory loss. Psychiatric:  Mood and affect symmetric and appropriate. Eyes:  No icterus. No conjunctival pallor. Ears, nose, throat:  mucous membranes moist. Midline trachea.  Cardiac: regular rate and rhythm.  Respiratory:  unlabored. Abdominal:  soft,  non-tender, non-distended.  Peripheral vascular: absent DP bilaterally. Triphasic PT flow bilaterally. Focal area of reticular veins over dorsum of left foot in area of patient concern. Extremity: no edema. no cyanosis. no pallor.  Skin: no gangrene. no ulceration.  Lymphatic: no Stemmer's sign. no palpable lymphadenopathy.  PERTINENT LABORATORY AND RADIOLOGIC DATA  Most recent CBC CBC Latest Ref Rng & Units 05/17/2020 04/05/2018 03/19/2018  WBC 4.0 - 10.5 K/uL 7.4 6.3 7.4  Hemoglobin 12.0 - 15.0 g/dL 15.4(H) 15.5(H) 16.1(H)  Hematocrit 36.0 - 46.0 % 45.1 46.2(H) 47.4(H)  Platelets 150.0 - 400.0 K/uL 252.0 247 287.0     Most recent CMP CMP Latest Ref Rng & Units 05/17/2020 05/01/2018 03/19/2018  Glucose 70 - 99 mg/dL 86 - 91  BUN 6 - 23 mg/dL 21 - 24(H)  Creatinine 0.40 - 1.20 mg/dL 1.01 - 0.79  Sodium 135 - 145 mEq/L 141 - 141  Potassium 3.5 - 5.1 mEq/L 4.2 - 4.1  Chloride 96 - 112 mEq/L 104 - 104  CO2 19 - 32 mEq/L 31 - 31  Calcium 8.4 - 10.5 mg/dL 9.7 - 9.7  Total Protein 6.0 - 8.3 g/dL 6.8 7.1 7.2  Total Bilirubin 0.2 - 1.2 mg/dL 0.8 1.1 0.8  Alkaline Phos 39 - 117 U/L 80 72 84  AST 0 - 37 U/L 15 19 14   ALT 0 - 35 U/L 18 20 15     Lower Venous Reflux Study   Patient Name:  Caitlin Moran  Date  of Exam:   12/07/2020  Medical Rec #: [id number]          Accession #:    [id number]  Date of Birth: [birth year]         Patient Gender: F  Patient Age:   41 years  Exam Location:  [facility]  Procedure:      VAS [place] LOWER EXTREMITY VENOUS REFLUX  Referring Phys: Jamelle Haring    ---------------------------------------------------------------------------  -----     Indications: Swelling, and varicosities.      Risk Factors: History of right lower extremity venous ablation.   Comparison Study: 12/10/2017: Lt- Reflux noted in the SFJ and GSV (mid  thigh and                    knee level).   Performing Technologist: Ivan Croft      Examination Guidelines: A  complete evaluation includes B-mode imaging,  spectral  Doppler, color Doppler, and power Doppler as needed of all accessible  portions  of each vessel. Bilateral testing is considered an integral part of a  complete  examination. Limited examinations for reoccurring indications may be  performed  as noted. The reflux portion of the exam is performed with the patient in  reverse Trendelenburg.  Significant venous reflux is defined as >500 ms in the superficial venous  system, and >1 second in the deep venous system.      +--------------+---------+------+-----------+------------+--------+  LEFT          Reflux NoRefluxReflux TimeDiameter cmsComments                          Yes                                   +--------------+---------+------+-----------+------------+--------+  CFV                     yes   >1 second                       +--------------+---------+------+-----------+------------+--------+  FV mid                  yes   >1 second                       +--------------+---------+------+-----------+------------+--------+  Popliteal               yes   >1 second                       +--------------+---------+------+-----------+------------+--------+  GSV at SFJ              yes    >500 ms      0.82              +--------------+---------+------+-----------+------------+--------+  GSV prox thighno                            0.38              +--------------+---------+------+-----------+------------+--------+  GSV mid thigh no                            0.35              +--------------+---------+------+-----------+------------+--------+  GSV dist thighno                            0.33              +--------------+---------+------+-----------+------------+--------+  GSV at knee             yes    >500 ms      0.33              +--------------+---------+------+-----------+------------+--------+  GSV  prox calf           yes    >500 ms      0.28              +--------------+---------+------+-----------+------------+--------+  SSV Pop Fossa no                            0.23              +--------------+---------+------+-----------+------------+--------+  SSV prox calf no                            0.22              +--------------+---------+------+-----------+------------+--------+  SSV mid calf  no                            0.35              +--------------+---------+------+-----------+------------+--------+          Summary:  Left:  - No evidence of deep vein thrombosis seen in the left lower extremity,  from the common femoral through the popliteal veins.  - No evidence of superficial venous thrombosis in the left lower  extremity.     - Venous reflux is noted in the left common femoral vein.  - Venous reflux is noted in the left sapheno-femoral junction.  - Venous reflux is noted in the left greater saphenous vein at the knee  and in the calf.  - Venous reflux is noted in the left femoral vein.  - Venous reflux is noted in the left popliteal vein.     *See table(s) above for measurements and observations.   Yevonne Aline. Stanford Breed, MD [facility] Phone Number: [phone] 12/07/2020 2:35 PM  Total time spent on preparing this encounter including chart review, data review, collecting history, examining the patient, coordinating care for this established patient, 40 minutes.  Portions of this report may have been transcribed using voice recognition software.  Every effort has been made to ensure accuracy; however, inadvertent computerized transcription errors may still be present.

## 2020-12-08 LAB — CULTURE, URINE COMPREHENSIVE

## 2020-12-09 ENCOUNTER — Telehealth: Payer: Self-pay

## 2020-12-09 NOTE — Telephone Encounter (Signed)
[facility] notifying patient. Asked patient to return call to book cysto appt.

## 2020-12-09 NOTE — Telephone Encounter (Signed)
-----   Message from [place], [place] sent at 12/09/2020  3:33 PM EDT ----- Urine culture negative for infection, she may stop antibiotics if she has any doses remaining. This does not explain the small amount of blood seen in her urine. I'd like her to undergo cystoscopy with Dr. Erlene Quan for further evaluation of this. ----- Message ----- From: Interface, [facility] Lab Results In Sent: 12/03/2020   4:37 PM EDT To: Debroah Loop, PA-C

## 2020-12-14 ENCOUNTER — Other Ambulatory Visit: Payer: Self-pay

## 2020-12-14 ENCOUNTER — Encounter: Payer: Self-pay | Admitting: Physical Therapy

## 2020-12-14 ENCOUNTER — Ambulatory Visit: Payer: PRIVATE HEALTH INSURANCE | Attending: Podiatry | Admitting: Physical Therapy

## 2020-12-14 DIAGNOSIS — M6281 Muscle weakness (generalized): Secondary | ICD-10-CM | POA: Diagnosis present

## 2020-12-14 DIAGNOSIS — R2681 Unsteadiness on feet: Secondary | ICD-10-CM | POA: Insufficient documentation

## 2020-12-14 DIAGNOSIS — M25671 Stiffness of right ankle, not elsewhere classified: Secondary | ICD-10-CM | POA: Diagnosis present

## 2020-12-14 DIAGNOSIS — M25571 Pain in right ankle and joints of right foot: Secondary | ICD-10-CM | POA: Insufficient documentation

## 2020-12-14 NOTE — Therapy (Signed)
[facility] [place], [place], [address] Phone: [phone]   Fax:  [phone]  Physical Therapy Treatment  Patient Details  Name: Caitlin Moran MRN: [id number] Date of Birth: 14-Oct-1960 Referring Provider (PT): Evelina Bucy   Encounter Date: 12/14/2020   PT End of Session - 12/14/20 1747     Visit Number 7    Number of Visits 17    Date for PT Re-Evaluation 12/22/20    Authorization Type BAS (Private)    Authorization Time Period FOTO v6, 10    Progress Note Due on Visit 10    PT Start Time 1745    PT Stop Time 1830    PT Time Calculation (min) 45 min    Activity Tolerance Patient tolerated treatment well    Behavior During Therapy [facility] for tasks assessed/performed             Past Medical History:  Diagnosis Date   Anxiety    Arthritis    neck   Depression    GERD (gastroesophageal reflux disease)    Headache    History of kidney stones    kidney stone -lithrotrispy 04/30/18- 2 current - stable   IBS (irritable bowel syndrome)    OSA (obstructive sleep apnea)    on CPAP   Polycythemia vera (HCC)    PONV (postoperative nausea and vomiting)    Didn't go to sleep all the way  during colonoscopy.   Renal calculus, right    Right ureteral stone    Spinal stenosis     Past Surgical History:  Procedure Laterality Date   ABLATION     unquestionable   BIOPSY  05/01/2018   Procedure: BIOPSY;  Surgeon: Rush Landmark Telford Nab., MD;  Location: [place];  Service: Gastroenterology;;   BIOPSY  05/24/2020   Procedure: BIOPSY;  Surgeon: Irving Copas., MD;  Location: [facility];  Service: Gastroenterology;;   CESAREAN SECTION     CHOLECYSTECTOMY     COLONOSCOPY     COLONOSCOPY WITH PROPOFOL N/A 05/24/2020   Procedure: COLONOSCOPY WITH PROPOFOL;  Surgeon: Irving Copas., MD;  Location: [facility];  Service: Gastroenterology;  Laterality: N/A;   ESOPHAGOGASTRODUODENOSCOPY      ESOPHAGOGASTRODUODENOSCOPY N/A 05/01/2018   Procedure: ESOPHAGOGASTRODUODENOSCOPY (EGD);  Surgeon: Irving Copas., MD;  Location: [place];  Service: Gastroenterology;  Laterality: N/A;   ESOPHAGOGASTRODUODENOSCOPY (EGD) WITH PROPOFOL N/A 05/24/2020   Procedure: ESOPHAGOGASTRODUODENOSCOPY (EGD) WITH PROPOFOL;  Surgeon: Rush Landmark Telford Nab., MD;  Location: [facility];  Service: Gastroenterology;  Laterality: N/A;   EUS N/A 05/01/2018   Procedure: UPPER ENDOSCOPIC ULTRASOUND (EUS) RADIAL;  Surgeon: Irving Copas., MD;  Location: [place];  Service: Gastroenterology;  Laterality: N/A;   EXTRACORPOREAL SHOCK WAVE LITHOTRIPSY  02/14/2010   HYSTEROSCOPY WITH D & C N/A 11/27/2012   Procedure: DILATATION AND CURETTAGE /HYSTEROSCOPY;  Surgeon: Marylynn Pearson, MD;  Location: [facility];  Service: Gynecology;  Laterality: N/A;   SUBMUCOSAL LIFTING INJECTION  05/24/2020   Procedure: SUBMUCOSAL LIFTING INJECTION;  Surgeon: Rush Landmark Telford Nab., MD;  Location: [facility];  Service: Gastroenterology;;   WISDOM TOOTH EXTRACTION      There were no vitals filed for this visit.   Subjective Assessment - 12/14/20 1751     Subjective Pt reports that her R heel is feeling somewhat better (2-3/10)  Her L foot pain is (4-5/10).  She reports compliance with HEP.  [place] Adult PT Treatment/Exercise:  Therapeutic Exercise: - bike - L4 - 76min - Heel raise with eccentric lower on 4'' step (W/S to R) - 3x10 - Hip abduction machine 3x10 ea - @25 # - leg press - 3x10 at 55#  Neuromuscular re-ed: - tandem stance - 3x30'' ea - rocker board DF/PF - 20x   Modalities  Modalities Iontophoresis      Iontophoresis  Type of Iontophoresis Dexamethasone   Location R calcaneus   Dose 2 ML   Time 4 hour       PT Short Term Goals - 11/04/20 1830       PT SHORT TERM GOAL #1   Title Pt will reports understanding and adherence of her HEP in order to promote  independence in the management of her primary impairments.    Baseline Pt reports adherence to HEP.    Time 4    Period Weeks    Status Achieved    Target Date 11/24/20               PT Long Term Goals - 12/02/20 1748       PT LONG TERM GOAL #1   Title Pt will achieve a FOTO score of 61% in order to demonstrate improved functional ability in regard to her heel pain.    Baseline 47%, 55% (12/02/2020)    Time 8    Period Weeks    Status On-going      PT LONG TERM GOAL #2   Title Pt will demonstrate ability to perform 15 SL heel raises with good form on the R in order to demonstrate functional PF strength to stand on toes when reaching into overhead cabinets.    Baseline 5 SL heel raises on R with pain    Time 8    Period Weeks    Status On-going      PT LONG TERM GOAL #3   Title Pt will report ability to walk >30 minutes in order to grocery shop without limitation due to pain.    Baseline Pt unable to walk longer than 15-20 minutes due to pain.    Time 8    Period Weeks    Status On-going      PT LONG TERM GOAL #4   Title Pt will demonstrate R DF AROM =/> 5 degrees in order to clear foot during walking without limitation.    Baseline -5 degrees on R    Time 8    Period Weeks    Status On-going      PT LONG TERM GOAL #5   Title Pt will achieve global BIL hip strength >/= 4+/5 in order to progress independent LE strengthening regimen without limitation.    Baseline global BIL hip strength ranging from 3/5 to 4/5    Time 8    Period Weeks    Status On-going                   Plan - 12/14/20 1804     Clinical Impression Statement Pt reports no increase in baseline pain following therapy  HEP was reviewed, but left unchanged    Overall, Caitlin Moran is progressing well with therapy.  Today we concentrated on ankle strengthening and balance/proprioception.  Pt pt shows deficit with R foot in rear with tandem stance.  Tolerates therex well with minimal  increase in sxs during exercises, which dissipate to baseline with rest.  Cued for form and pacing.  Pt will continue  to benefit from skilled physical therapy to address remaining deficits and achieve listed goals.  Continue per POC.    Personal Factors and Comorbidities Comorbidity 3+;Fitness    Comorbidities See medical Hx    Examination-Activity Limitations Locomotion Level;Stairs    Examination-Participation Restrictions Occupation;Community Activity;Shop    Stability/Clinical Decision Making Stable/Uncomplicated    Rehab Potential Good    PT Frequency 2x / week    PT Duration 8 weeks    PT Treatment/Interventions ADLs/Self Care Home Management;Biofeedback;Cryotherapy;Electrical Stimulation;Iontophoresis 4mg /ml Dexamethasone;Moist Heat;Gait training;Stair training;Functional mobility training;Therapeutic activities;Therapeutic exercise;Balance training;Neuromuscular re-education;Manual techniques;Joint Manipulations;Passive range of motion;Patient/family education;Taping;Vasopneumatic Device    PT Next Visit Plan Progress LE strengthening program with focus on PF strength and introduce hip strengthening, progress to stair/ gait training    PT Home Exercise Plan [id number]    Consulted and Agree with Plan of Care Patient             Patient will benefit from skilled therapeutic intervention in order to improve the following deficits and impairments:  Abnormal gait, Decreased range of motion, Difficulty walking, Obesity, Pain, Impaired flexibility, Decreased balance, Decreased strength  Visit Diagnosis: Pain in right ankle and joints of right foot  Stiffness of right ankle, not elsewhere classified  Muscle weakness (generalized)  Unsteadiness on feet     Problem List Patient Active Problem List   Diagnosis Date Noted   DDD (degenerative disc disease), cervical 07/25/2018   Cervical radiculopathy 06/19/2018   High hematocrit without dehydration 05/09/2018   Snoring 05/09/2018    Sleep apnea in adult 05/09/2018   Excessive daytime sleepiness 05/09/2018   Elevated hemoglobin ([place]) 05/09/2018   History of colonic polyps 03/21/2018   Hemorrhoids 03/21/2018   Rectal bleeding 03/21/2018   RUQ pain 03/21/2018   GERD (gastroesophageal reflux disease) 11/06/2017   Hyperlipidemia 11/06/2017   Varicose veins of leg with pain, bilateral 11/06/2017   Left shoulder pain 01/28/2013   Proximal humerus fracture 01/28/2013    Mathis Dad, PT 12/14/2020, 6:11 PM  [facility] [address] [place], [place], [address] Phone: [phone]   Fax:  [phone]  Name: Caitlin Moran MRN: [id number] Date of Birth: 02-Sep-1960

## 2020-12-21 ENCOUNTER — Other Ambulatory Visit: Payer: Self-pay

## 2020-12-21 ENCOUNTER — Ambulatory Visit: Payer: PRIVATE HEALTH INSURANCE

## 2020-12-21 DIAGNOSIS — M25671 Stiffness of right ankle, not elsewhere classified: Secondary | ICD-10-CM

## 2020-12-21 DIAGNOSIS — M25571 Pain in right ankle and joints of right foot: Secondary | ICD-10-CM

## 2020-12-21 DIAGNOSIS — R2681 Unsteadiness on feet: Secondary | ICD-10-CM

## 2020-12-21 DIAGNOSIS — M6281 Muscle weakness (generalized): Secondary | ICD-10-CM

## 2020-12-21 NOTE — Patient Instructions (Signed)
Pt instructed to independently progress HEP.

## 2020-12-21 NOTE — Therapy (Signed)
[place], [place], [address] Phone: [phone]   Fax:  [phone]  Physical Therapy Treatment/ Discharge Summary  Patient Details  Name: Caitlin Moran MRN: [id number] Date of Birth: 1960-03-28 Referring Provider (PT): Evelina Bucy   Encounter Date: 12/21/2020   PT End of Session - 12/21/20 1746     Visit Number 8    Number of Visits 17    Date for PT Re-Evaluation 12/22/20    Authorization Type BAS (Private)    Authorization Time Period FOTO v6, 10    Progress Note Due on Visit 10    PT Start Time 3428   Pt arrived 15 minutes late to her appointment.   PT Stop Time 1745    PT Time Calculation (min) 30 min    Activity Tolerance Patient tolerated treatment well    Behavior During Therapy WFL for tasks assessed/performed             Past Medical History:  Diagnosis Date   Anxiety    Arthritis    neck   Depression    GERD (gastroesophageal reflux disease)    Headache    History of kidney stones    kidney stone -lithrotrispy 04/30/18- 2 current - stable   IBS (irritable bowel syndrome)    OSA (obstructive sleep apnea)    on CPAP   Polycythemia vera (HCC)    PONV (postoperative nausea and vomiting)    Didn't go to sleep all the way  during colonoscopy.   Renal calculus, right    Right ureteral stone    Spinal stenosis     Past Surgical History:  Procedure Laterality Date   ABLATION     unquestionable   BIOPSY  05/01/2018   Procedure: BIOPSY;  Surgeon: Rush Landmark Telford Nab., MD;  Location: [facility];  Service: Gastroenterology;;   BIOPSY  05/24/2020   Procedure: BIOPSY;  Surgeon: Irving Copas., MD;  Location: [facility];  Service: Gastroenterology;;   CESAREAN SECTION     CHOLECYSTECTOMY     COLONOSCOPY     COLONOSCOPY WITH PROPOFOL N/A 05/24/2020   Procedure: COLONOSCOPY WITH PROPOFOL;  Surgeon: Irving Copas., MD;  Location: [facility];  Service:  Gastroenterology;  Laterality: N/A;   ESOPHAGOGASTRODUODENOSCOPY     ESOPHAGOGASTRODUODENOSCOPY N/A 05/01/2018   Procedure: ESOPHAGOGASTRODUODENOSCOPY (EGD);  Surgeon: Irving Copas., MD;  Location: [facility];  Service: Gastroenterology;  Laterality: N/A;   ESOPHAGOGASTRODUODENOSCOPY (EGD) WITH PROPOFOL N/A 05/24/2020   Procedure: ESOPHAGOGASTRODUODENOSCOPY (EGD) WITH PROPOFOL;  Surgeon: Rush Landmark Telford Nab., MD;  Location: [facility];  Service: Gastroenterology;  Laterality: N/A;   EUS N/A 05/01/2018   Procedure: UPPER ENDOSCOPIC ULTRASOUND (EUS) RADIAL;  Surgeon: Irving Copas., MD;  Location: [facility];  Service: Gastroenterology;  Laterality: N/A;   EXTRACORPOREAL SHOCK WAVE LITHOTRIPSY  02/14/2010   HYSTEROSCOPY WITH D & C N/A 11/27/2012   Procedure: DILATATION AND CURETTAGE /HYSTEROSCOPY;  Surgeon: Marylynn Pearson, MD;  Location: [facility];  Service: Gynecology;  Laterality: N/A;   SUBMUCOSAL LIFTING INJECTION  05/24/2020   Procedure: SUBMUCOSAL LIFTING INJECTION;  Surgeon: Rush Landmark Telford Nab., MD;  Location: [facility];  Service: Gastroenterology;;   WISDOM TOOTH EXTRACTION      There were no vitals filed for this visit.   Subjective Assessment - 12/21/20 1713     Subjective Pt reports that her R heel pain is better overall, although it is still "up and down." She reports continued HEP adherence and states her exercises  have been going well. She adds that she feels independent in the management of her symptoms moving forward and is ready to be discharged from PT at this time.    How long can you walk comfortably? 30 minutes    Currently in Pain? Yes    Pain Score 2     Pain Location Heel    Pain Orientation Right    Pain Descriptors / Indicators Aching    Pain Type Chronic pain                [facility] PT Assessment - 12/21/20 0001       Observation/Other Assessments   Focus on Therapeutic Outcomes (FOTO)  65%      Other:   Other/Comments x15  on R with increase in pain to 3/10      AROM   Right Ankle Dorsiflexion 2      PROM   Right Ankle Dorsiflexion 7      Strength   Right Hip Flexion 4+/5    Right Hip Extension 4/5    Right Hip ABduction 4+/5    Left Hip Flexion 4+/5    Left Hip Extension 4/5    Left Hip ABduction 4+/5    Right Ankle Plantar Flexion 5/5                           [facility] Adult PT Treatment/Exercise - 12/21/20 0001       Modalities   Modalities Iontophoresis      Iontophoresis   Type of Iontophoresis Dexamethasone    Location R calcaneus    Dose 2 ML    Time 4 hour      Ankle Exercises: Standing   Heel Raises Other (comment)   Bouncing eccentric heel raises on 2-inch step 3x30 seconds     Ankle Exercises: Stretches   Gastroc Stretch 60 seconds;2 reps                     PT Education - 12/21/20 1746     Education Details Educated on objective progress made through PT so far, as well as ways to progress her HEP.    Person(s) Educated Patient    Methods Explanation;Demonstration;Handout    Comprehension Verbalized understanding;Returned demonstration              PT Short Term Goals - 11/04/20 1830       PT SHORT TERM GOAL #1   Title Pt will reports understanding and adherence of her HEP in order to promote independence in the management of her primary impairments.    Baseline Pt reports adherence to HEP.    Time 4    Period Weeks    Status Achieved    Target Date 11/24/20               PT Long Term Goals - 12/21/20 1749       PT LONG TERM GOAL #1   Title Pt will achieve a FOTO score of 61% in order to demonstrate improved functional ability in regard to her heel pain.    Baseline 47%, 55% (12/02/2020), 65% (12/21/2020)    Time 8    Period Weeks    Status Achieved      PT LONG TERM GOAL #2   Title Pt will demonstrate ability to perform 15 SL heel raises with good form on the R in order to demonstrate functional PF strength to stand on  toes when reaching into overhead cabinets.    Baseline 5 SL heel raises on R with pain, 15 heel raises with good form (12/21/2020)    Time 8    Period Weeks    Status Achieved      PT LONG TERM GOAL #3   Title Pt will report ability to walk >30 minutes in order to grocery shop without limitation due to pain.    Baseline Pt unable to walk longer than 15-20 minutes due to pain; 30 minutes (12/21/2020)    Time 8    Period Weeks    Status Achieved      PT LONG TERM GOAL #4   Title Pt will demonstrate R DF AROM =/> 5 degrees in order to clear foot during walking without limitation.    Baseline -5 degrees on R; 0 degrees (12/21/2020)    Time 8    Period Weeks    Status Partially Met      PT LONG TERM GOAL #5   Title Pt will achieve global BIL hip strength >/= 4+/5 in order to progress independent LE strengthening regimen without limitation.    Baseline global BIL hip strength ranging from 4/5 to 4+/5    Time 8    Period Weeks    Status Partially Met                   Plan - 12/21/20 1747     Clinical Impression Statement Upon reassessment of objective measures, the pt has made excellent improvement in R ankle DF AROM, hip strength, functional walking ability, and FOTO score. She has met all of her goals except for her DF AROM goal, which she is only 5 degrees shy of meeting. Due to excellent progress made in PT, along with pt report of feeling ready for discharge, she is discharged from PT at this time to independently progress her HEP.    PT Next Visit Plan Pt is discharged from PT at this time.    PT Home Exercise Plan [id number]    Consulted and Agree with Plan of Care Patient             Patient will benefit from skilled therapeutic intervention in order to improve the following deficits and impairments:     Visit Diagnosis: Pain in right ankle and joints of right foot  Stiffness of right ankle, not elsewhere classified  Muscle weakness  (generalized)  Unsteadiness on feet     Problem List Patient Active Problem List   Diagnosis Date Noted   DDD (degenerative disc disease), cervical 07/25/2018   Cervical radiculopathy 06/19/2018   High hematocrit without dehydration 05/09/2018   Snoring 05/09/2018   Sleep apnea in adult 05/09/2018   Excessive daytime sleepiness 05/09/2018   Elevated hemoglobin ([place]) 05/09/2018   History of colonic polyps 03/21/2018   Hemorrhoids 03/21/2018   Rectal bleeding 03/21/2018   RUQ pain 03/21/2018   GERD (gastroesophageal reflux disease) 11/06/2017   Hyperlipidemia 11/06/2017   Varicose veins of leg with pain, bilateral 11/06/2017   Left shoulder pain 01/28/2013   Proximal humerus fracture 01/28/2013     [place], [place], [address] Phone: [phone]   Fax:  [phone]  Name: Caitlin Moran MRN: [id number] Date of Birth: June 21, 1960  PHYSICAL THERAPY DISCHARGE SUMMARY  Visits from Start of Care: 8  Current functional level related to goals / functional outcomes: Pt has met or mostly met her stated rehab goals.  Remaining deficits: Pt remains mildly limited in R ankle DF AROM and BIL hip strength, although she has made good progress in each of these measures.   Education / Equipment: HEP   Patient agrees to discharge. Patient goals were met. Patient is being discharged due to meeting the stated rehab goals.  Vanessa , PT, DPT 12/21/20 5:57 PM

## 2020-12-23 ENCOUNTER — Ambulatory Visit: Payer: PRIVATE HEALTH INSURANCE

## 2020-12-23 NOTE — Progress Notes (Signed)
PATIENT: Caitlin Moran DOB: 01-Oct-1960  REASON FOR VISIT: follow up HISTORY FROM: patient  Chief Complaint  Patient presents with   Obstructive Sleep Apnea    Rm 1, alone. Here for 6 month CPAP f/u. No issues or concerns today.       HISTORY OF PRESENT ILLNESS: 12/27/20 ALL: Caitlin Moran returns for CPAP follow up. She continues to do well on therapy. She had noticed more leaking with nasal mask and has returned to using FFM. She is working on improving four hour compliance. She is not certain she feels any better on CPAP therapy but recognizes health benefits of management.     06/29/20 ALL:  Caitlin Moran is a 60 y.o. female here today for follow up for OSA on CPAP. She continues to struggle with 4 hour compliance. She denies concerns with machine or supplies. New mask did not seem to help with 4 hour compliance. Leak has improved. She takes her mask off during the night at some point and does not replace. She is uncertain why she is removing the mask. She does report hearing noises from the machine that seem to wake her.       12/29/2019 ALL: Caitlin Moran is a 60 y.o. female here today for follow up for OSA on CPAP.  She admits that she continues to struggle with 4-hour compliance.  She reports that she will wake up in the middle of the night and is unable to get back to sleep because she can hear the machine running.  She is currently using a full facemask and has noted a leak.  She is interested in trying a new mask.  She is motivated to continue using CPAP therapy.  She is uncertain if she has noted significant benefit but knows risk of untreated sleep apnea.  Compliance report dated 11/17/2019 through 12/16/2019 reveals that she used CPAP 23 of the past 30 days for compliance of 77%.  She is CPAP greater than 4 hours 8 of the past 30 days for compliance of 27%.  Average usage on days used was 3 hours and 27 minutes.  Residual AHI was 2.0 on 6 to 16 cm of water and an EPR of  3.  There was a significant leak noted in the 95th percentile of 37.1 L/min.   HISTORY: (copied from my note on 06/26/2019)  Caitlin Moran is a 60 y.o. female here today for follow up for OSA on CPAP therapy.  She reports that she is doing very well.  She has no difficulty with her machine or supplies.  She does admit that compliance remains her biggest concern.  She just cannot seem to get in the habit of using her machine every night.  When she does use it she typically takes her mask off in the middle the night.  She feels that this is simply related to being inconsistent with these.   Compliance report dated 03/27/2019 through 06/24/2019 reveals that she used CPAP therapy 36 of the past 90 nights for compliance of 40%.  She used CPAP greater than 4 hours 9 of the past 90 nights for compliance of 10%.  Average usage on days used was 3 hours and 21 minutes.  Residual AHI was 3.7 on 6 to 16 cm of water and an EPR of 3.  There was no significant leak noted.   HISTORY: (copied from my note on 01/29/2019)   Caitlin Moran is a 60 y.o. female here today for  follow up. She was recently diagnosed with severe OSA and started on CPAP. She was found to have hypoxemia that may be cause of rising H&H. She is trying to adapt to CPAP therapy. She is usually able to go to sleep without difficulty but then wakes to a loud humming sound. She will take her mask off for a couple of seconds and replace. Humming sound goes away. Otherwise she is doing well.    Compliance report dated 12/29/2018 through 01/27/2019 reveals that she used CPAP 22 of the last 30 days for compliance of 73%.  She used CPAP greater than 4 hours of 8 of the last 30 days for compliance of 27%.  Average usage was 3 hours and 12 minutes.  AHI was 2.8 on 6 to 16 cm of water and an EPR of 3.  There was no significant leak noted.   REVIEW OF SYSTEMS: Out of a complete 14 system review of symptoms, the patient complains only of the following  symptoms, none and all other reviewed systems are negative.  ESS: 5  ALLERGIES: No Known Allergies  HOME MEDICATIONS: Outpatient Medications Prior to Visit  Medication Sig Dispense Refill   aspirin EC 81 MG tablet Take 81 mg by mouth daily.     Calcium Carb-Cholecalciferol (CALCIUM-VITAMIN D) 600-400 MG-UNIT TABS Take 1 tablet by mouth daily.     celecoxib (CELEBREX) 200 MG capsule Take 200 mg by mouth daily.      Cholecalciferol (VITAMIN D) 50 MCG (2000 UT) CAPS Take 2,000 Units by mouth daily.      clobetasol cream (TEMOVATE) 3.84 % Apply 1 application topically daily as needed (for psoriasis).      fish oil-omega-3 fatty acids 1000 MG capsule Take 1 g by mouth daily.      Golimumab 50 MG/0.5ML SOAJ Inject 50 mg into the skin every 30 (thirty) days.      HYDROcodone-acetaminophen (NORCO) 7.5-325 MG tablet Take 1 tablet by mouth every 6 (six) hours as needed for moderate pain.  0   omeprazole (PRILOSEC) 40 MG capsule Take 1 capsule (40 mg total) by mouth 2 (two) times daily before a meal. 60 capsule 0   Probiotic Product (PROBIOTIC PO) Take 1 capsule by mouth daily.     rosuvastatin (CRESTOR) 5 MG tablet Take 5 mg by mouth daily.     tiZANidine (ZANAFLEX) 2 MG tablet Take 2 mg by mouth 3 (three) times daily as needed.     Turmeric Curcumin 500 MG CAPS See admin instructions.     vortioxetine HBr (TRINTELLIX) 20 MG TABS tablet Take 20 mg by mouth daily.     No facility-administered medications prior to visit.    PAST MEDICAL HISTORY: Past Medical History:  Diagnosis Date   Anxiety    Arthritis    neck   Depression    GERD (gastroesophageal reflux disease)    Headache    History of kidney stones    kidney stone -lithrotrispy 04/30/18- 2 current - stable   IBS (irritable bowel syndrome)    OSA (obstructive sleep apnea)    on CPAP   Polycythemia vera (HCC)    PONV (postoperative nausea and vomiting)    Didn't go to sleep all the way  during colonoscopy.   Renal calculus,  right    Right ureteral stone    Spinal stenosis     PAST SURGICAL HISTORY: Past Surgical History:  Procedure Laterality Date   ABLATION     unquestionable   BIOPSY  05/01/2018   Procedure: BIOPSY;  Surgeon: Irving Copas., MD;  Location: [facility];  Service: Gastroenterology;;   BIOPSY  05/24/2020   Procedure: BIOPSY;  Surgeon: Irving Copas., MD;  Location: [facility];  Service: Gastroenterology;;   CESAREAN SECTION     CHOLECYSTECTOMY     COLONOSCOPY     COLONOSCOPY WITH PROPOFOL N/A 05/24/2020   Procedure: COLONOSCOPY WITH PROPOFOL;  Surgeon: Irving Copas., MD;  Location: [facility];  Service: Gastroenterology;  Laterality: N/A;   ESOPHAGOGASTRODUODENOSCOPY     ESOPHAGOGASTRODUODENOSCOPY N/A 05/01/2018   Procedure: ESOPHAGOGASTRODUODENOSCOPY (EGD);  Surgeon: Irving Copas., MD;  Location: [facility];  Service: Gastroenterology;  Laterality: N/A;   ESOPHAGOGASTRODUODENOSCOPY (EGD) WITH PROPOFOL N/A 05/24/2020   Procedure: ESOPHAGOGASTRODUODENOSCOPY (EGD) WITH PROPOFOL;  Surgeon: Rush Landmark Telford Nab., MD;  Location: [facility];  Service: Gastroenterology;  Laterality: N/A;   EUS N/A 05/01/2018   Procedure: UPPER ENDOSCOPIC ULTRASOUND (EUS) RADIAL;  Surgeon: Irving Copas., MD;  Location: [facility];  Service: Gastroenterology;  Laterality: N/A;   EXTRACORPOREAL SHOCK WAVE LITHOTRIPSY  02/14/2010   HYSTEROSCOPY WITH D & C N/A 11/27/2012   Procedure: DILATATION AND CURETTAGE /HYSTEROSCOPY;  Surgeon: Marylynn Pearson, MD;  Location: [facility];  Service: Gynecology;  Laterality: N/A;   SUBMUCOSAL LIFTING INJECTION  05/24/2020   Procedure: SUBMUCOSAL LIFTING INJECTION;  Surgeon: Rush Landmark Telford Nab., MD;  Location: [facility];  Service: Gastroenterology;;   WISDOM TOOTH EXTRACTION      FAMILY HISTORY: Family History  Problem Relation Age of Onset   Breast cancer Maternal Aunt    Esophageal cancer Mother    Lung cancer Father     CAD Father    Colon cancer Neg Hx    Inflammatory bowel disease Neg Hx    Liver disease Neg Hx    Pancreatic cancer Neg Hx    Stomach cancer Neg Hx    Rectal cancer Neg Hx     SOCIAL HISTORY: Social History   Socioeconomic History   Marital status: Married    Spouse name: Not on file   Number of children: 2   Years of education: Not on file   Highest education level: Not on file  Occupational History   Occupation: RN  Tobacco Use   Smoking status: Former    Types: Cigarettes    Quit date: 11/05/1990    Years since quitting: 30.1   Smokeless tobacco: Never  Vaping Use   Vaping Use: Never used  Substance and Sexual Activity   Alcohol use: Yes    Alcohol/week: 7.0 standard drinks    Types: 7 Glasses of wine per week    Comment: 5   Drug use: No   Sexual activity: Not on file  Other Topics Concern   Not on file  Social History Narrative   Not on file   Social Determinants of Health   Financial Resource Strain: Not on file  Food Insecurity: Not on file  Transportation Needs: Not on file  Physical Activity: Not on file  Stress: Not on file  Social Connections: Not on file  Intimate Partner Violence: Not on file     PHYSICAL EXAM  Vitals:   12/27/20 0800  BP: 134/81  Pulse: 65  Weight: 250 lb (113.4 kg)  Height: 5\' 7"  (1.702 m)    Body mass index is 39.16 kg/m.  Generalized: Well developed, in no acute distress  Cardiology: normal rate and rhythm, no murmur noted Respiratory: clear to auscultation bilaterally  Neurological examination  Mentation:  Alert oriented to time, place, history taking. Follows all commands speech and language fluent Cranial nerve II-XII: Pupils were equal round reactive to light. Extraocular movements were full, visual field were full  Motor: The motor testing reveals 5 over 5 strength of all 4 extremities. Good symmetric motor tone is noted throughout.  Gait and station: Gait is normal.    DIAGNOSTIC DATA (LABS, IMAGING,  TESTING) - I reviewed patient records, labs, notes, testing and imaging myself where available.  No flowsheet data found.   Lab Results  Component Value Date   WBC 7.4 05/17/2020   HGB 15.4 (H) 05/17/2020   HCT 45.1 05/17/2020   MCV 89.2 05/17/2020   PLT 252.0 05/17/2020      Component Value Date/Time   NA 141 05/17/2020 1432   K 4.2 05/17/2020 1432   CL 104 05/17/2020 1432   CO2 31 05/17/2020 1432   GLUCOSE 86 05/17/2020 1432   BUN 21 05/17/2020 1432   CREATININE 1.01 05/17/2020 1432   CALCIUM 9.7 05/17/2020 1432   PROT 6.8 05/17/2020 1432   ALBUMIN 4.0 05/17/2020 1432   AST 15 05/17/2020 1432   ALT 18 05/17/2020 1432   ALKPHOS 80 05/17/2020 1432   BILITOT 0.8 05/17/2020 1432   GFRNONAA >60 10/16/2010 1600   GFRAA >60 10/16/2010 1600   No results found for: CHOL, HDL, LDLCALC, LDLDIRECT, TRIG, CHOLHDL No results found for: HGBA1C No results found for: VITAMINB12 No results found for: TSH   ASSESSMENT AND PLAN 60 y.o. year old female  has a past medical history of Anxiety, Arthritis, Depression, GERD (gastroesophageal reflux disease), Headache, History of kidney stones, IBS (irritable bowel syndrome), OSA (obstructive sleep apnea), Polycythemia vera ([place]), PONV (postoperative nausea and vomiting), Renal calculus, right, Right ureteral stone, and Spinal stenosis. here with     ICD-10-CM   1. OSA on CPAP  G47.33 For home use only DME continuous positive airway pressure (CPAP)   Z99.89       Caitlin Moran is doing fairly well on CPAP therapy.  Compliance report reveals acceptable daily usage with suboptimal 4-hour usage. Four hour compliance has improved from 7% to 44% over past 6 months. She feels that mask refitting may be beneficial to help with comfort.  I will send orders for a mask refitting and supplies today.  She was encouraged to continue using CPAP nightly and for greater than 4 hours each night. Risks of untreated sleep apnea review and education materials  provided. Healthy lifestyle habits encouraged. She will follow up in 6 months, sooner if needed. She verbalizes understanding and agreement with this plan.    Set up date 12/10/2018.   Orders Placed This Encounter  Procedures   For home use only DME continuous positive airway pressure (CPAP)    Supplies    Order Specific Question:   Length of Need    Answer:   Lifetime    Order Specific Question:   Patient has OSA or probable OSA    Answer:   Yes    Order Specific Question:   Is the patient currently using CPAP in the home    Answer:   Yes    Order Specific Question:   Settings    Answer:   Other see comments    Order Specific Question:   CPAP supplies needed    Answer:   Mask, headgear, cushions, filters, heated tubing and water chamber      No orders of the defined types were placed in  this encounter.    Debbora Presto, FNP-C 12/27/2020, 8:21 AM [facility] [address], [place], [place] [address] [phone]

## 2020-12-23 NOTE — Patient Instructions (Addendum)
Please continue using your CPAP regularly. While your insurance requires that you use CPAP at least 4 hours each night on 70% of the nights, I recommend, that you not skip any nights and use it throughout the night if you can. Getting used to CPAP and staying with the treatment long term does take time and patience and discipline. Untreated obstructive sleep apnea when it is moderate to severe can have an adverse impact on cardiovascular health and raise her risk for heart disease, arrhythmias, hypertension, congestive heart failure, stroke and diabetes. Untreated obstructive sleep apnea causes sleep disruption, nonrestorative sleep, and sleep deprivation. This can have an impact on your day to day functioning and cause daytime sleepiness and impairment of cognitive function, memory loss, mood disturbance, and problems focussing. Using CPAP regularly can improve these symptoms.  Follow up in 6 months.  

## 2020-12-27 ENCOUNTER — Other Ambulatory Visit: Payer: Self-pay

## 2020-12-27 ENCOUNTER — Encounter: Payer: Self-pay | Admitting: Family Medicine

## 2020-12-27 ENCOUNTER — Ambulatory Visit (INDEPENDENT_AMBULATORY_CARE_PROVIDER_SITE_OTHER): Payer: PRIVATE HEALTH INSURANCE | Admitting: Family Medicine

## 2020-12-27 VITALS — BP 134/81 | HR 65 | Ht 67.0 in | Wt 250.0 lb

## 2020-12-27 DIAGNOSIS — Z9989 Dependence on other enabling machines and devices: Secondary | ICD-10-CM

## 2020-12-27 DIAGNOSIS — G4733 Obstructive sleep apnea (adult) (pediatric): Secondary | ICD-10-CM

## 2020-12-28 ENCOUNTER — Ambulatory Visit (INDEPENDENT_AMBULATORY_CARE_PROVIDER_SITE_OTHER): Payer: PRIVATE HEALTH INSURANCE | Admitting: Podiatry

## 2020-12-28 ENCOUNTER — Other Ambulatory Visit: Payer: Self-pay

## 2020-12-28 DIAGNOSIS — M79672 Pain in left foot: Secondary | ICD-10-CM

## 2020-12-28 DIAGNOSIS — M7661 Achilles tendinitis, right leg: Secondary | ICD-10-CM

## 2020-12-28 DIAGNOSIS — M79671 Pain in right foot: Secondary | ICD-10-CM | POA: Diagnosis not present

## 2020-12-28 NOTE — Patient Instructions (Signed)
Recommend on [employer]:  Physiological scientist for Plantar Fasciitis, Achilles Tendonitis and Tight Calves

## 2021-01-04 NOTE — Progress Notes (Signed)
  Subjective:  Patient ID: Caitlin Moran, female    DOB: Jul 29, 1960,  MRN: [id number]  Chief Complaint  Patient presents with   Foot Pain    Follow up right foot tendonitis. Pt states PT has helped a little.    60 y.o. female presents with the above complaint. History confirmed with patient.  Objective:  Physical Exam: warm, good capillary refill, no trophic changes or ulcerative lesions, normal DP and PT pulses and normal sensory exam. Right Foot:  POP posterior heel with prominent haglund deformity, no pain at watershed region, decreased ankle joint ROM . Visible foreign body plantar lateral right foot.  Assessment:   1. Tendonitis, Achilles, right   2. Pain in both feet    Plan:  Patient was evaluated and treated and all questions answered.  Achilles Tendonitis -Improving, less likely we will need to consider surgery. Has not done much PT and has not done iontophoresis yet. -Continue voltaren gel  -Continue PT and iontophoresis -Should pain persist would consider microdebridement of the tendon to promote healing   Foreign body right heel -Resolved  No follow-ups on file.

## 2021-01-07 ENCOUNTER — Other Ambulatory Visit: Payer: PRIVATE HEALTH INSURANCE | Admitting: Urology

## 2021-02-17 ENCOUNTER — Other Ambulatory Visit: Payer: Self-pay | Admitting: Internal Medicine

## 2021-02-17 DIAGNOSIS — E785 Hyperlipidemia, unspecified: Secondary | ICD-10-CM

## 2021-03-15 NOTE — Progress Notes (Signed)
° °  03/16/21  CC:  Chief Complaint  Patient presents with   Cysto     HPI: Caitlin Moran is a 61 y.o.female with a personal history of nephrolithiasis, OSA, flank pain, and microscopic hematuria who presents today for diagnostic cystoscopy.   She underwent CT stone study on 10/28/2020, which revealed stable right lower pole stones with no evidence of hydronephrosis or ureteral stones.   She was last seen in office for flank pain on 12/03/2020 with Debroah Loop, PA-C. Urinalysis was suspicious at the time and she was started on empiric cefuroxime. KUB showed a stable 19 mm calculus overlying the expected lower pole of the right kidney with no urolithiasis.    Vitals:   03/16/21 1533  BP: 134/87  Pulse: 66  NED. A&Ox3.   No respiratory distress   Abd soft, NT, ND Normal external genitalia with patent urethral meatus  Cystoscopy Procedure Note  Patient identification was confirmed, informed consent was obtained, and patient was prepped using Betadine solution.  Lidocaine jelly was administered per urethral meatus.    Procedure: - Flexible cystoscope introduced, without any difficulty.   - Thorough search of the bladder revealed:    normal urethral meatus    normal urothelium    no stones    no ulcers     no tumors    no urethral polyps    no trabeculation  - Ureteral orifices were normal in position and appearance.  Post-Procedure: - Patient tolerated the procedure well  Assessment/ Plan:  Microscopic hematuria  - persistent with no clear explanation besides nonobstructing stones - cystoscopy today was unremarkable  - will follow up in 3 months with urinalysis if blood persists recommend CT urogram   2. Kidney stones  Non-obstructing and otherwise asymptomatic would not recommend any intervention for these    Return in about 3 months (around 06/14/2021) for UA.  Gevena Mart Littlejohn,acting as a scribe for Hollice Espy, MD.,have documented all relevant  documentation on the behalf of Hollice Espy, MD,as directed by  Hollice Espy, MD while in the presence of Hollice Espy, MD.  I have reviewed the above documentation for accuracy and completeness, and I agree with the above.   Hollice Espy, MD

## 2021-03-16 ENCOUNTER — Ambulatory Visit (INDEPENDENT_AMBULATORY_CARE_PROVIDER_SITE_OTHER): Payer: BC Managed Care – PPO | Admitting: Urology

## 2021-03-16 ENCOUNTER — Encounter: Payer: Self-pay | Admitting: Urology

## 2021-03-16 ENCOUNTER — Other Ambulatory Visit: Payer: Self-pay

## 2021-03-16 VITALS — BP 134/87 | HR 66

## 2021-03-16 DIAGNOSIS — R319 Hematuria, unspecified: Secondary | ICD-10-CM

## 2021-03-16 LAB — MICROSCOPIC EXAMINATION

## 2021-03-16 LAB — URINALYSIS, COMPLETE
Bilirubin, UA: NEGATIVE
Glucose, UA: NEGATIVE
Leukocytes,UA: NEGATIVE
Nitrite, UA: NEGATIVE
Specific Gravity, UA: >1.03 — ABNORMAL HIGH (ref 1.005–1.030)
Urobilinogen, Ur: 1 mg/dL (ref 0.2–1.0)
pH, UA: 5.5 (ref 5.0–7.5)

## 2021-03-16 MED ORDER — CEPHALEXIN 250 MG PO CAPS
500.0000 mg | ORAL_CAPSULE | Freq: Once | ORAL | Status: AC
  Administered 2021-03-16: 500 mg via ORAL

## 2021-03-21 ENCOUNTER — Other Ambulatory Visit: Payer: PRIVATE HEALTH INSURANCE

## 2021-03-29 DIAGNOSIS — R1084 Generalized abdominal pain: Secondary | ICD-10-CM | POA: Diagnosis not present

## 2021-03-29 DIAGNOSIS — L682 Localized hypertrichosis: Secondary | ICD-10-CM | POA: Diagnosis not present

## 2021-04-13 ENCOUNTER — Ambulatory Visit
Admission: RE | Admit: 2021-04-13 | Discharge: 2021-04-13 | Disposition: A | Discharge status: Home / Self Care | Payer: No Typology Code available for payment source | Source: Ambulatory Visit | Attending: Internal Medicine | Admitting: Internal Medicine

## 2021-04-13 DIAGNOSIS — E785 Hyperlipidemia, unspecified: Secondary | ICD-10-CM

## 2021-04-18 DIAGNOSIS — D751 Secondary polycythemia: Secondary | ICD-10-CM | POA: Diagnosis not present

## 2021-04-20 DIAGNOSIS — D751 Secondary polycythemia: Secondary | ICD-10-CM | POA: Diagnosis not present

## 2021-04-20 DIAGNOSIS — I2510 Atherosclerotic heart disease of native coronary artery without angina pectoris: Secondary | ICD-10-CM | POA: Diagnosis not present

## 2021-05-11 DIAGNOSIS — M150 Primary generalized (osteo)arthritis: Secondary | ICD-10-CM | POA: Diagnosis not present

## 2021-05-11 DIAGNOSIS — R5383 Other fatigue: Secondary | ICD-10-CM | POA: Diagnosis not present

## 2021-05-11 DIAGNOSIS — L409 Psoriasis, unspecified: Secondary | ICD-10-CM | POA: Diagnosis not present

## 2021-05-11 DIAGNOSIS — Z79899 Other long term (current) drug therapy: Secondary | ICD-10-CM | POA: Diagnosis not present

## 2021-05-11 DIAGNOSIS — L4059 Other psoriatic arthropathy: Secondary | ICD-10-CM | POA: Diagnosis not present

## 2021-05-11 DIAGNOSIS — G8929 Other chronic pain: Secondary | ICD-10-CM | POA: Diagnosis not present

## 2021-05-25 DIAGNOSIS — E7801 Familial hypercholesterolemia: Secondary | ICD-10-CM | POA: Diagnosis not present

## 2021-05-26 DIAGNOSIS — Z79899 Other long term (current) drug therapy: Secondary | ICD-10-CM | POA: Diagnosis not present

## 2021-05-26 DIAGNOSIS — L4059 Other psoriatic arthropathy: Secondary | ICD-10-CM | POA: Diagnosis not present

## 2021-06-13 NOTE — Progress Notes (Deleted)
? ? ?06/14/2021 ?10:03 AM  ? ?Caitlin Moran ?08-Jun-1960 ?[id number] ? ?Referring provider: Deland Pretty, MD ?[place],  [place] [id number] ? ?No chief complaint on file. ? ?Urological history: ?1. Nephrolithiasis ?-10/28/2020 Ct renal stone study showed no evidence of obstructive uropathy. Nonobstructive stones of the lower pole of the right kidney, stone burden is overall decreased when compared with prior CT dated 05/25/2016 ? ?2. Intermediate risk hematuria ?-former smoker ?-CT 2022 - nephrolithiasis ?-cysto 2023 - NED ?-no reports of gross heme ?-UA *** ? ?HPI: ?Caitlin Moran is a 61 y.o. female who presents today for three month follow up. ? ?UA ***   ? ?PMH: ?Past Medical History:  ?Diagnosis Date  ? Anxiety   ? Arthritis   ? neck  ? Depression   ? GERD (gastroesophageal reflux disease)   ? Headache   ? History of kidney stones   ? kidney stone -lithrotrispy 04/30/18- 2 current - stable  ? IBS (irritable bowel syndrome)   ? OSA (obstructive sleep apnea)   ? on CPAP  ? Polycythemia vera ([place])   ? PONV (postoperative nausea and vomiting)   ? Didn't go to sleep all the way  during colonoscopy.  ? Renal calculus, right   ? Right ureteral stone   ? Spinal stenosis   ? ? ?Surgical History: ?Past Surgical History:  ?Procedure Laterality Date  ? ABLATION    ? unquestionable  ? BIOPSY  05/01/2018  ? Procedure: BIOPSY;  Surgeon: Irving Copas., MD;  Location: [facility];  Service: Gastroenterology;;  ? BIOPSY  05/24/2020  ? Procedure: BIOPSY;  Surgeon: Irving Copas., MD;  Location: [facility];  Service: Gastroenterology;;  ? CESAREAN SECTION    ? CHOLECYSTECTOMY    ? COLONOSCOPY    ? COLONOSCOPY WITH PROPOFOL N/A 05/24/2020  ? Procedure: COLONOSCOPY WITH PROPOFOL;  Surgeon: Mansouraty, Telford Nab., MD;  Location: [facility];  Service: Gastroenterology;  Laterality: N/A;  ? ESOPHAGOGASTRODUODENOSCOPY    ? ESOPHAGOGASTRODUODENOSCOPY N/A 05/01/2018  ? Procedure:  ESOPHAGOGASTRODUODENOSCOPY (EGD);  Surgeon: Irving Copas., MD;  Location: [facility];  Service: Gastroenterology;  Laterality: N/A;  ? ESOPHAGOGASTRODUODENOSCOPY (EGD) WITH PROPOFOL N/A 05/24/2020  ? Procedure: ESOPHAGOGASTRODUODENOSCOPY (EGD) WITH PROPOFOL;  Surgeon: Rush Landmark Telford Nab., MD;  Location: [facility];  Service: Gastroenterology;  Laterality: N/A;  ? EUS N/A 05/01/2018  ? Procedure: UPPER ENDOSCOPIC ULTRASOUND (EUS) RADIAL;  Surgeon: Rush Landmark Telford Nab., MD;  Location: [facility];  Service: Gastroenterology;  Laterality: N/A;  ? EXTRACORPOREAL SHOCK WAVE LITHOTRIPSY  02/14/2010  ? HYSTEROSCOPY WITH D & C N/A 11/27/2012  ? Procedure: DILATATION AND CURETTAGE /HYSTEROSCOPY;  Surgeon: Marylynn Pearson, MD;  Location: [facility];  Service: Gynecology;  Laterality: N/A;  ? SUBMUCOSAL LIFTING INJECTION  05/24/2020  ? Procedure: SUBMUCOSAL LIFTING INJECTION;  Surgeon: Irving Copas., MD;  Location: [facility];  Service: Gastroenterology;;  ? WISDOM TOOTH EXTRACTION    ? ? ?Home Medications:  ?Allergies as of 06/14/2021   ?No Known Allergies ?  ? ?  ?Medication List  ?  ? ?  ? Accurate as of June 13, 2021 10:03 AM. If you have any questions, ask your nurse or doctor.  ?  ?  ? ?  ? ?aspirin EC 81 MG tablet ?Take 81 mg by mouth daily. ?  ?Calcium-Vitamin D 600-400 MG-UNIT Tabs ?Take 1 tablet by mouth daily. ?  ?celecoxib 200 MG capsule ?Commonly known as: CELEBREX ?Take 200 mg by mouth daily. ?  ?clobetasol  cream 0.05 % ?Commonly known as: TEMOVATE ?Apply 1 application topically daily as needed (for psoriasis). ?  ?fish oil-omega-3 fatty acids 1000 MG capsule ?Take 1 g by mouth daily. ?  ?Golimumab 50 MG/0.5ML Soaj ?Inject 50 mg into the skin every 30 (thirty) days. ?  ?HYDROcodone-acetaminophen 7.5-325 MG tablet ?Commonly known as: NORCO ?Take 1 tablet by mouth every 6 (six) hours as needed for moderate pain. ?  ?omeprazole 40 MG capsule ?Commonly known as: PRILOSEC ?Take 1 capsule (40 mg  total) by mouth 2 (two) times daily before a meal. ?  ?PROBIOTIC PO ?Take 1 capsule by mouth daily. ?  ?rosuvastatin 5 MG tablet ?Commonly known as: CRESTOR ?Take 5 mg by mouth daily. ?  ?tiZANidine 2 MG tablet ?Commonly known as: ZANAFLEX ?Take 2 mg by mouth 3 (three) times daily as needed. ?  ?Turmeric Curcumin 500 MG Caps ?See admin instructions. ?  ?Vitamin D 50 MCG (2000 UT) Caps ?Take 2,000 Units by mouth daily. ?  ?vortioxetine HBr 20 MG Tabs tablet ?Commonly known as: TRINTELLIX ?Take 20 mg by mouth daily. ?  ? ?  ? ? ?Allergies: No Known Allergies ? ?Family History: ?Family History  ?Problem Relation Age of Onset  ? Breast cancer Maternal Aunt   ? Esophageal cancer Mother   ? Lung cancer Father   ? CAD Father   ? Colon cancer Neg Hx   ? Inflammatory bowel disease Neg Hx   ? Liver disease Neg Hx   ? Pancreatic cancer Neg Hx   ? Stomach cancer Neg Hx   ? Rectal cancer Neg Hx   ? ? ?Social History:  reports that she quit smoking about 30 years ago. Her smoking use included cigarettes. She has never used smokeless tobacco. She reports current alcohol use of about 7.0 standard drinks per week. She reports that she does not use drugs. ? ?ROS: ?Pertinent ROS in HPI ? ?Physical Exam: ?LMP 09/24/2012   ?Constitutional:  Well nourished. Alert and oriented, No acute distress. ?HEENT: [place] AT, moist mucus membranes.  Trachea midline, no masses. ?Cardiovascular: No clubbing, cyanosis, or edema. ?Respiratory: Normal respiratory effort, no increased work of breathing. ?GI: Abdomen is soft, non tender, non distended, no abdominal masses. Liver and spleen not palpable.  No hernias appreciated.  Stool sample for occult testing is not indicated.   ?GU: No CVA tenderness.  No bladder fullness or masses.  *** external genitalia, *** pubic hair distribution, no lesions.  Normal urethral meatus, no lesions, no prolapse, no discharge.   No urethral masses, tenderness and/or tenderness. No bladder fullness, tenderness or masses. ***  vagina mucosa, *** estrogen effect, no discharge, no lesions, *** pelvic support, *** cystocele and *** rectocele noted.  No cervical motion tenderness.  Uterus is freely mobile and non-fixed.  No adnexal/parametria masses or tenderness noted.  Anus and perineum are without rashes or lesions.   ***  ?Skin: No rashes, bruises or suspicious lesions. ?Lymph: No cervical or inguinal adenopathy. ?Neurologic: Grossly intact, no focal deficits, moving all 4 extremities. ?Psychiatric: Normal mood and affect.   ? ?Laboratory Data: ?Urinalysis ?*** ? ? ?I have reviewed the labs. ? ? ?Pertinent Imaging: ?N/A ? ?Assessment & Plan:  *** ? ?1. Intermediate risk hematuria ?-recent work up negative ?-no reports of gross heme ?-UA *** ? ?No follow-ups on file. ? ?These notes generated with voice recognition software. I apologize for typographical errors. ? ?Martasia Talamante, PA-C ? ?[place] ?[address]  [place], [place] [address] ?[phone] ?  ?

## 2021-06-14 ENCOUNTER — Ambulatory Visit: Payer: PRIVATE HEALTH INSURANCE | Admitting: Urology

## 2021-06-28 ENCOUNTER — Ambulatory Visit: Payer: PRIVATE HEALTH INSURANCE | Admitting: Family Medicine

## 2021-07-04 DIAGNOSIS — Z13820 Encounter for screening for osteoporosis: Secondary | ICD-10-CM | POA: Diagnosis not present

## 2021-07-08 DIAGNOSIS — Z6841 Body Mass Index (BMI) 40.0 and over, adult: Secondary | ICD-10-CM | POA: Diagnosis not present

## 2021-07-08 DIAGNOSIS — Z1231 Encounter for screening mammogram for malignant neoplasm of breast: Secondary | ICD-10-CM | POA: Diagnosis not present

## 2021-07-08 DIAGNOSIS — Z01419 Encounter for gynecological examination (general) (routine) without abnormal findings: Secondary | ICD-10-CM | POA: Diagnosis not present

## 2021-07-25 ENCOUNTER — Ambulatory Visit: Payer: BC Managed Care – PPO | Admitting: Family Medicine

## 2021-07-25 ENCOUNTER — Encounter: Payer: Self-pay | Admitting: Family Medicine

## 2021-07-25 VITALS — BP 153/81 | HR 55 | Ht 67.0 in | Wt 253.5 lb

## 2021-07-25 DIAGNOSIS — Z9989 Dependence on other enabling machines and devices: Secondary | ICD-10-CM | POA: Diagnosis not present

## 2021-07-25 DIAGNOSIS — G4733 Obstructive sleep apnea (adult) (pediatric): Secondary | ICD-10-CM | POA: Diagnosis not present

## 2021-07-25 NOTE — Progress Notes (Signed)
CM sent to [facility] for new order ?

## 2021-07-25 NOTE — Patient Instructions (Addendum)
Please continue using your CPAP regularly. While your insurance requires that you use CPAP at least 4 hours each night on 70% of the nights, I recommend, that you not skip any nights and use it throughout the night if you can. Getting used to CPAP and staying with the treatment long term does take time and patience and discipline. Untreated obstructive sleep apnea when it is moderate to severe can have an adverse impact on cardiovascular health and raise her risk for heart disease, arrhythmias, hypertension, congestive heart failure, stroke and diabetes. Untreated obstructive sleep apnea causes sleep disruption, nonrestorative sleep, and sleep deprivation. This can have an impact on your day to day functioning and cause daytime sleepiness and impairment of cognitive function, memory loss, mood disturbance, and problems focussing. Using CPAP regularly can improve these symptoms.  Follow up in 6 months.  

## 2021-07-25 NOTE — Progress Notes (Signed)
? ? ?PATIENT: Caitlin Moran ?DOB: [birth year] ? ?REASON FOR VISIT: follow up ?HISTORY FROM: patient ? ?Chief Complaint  ?Patient presents with  ? Obstructive Sleep Apnea  ?  Rm 1, alone. Here for 6 month CPAP f/u. Pt reports going back to her old mask, seems to do better. Pt had a stomach virus and was off for a while.   ? ?  ?HISTORY OF PRESENT ILLNESS: ? ?07/25/21 ALL: ?Caitlin Moran returns for follow up for OSA on CPAP. She was last seen 12/2020 and continued to adjust to therapy. Compliance had improved, however, she was still having difficulty tolerating nasal mask. We sent orders for a mask refitting. Since, she reports doing a little better. She has resumed old mask. She is using a FFM and feels that she has finally adjusted. She does feel that she feels better rested when using CPAP. She has never been a morning person and continues to have difficulty getting moving in the mornings. Insurance is not paying for supplies. She reports having extra supplies and is planning to call her payer to question benefits.  ? ? ? ?12/27/2020 ALL:  ?Caitlin Moran returns for CPAP follow up. She continues to do well on therapy. She had noticed more leaking with nasal mask and has returned to using FFM. She is working on improving four hour compliance. She is not certain she feels any better on CPAP therapy but recognizes health benefits of management.  ? ? ? ?06/29/20 ALL:  ?Caitlin Moran is a 61 y.o. female here today for follow up for OSA on CPAP. She continues to struggle with 4 hour compliance. She denies concerns with machine or supplies. New mask did not seem to help with 4 hour compliance. Leak has improved. She takes her mask off during the night at some point and does not replace. She is uncertain why she is removing the mask. She does report hearing noises from the machine that seem to wake her.  ?  ?  ? ?12/29/2019 ALL: ?Caitlin Moran is a 61 y.o. female here today for follow up for OSA on CPAP.  She admits that she  continues to struggle with 4-hour compliance.  She reports that she will wake up in the middle of the night and is unable to get back to sleep because she can hear the machine running.  She is currently using a full facemask and has noted a leak.  She is interested in trying a new mask.  She is motivated to continue using CPAP therapy.  She is uncertain if she has noted significant benefit but knows risk of untreated sleep apnea. ? ?Compliance report dated 11/17/2019 through 12/16/2019 reveals that she used CPAP 23 of the past 30 days for compliance of 77%.  She is CPAP greater than 4 hours 8 of the past 30 days for compliance of 27%.  Average usage on days used was 3 hours and 27 minutes.  Residual AHI was 2.0 on 6 to 16 cm of water and an EPR of 3.  There was a significant leak noted in the 95th percentile of 37.1 L/min. ? ?HISTORY: (copied from my note on 06/26/2019) ? ?Caitlin Moran is a 61 y.o. female here today for follow up for OSA on CPAP therapy.  She reports that she is doing very well.  She has no difficulty with her machine or supplies.  She does admit that compliance remains her biggest concern.  She just cannot seem to get in the habit  of using her machine every night.  When she does use it she typically takes her mask off in the middle the night.  She feels that this is simply related to being inconsistent with these. ?  ?Compliance report dated 03/27/2019 through 06/24/2019 reveals that she used CPAP therapy 36 of the past 90 nights for compliance of 40%.  She used CPAP greater than 4 hours 9 of the past 90 nights for compliance of 10%.  Average usage on days used was 3 hours and 21 minutes.  Residual AHI was 3.7 on 6 to 16 cm of water and an EPR of 3.  There was no significant leak noted. ?  ?HISTORY: (copied from my note on 01/29/2019) ?  ?Caitlin Moran is a 61 y.o. female here today for follow up. She was recently diagnosed with severe OSA and started on CPAP. She was found to have hypoxemia  that may be cause of rising H&H. She is trying to adapt to CPAP therapy. She is usually able to go to sleep without difficulty but then wakes to a loud humming sound. She will take her mask off for a couple of seconds and replace. Humming sound goes away. Otherwise she is doing well.  ?  ?Compliance report dated 12/29/2018 through 01/27/2019 reveals that she used CPAP 22 of the last 30 days for compliance of 73%.  She used CPAP greater than 4 hours of 8 of the last 30 days for compliance of 27%.  Average usage was 3 hours and 12 minutes.  AHI was 2.8 on 6 to 16 cm of water and an EPR of 3.  There was no significant leak noted. ? ? ?REVIEW OF SYSTEMS: Out of a complete 14 system review of symptoms, the patient complains only of the following symptoms, right hip and thoracic pain  and all other reviewed systems are negative. ? ?ESS: 8/24, previously 5/24 ? ?ALLERGIES: ?No Known Allergies ? ?HOME MEDICATIONS: ?Outpatient Medications Prior to Visit  ?Medication Sig Dispense Refill  ? aspirin EC 81 MG tablet Take 81 mg by mouth daily.    ? Calcium Carb-Cholecalciferol (CALCIUM-VITAMIN D) 600-400 MG-UNIT TABS Take 1 tablet by mouth daily.    ? celecoxib (CELEBREX) 200 MG capsule Take 200 mg by mouth daily.     ? Cholecalciferol (VITAMIN D) 50 MCG (2000 UT) CAPS Take 2,000 Units by mouth daily.     ? clobetasol cream (TEMOVATE) 4.26 % Apply 1 application topically daily as needed (for psoriasis).     ? fish oil-omega-3 fatty acids 1000 MG capsule Take 1 g by mouth daily.     ? Golimumab 50 MG/0.5ML SOAJ Inject 50 mg into the skin every 30 (thirty) days.     ? HYDROcodone-acetaminophen (NORCO) 7.5-325 MG tablet Take 1 tablet by mouth every 6 (six) hours as needed for moderate pain.  0  ? omeprazole (PRILOSEC) 40 MG capsule Take 1 capsule (40 mg total) by mouth 2 (two) times daily before a meal. 60 capsule 0  ? Probiotic Product (PROBIOTIC PO) Take 1 capsule by mouth daily.    ? rosuvastatin (CRESTOR) 5 MG tablet Take 5 mg  by mouth daily.    ? tiZANidine (ZANAFLEX) 2 MG tablet Take 2 mg by mouth 3 (three) times daily as needed.    ? Turmeric Curcumin 500 MG CAPS See admin instructions.    ? vortioxetine HBr (TRINTELLIX) 20 MG TABS tablet Take 20 mg by mouth daily.    ? ?No facility-administered medications prior  to visit.  ? ? ?PAST MEDICAL HISTORY: ?Past Medical History:  ?Diagnosis Date  ? Anxiety   ? Arthritis   ? neck  ? Depression   ? GERD (gastroesophageal reflux disease)   ? Headache   ? History of kidney stones   ? kidney stone -lithrotrispy 04/30/18- 2 current - stable  ? IBS (irritable bowel syndrome)   ? OSA (obstructive sleep apnea)   ? on CPAP  ? Polycythemia vera ([place])   ? PONV (postoperative nausea and vomiting)   ? Didn't go to sleep all the way  during colonoscopy.  ? Renal calculus, right   ? Right ureteral stone   ? Spinal stenosis   ? ? ?PAST SURGICAL HISTORY: ?Past Surgical History:  ?Procedure Laterality Date  ? ABLATION    ? unquestionable  ? BIOPSY  05/01/2018  ? Procedure: BIOPSY;  Surgeon: Irving Copas., MD;  Location: [facility];  Service: Gastroenterology;;  ? BIOPSY  05/24/2020  ? Procedure: BIOPSY;  Surgeon: Irving Copas., MD;  Location: [facility];  Service: Gastroenterology;;  ? CESAREAN SECTION    ? CHOLECYSTECTOMY    ? COLONOSCOPY    ? COLONOSCOPY WITH PROPOFOL N/A 05/24/2020  ? Procedure: COLONOSCOPY WITH PROPOFOL;  Surgeon: Mansouraty, Telford Nab., MD;  Location: [facility];  Service: Gastroenterology;  Laterality: N/A;  ? ESOPHAGOGASTRODUODENOSCOPY    ? ESOPHAGOGASTRODUODENOSCOPY N/A 05/01/2018  ? Procedure: ESOPHAGOGASTRODUODENOSCOPY (EGD);  Surgeon: Irving Copas., MD;  Location: [facility];  Service: Gastroenterology;  Laterality: N/A;  ? ESOPHAGOGASTRODUODENOSCOPY (EGD) WITH PROPOFOL N/A 05/24/2020  ? Procedure: ESOPHAGOGASTRODUODENOSCOPY (EGD) WITH PROPOFOL;  Surgeon: Rush Landmark Telford Nab., MD;  Location: [facility];  Service: Gastroenterology;  Laterality:  N/A;  ? EUS N/A 05/01/2018  ? Procedure: UPPER ENDOSCOPIC ULTRASOUND (EUS) RADIAL;  Surgeon: Rush Landmark Telford Nab., MD;  Location: [facility];  Service: Gastroenterology;  Laterality: N/A;  ? EXTRACORPO

## 2021-08-06 DIAGNOSIS — M79662 Pain in left lower leg: Secondary | ICD-10-CM | POA: Diagnosis not present

## 2021-08-06 DIAGNOSIS — L03115 Cellulitis of right lower limb: Secondary | ICD-10-CM | POA: Diagnosis not present

## 2021-08-06 DIAGNOSIS — M7989 Other specified soft tissue disorders: Secondary | ICD-10-CM | POA: Diagnosis not present

## 2021-08-06 DIAGNOSIS — R918 Other nonspecific abnormal finding of lung field: Secondary | ICD-10-CM | POA: Diagnosis not present

## 2021-08-06 DIAGNOSIS — J9811 Atelectasis: Secondary | ICD-10-CM | POA: Diagnosis not present

## 2021-08-06 DIAGNOSIS — Z9049 Acquired absence of other specified parts of digestive tract: Secondary | ICD-10-CM | POA: Diagnosis not present

## 2021-08-06 DIAGNOSIS — R600 Localized edema: Secondary | ICD-10-CM | POA: Diagnosis not present

## 2021-08-06 DIAGNOSIS — M79661 Pain in right lower leg: Secondary | ICD-10-CM | POA: Diagnosis not present

## 2021-08-06 DIAGNOSIS — E785 Hyperlipidemia, unspecified: Secondary | ICD-10-CM | POA: Diagnosis not present

## 2021-08-06 DIAGNOSIS — R2241 Localized swelling, mass and lump, right lower limb: Secondary | ICD-10-CM | POA: Diagnosis not present

## 2021-08-07 DIAGNOSIS — R918 Other nonspecific abnormal finding of lung field: Secondary | ICD-10-CM | POA: Diagnosis not present

## 2021-08-07 DIAGNOSIS — J9811 Atelectasis: Secondary | ICD-10-CM | POA: Diagnosis not present

## 2021-08-07 DIAGNOSIS — M79662 Pain in left lower leg: Secondary | ICD-10-CM | POA: Diagnosis not present

## 2021-08-07 DIAGNOSIS — R600 Localized edema: Secondary | ICD-10-CM | POA: Diagnosis not present

## 2021-08-07 DIAGNOSIS — M79661 Pain in right lower leg: Secondary | ICD-10-CM | POA: Diagnosis not present

## 2021-08-07 DIAGNOSIS — M7989 Other specified soft tissue disorders: Secondary | ICD-10-CM | POA: Diagnosis not present

## 2021-08-11 DIAGNOSIS — L4059 Other psoriatic arthropathy: Secondary | ICD-10-CM | POA: Diagnosis not present

## 2021-08-11 DIAGNOSIS — R5382 Chronic fatigue, unspecified: Secondary | ICD-10-CM | POA: Diagnosis not present

## 2021-08-11 DIAGNOSIS — G8929 Other chronic pain: Secondary | ICD-10-CM | POA: Diagnosis not present

## 2021-08-11 DIAGNOSIS — M25551 Pain in right hip: Secondary | ICD-10-CM | POA: Diagnosis not present

## 2021-08-11 DIAGNOSIS — Z79899 Other long term (current) drug therapy: Secondary | ICD-10-CM | POA: Diagnosis not present

## 2021-08-11 DIAGNOSIS — L409 Psoriasis, unspecified: Secondary | ICD-10-CM | POA: Diagnosis not present

## 2021-08-13 DIAGNOSIS — G4733 Obstructive sleep apnea (adult) (pediatric): Secondary | ICD-10-CM | POA: Diagnosis not present

## 2021-08-15 ENCOUNTER — Telehealth: Payer: Self-pay | Admitting: Urology

## 2021-08-15 NOTE — Telephone Encounter (Signed)
Please have Caitlin Moran reschedule her missed appointment from April, so that we may recheck her urine to ensure her microscopic hematuria does not persist.  If she continues to have microscopic hematuria or gross hematuria, we need to pursue a CT urogram to rule out kidney or bladder malignancies.

## 2021-08-16 NOTE — Telephone Encounter (Signed)
LMOM for patient to call and schedule a follow up for a UA check.

## 2021-08-30 DIAGNOSIS — M545 Low back pain, unspecified: Secondary | ICD-10-CM | POA: Diagnosis not present

## 2021-08-30 DIAGNOSIS — M25551 Pain in right hip: Secondary | ICD-10-CM | POA: Diagnosis not present

## 2021-09-05 ENCOUNTER — Telehealth: Payer: Self-pay | Admitting: Podiatry

## 2021-09-05 NOTE — Telephone Encounter (Signed)
I have a question about a previous bill that's been sort of disputed with the insurance company. I'm trying to get that straightened out. Please call me back. Thank you.

## 2021-09-17 DIAGNOSIS — M5416 Radiculopathy, lumbar region: Secondary | ICD-10-CM | POA: Diagnosis not present

## 2021-09-21 DIAGNOSIS — M25551 Pain in right hip: Secondary | ICD-10-CM | POA: Diagnosis not present

## 2021-09-21 DIAGNOSIS — M5451 Vertebrogenic low back pain: Secondary | ICD-10-CM | POA: Diagnosis not present

## 2021-09-29 DIAGNOSIS — M5451 Vertebrogenic low back pain: Secondary | ICD-10-CM | POA: Diagnosis not present

## 2021-09-29 DIAGNOSIS — M25551 Pain in right hip: Secondary | ICD-10-CM | POA: Diagnosis not present

## 2021-10-06 DIAGNOSIS — M5126 Other intervertebral disc displacement, lumbar region: Secondary | ICD-10-CM | POA: Diagnosis not present

## 2021-10-17 DIAGNOSIS — M5416 Radiculopathy, lumbar region: Secondary | ICD-10-CM | POA: Diagnosis not present

## 2021-10-24 DIAGNOSIS — M5451 Vertebrogenic low back pain: Secondary | ICD-10-CM | POA: Diagnosis not present

## 2021-10-24 DIAGNOSIS — M25551 Pain in right hip: Secondary | ICD-10-CM | POA: Diagnosis not present

## 2021-11-01 DIAGNOSIS — M545 Low back pain, unspecified: Secondary | ICD-10-CM | POA: Diagnosis not present

## 2021-11-07 DIAGNOSIS — M25551 Pain in right hip: Secondary | ICD-10-CM | POA: Diagnosis not present

## 2021-11-07 DIAGNOSIS — M5451 Vertebrogenic low back pain: Secondary | ICD-10-CM | POA: Diagnosis not present

## 2021-11-17 DIAGNOSIS — G8929 Other chronic pain: Secondary | ICD-10-CM | POA: Diagnosis not present

## 2021-11-17 DIAGNOSIS — L4059 Other psoriatic arthropathy: Secondary | ICD-10-CM | POA: Diagnosis not present

## 2021-11-17 DIAGNOSIS — Z79899 Other long term (current) drug therapy: Secondary | ICD-10-CM | POA: Diagnosis not present

## 2021-11-17 DIAGNOSIS — L409 Psoriasis, unspecified: Secondary | ICD-10-CM | POA: Diagnosis not present

## 2021-11-23 DIAGNOSIS — G4733 Obstructive sleep apnea (adult) (pediatric): Secondary | ICD-10-CM | POA: Diagnosis not present

## 2021-11-28 DIAGNOSIS — M5416 Radiculopathy, lumbar region: Secondary | ICD-10-CM | POA: Diagnosis not present

## 2021-11-30 DIAGNOSIS — M5451 Vertebrogenic low back pain: Secondary | ICD-10-CM | POA: Diagnosis not present

## 2021-11-30 DIAGNOSIS — M25551 Pain in right hip: Secondary | ICD-10-CM | POA: Diagnosis not present

## 2021-12-06 DIAGNOSIS — R931 Abnormal findings on diagnostic imaging of heart and coronary circulation: Secondary | ICD-10-CM | POA: Diagnosis not present

## 2021-12-06 DIAGNOSIS — E7800 Pure hypercholesterolemia, unspecified: Secondary | ICD-10-CM | POA: Diagnosis not present

## 2021-12-07 DIAGNOSIS — H2513 Age-related nuclear cataract, bilateral: Secondary | ICD-10-CM | POA: Diagnosis not present

## 2021-12-07 DIAGNOSIS — H524 Presbyopia: Secondary | ICD-10-CM | POA: Diagnosis not present

## 2021-12-15 DIAGNOSIS — M25551 Pain in right hip: Secondary | ICD-10-CM | POA: Diagnosis not present

## 2021-12-15 DIAGNOSIS — M5451 Vertebrogenic low back pain: Secondary | ICD-10-CM | POA: Diagnosis not present

## 2021-12-23 DIAGNOSIS — G4733 Obstructive sleep apnea (adult) (pediatric): Secondary | ICD-10-CM | POA: Diagnosis not present

## 2021-12-28 DIAGNOSIS — L821 Other seborrheic keratosis: Secondary | ICD-10-CM | POA: Diagnosis not present

## 2021-12-28 DIAGNOSIS — D1809 Hemangioma of other sites: Secondary | ICD-10-CM | POA: Diagnosis not present

## 2021-12-28 DIAGNOSIS — D1801 Hemangioma of skin and subcutaneous tissue: Secondary | ICD-10-CM | POA: Diagnosis not present

## 2021-12-28 DIAGNOSIS — L814 Other melanin hyperpigmentation: Secondary | ICD-10-CM | POA: Diagnosis not present

## 2022-01-02 DIAGNOSIS — M25551 Pain in right hip: Secondary | ICD-10-CM | POA: Diagnosis not present

## 2022-01-02 DIAGNOSIS — M5451 Vertebrogenic low back pain: Secondary | ICD-10-CM | POA: Diagnosis not present

## 2022-01-09 DIAGNOSIS — G4733 Obstructive sleep apnea (adult) (pediatric): Secondary | ICD-10-CM | POA: Diagnosis not present

## 2022-01-12 DIAGNOSIS — M545 Low back pain, unspecified: Secondary | ICD-10-CM | POA: Diagnosis not present

## 2022-01-23 NOTE — Patient Instructions (Signed)
Please continue using your CPAP regularly. While your insurance requires that you use CPAP at least 4 hours each night on 70% of the nights, I recommend, that you not skip any nights and use it throughout the night if you can. Getting used to CPAP and staying with the treatment long term does take time and patience and discipline. Untreated obstructive sleep apnea when it is moderate to severe can have an adverse impact on cardiovascular health and raise her risk for heart disease, arrhythmias, hypertension, congestive heart failure, stroke and diabetes. Untreated obstructive sleep apnea causes sleep disruption, nonrestorative sleep, and sleep deprivation. This can have an impact on your day to day functioning and cause daytime sleepiness and impairment of cognitive function, memory loss, mood disturbance, and problems focussing. Using CPAP regularly can improve these symptoms.  I will adjust pressure settings to see if this helps with your apneic events. Let me know if you have any trouble tolerating pressures.   Follow up pending review of compliance data in 4-6 weeks.

## 2022-01-23 NOTE — Progress Notes (Unsigned)
   PATIENT: Caitlin Moran DOB: 27-Jan-1961  REASON FOR VISIT: follow up HISTORY FROM: patient  Virtual Visit via Telephone Note  I connected with Caitlin Moran on 01/24/22 at  3:00 PM EST by telephone and verified that I am speaking with the correct person using two identifiers.   I discussed the limitations, risks, security and privacy concerns of performing an evaluation and management service by telephone and the availability of in person appointments. I also discussed with the patient that there may be a patient responsible charge related to this service. The patient expressed understanding and agreed to proceed.   History of Present Illness:  01/24/22 ALL: Caitlin Moran is a 61 y.o. female here today for follow up for OSA on CPAP. She was last seen 07/2021. Daily compliance was acceptable but 4 hour compliance remained low. She has improved compliance and now using CPAP most every night for 5-6 hours. Previous reports show AHI has always been well managed at less than 4 events per hour, however, most recent download shows AHI 22/h. Pressure in the 95th percentile of 15.9 with autoPAP setting of 6-16cmH20. Previous pressures have been around 12. NO significant changes in medical history or weight. She is doing well and tolerating therapy. She reports feeling a little better overall.     Observations/Objective:  Generalized: Well developed, in no acute distress  Mentation: Alert oriented to time, place, history taking. Follows all commands speech and language fluent   Assessment and Plan:  61 y.o. year old female  has a past medical history of Anxiety, Arthritis, Depression, GERD (gastroesophageal reflux disease), Headache, History of kidney stones, IBS (irritable bowel syndrome), OSA (obstructive sleep apnea), Polycythemia vera ([place]), PONV (postoperative nausea and vomiting), Renal calculus, right, Right ureteral stone, and Spinal stenosis. here with    ICD-10-CM   1.  OSA on CPAP  G47.33       Caitlin Moran is doing well. She has improved compliance and now 93% compliant with daily use and 83% with 4 hour. Unfortunately, her AHI has significantly increased and now 23/h. I will adjust pressure setting to 5-20cmH20. I will reprint download in 4-6 weeks to assess. May consider titration study if needed. She was encouraged to continue using CPAP nightly for at least 4 hours. Healthy lifestyle habits encouraged. She will follow up pending next download.   No orders of the defined types were placed in this encounter.   No orders of the defined types were placed in this encounter.    Follow Up Instructions:  I discussed the assessment and treatment plan with the patient. The patient was provided an opportunity to ask questions and all were answered. The patient agreed with the plan and demonstrated an understanding of the instructions.   The patient was advised to call back or seek an in-person evaluation if the symptoms worsen or if the condition fails to improve as anticipated.  I provided 15 minutes of non-face-to-face time during this encounter. Patient located at their place of residence during [place] visit. Provider is in the office.    Debbora Presto, NP

## 2022-01-24 ENCOUNTER — Encounter: Payer: Self-pay | Admitting: Family Medicine

## 2022-01-24 ENCOUNTER — Telehealth (INDEPENDENT_AMBULATORY_CARE_PROVIDER_SITE_OTHER): Payer: BC Managed Care – PPO | Admitting: Family Medicine

## 2022-01-24 DIAGNOSIS — G4733 Obstructive sleep apnea (adult) (pediatric): Secondary | ICD-10-CM | POA: Diagnosis not present

## 2022-01-25 ENCOUNTER — Telehealth: Payer: Self-pay

## 2022-01-27 DIAGNOSIS — M79671 Pain in right foot: Secondary | ICD-10-CM | POA: Diagnosis not present

## 2022-01-27 DIAGNOSIS — M7752 Other enthesopathy of left foot: Secondary | ICD-10-CM | POA: Diagnosis not present

## 2022-01-27 DIAGNOSIS — M7751 Other enthesopathy of right foot: Secondary | ICD-10-CM | POA: Diagnosis not present

## 2022-01-27 DIAGNOSIS — M21612 Bunion of left foot: Secondary | ICD-10-CM | POA: Diagnosis not present

## 2022-01-27 DIAGNOSIS — M21611 Bunion of right foot: Secondary | ICD-10-CM | POA: Diagnosis not present

## 2022-01-27 DIAGNOSIS — M79672 Pain in left foot: Secondary | ICD-10-CM | POA: Diagnosis not present

## 2022-02-06 ENCOUNTER — Ambulatory Visit: Payer: PRIVATE HEALTH INSURANCE | Admitting: Podiatry

## 2022-02-09 DIAGNOSIS — G4733 Obstructive sleep apnea (adult) (pediatric): Secondary | ICD-10-CM | POA: Diagnosis not present

## 2022-02-16 ENCOUNTER — Encounter: Payer: Self-pay | Admitting: Urology

## 2022-02-20 DIAGNOSIS — F339 Major depressive disorder, recurrent, unspecified: Secondary | ICD-10-CM | POA: Diagnosis not present

## 2022-02-20 DIAGNOSIS — L4050 Arthropathic psoriasis, unspecified: Secondary | ICD-10-CM | POA: Diagnosis not present

## 2022-02-20 DIAGNOSIS — Z0001 Encounter for general adult medical examination with abnormal findings: Secondary | ICD-10-CM | POA: Diagnosis not present

## 2022-02-20 DIAGNOSIS — E785 Hyperlipidemia, unspecified: Secondary | ICD-10-CM | POA: Diagnosis not present

## 2022-02-20 DIAGNOSIS — J069 Acute upper respiratory infection, unspecified: Secondary | ICD-10-CM | POA: Diagnosis not present

## 2022-02-21 DIAGNOSIS — Z0001 Encounter for general adult medical examination with abnormal findings: Secondary | ICD-10-CM | POA: Diagnosis not present

## 2022-03-02 DIAGNOSIS — Z23 Encounter for immunization: Secondary | ICD-10-CM | POA: Diagnosis not present

## 2022-03-14 DIAGNOSIS — M545 Low back pain, unspecified: Secondary | ICD-10-CM | POA: Diagnosis not present

## 2022-03-20 NOTE — Progress Notes (Deleted)
03/21/2022 12:15 PM   Caitlin Moran 06/29/1960 [id number]  Referring provider: Deland Pretty, MD [address] [place],  [place] [address]  Urological history: 1. High risk hematuria -former smoker -MRI (2022) - no GU pathology -CT (2022) -nonobstructive stones of the lower pole of the right kidney -cysto (03/2021) - NED -no reports of gross heme -UA ***   2. Nephrolithiasis -CT (2022) - Cluster of nonobstructing stones seen in the lower pole of the right kidney, stone burden  -KUB ***     No chief complaint on file.   HPI: Caitlin Moran is a 62 y.o. female who presents today for possible stone.  UA ***  KUB ***   PMH: Past Medical History:  Diagnosis Date   Anxiety    Arthritis    neck   Depression    GERD (gastroesophageal reflux disease)    Headache    History of kidney stones    kidney stone -lithrotrispy 04/30/18- 2 current - stable   IBS (irritable bowel syndrome)    OSA (obstructive sleep apnea)    on CPAP   Polycythemia vera (HCC)    PONV (postoperative nausea and vomiting)    Didn't go to sleep all the way  during colonoscopy.   Renal calculus, right    Right ureteral stone    Spinal stenosis     Surgical History: Past Surgical History:  Procedure Laterality Date   ABLATION     unquestionable   BIOPSY  05/01/2018   Procedure: BIOPSY;  Surgeon: Rush Landmark Telford Nab., MD;  Location: [facility];  Service: Gastroenterology;;   BIOPSY  05/24/2020   Procedure: BIOPSY;  Surgeon: Irving Copas., MD;  Location: [facility];  Service: Gastroenterology;;   CESAREAN SECTION     CHOLECYSTECTOMY     COLONOSCOPY     COLONOSCOPY WITH PROPOFOL N/A 05/24/2020   Procedure: COLONOSCOPY WITH PROPOFOL;  Surgeon: Irving Copas., MD;  Location: [facility];  Service: Gastroenterology;  Laterality: N/A;   ESOPHAGOGASTRODUODENOSCOPY     ESOPHAGOGASTRODUODENOSCOPY N/A 05/01/2018   Procedure:  ESOPHAGOGASTRODUODENOSCOPY (EGD);  Surgeon: Irving Copas., MD;  Location: [facility];  Service: Gastroenterology;  Laterality: N/A;   ESOPHAGOGASTRODUODENOSCOPY (EGD) WITH PROPOFOL N/A 05/24/2020   Procedure: ESOPHAGOGASTRODUODENOSCOPY (EGD) WITH PROPOFOL;  Surgeon: Rush Landmark Telford Nab., MD;  Location: [facility];  Service: Gastroenterology;  Laterality: N/A;   EUS N/A 05/01/2018   Procedure: UPPER ENDOSCOPIC ULTRASOUND (EUS) RADIAL;  Surgeon: Irving Copas., MD;  Location: [facility];  Service: Gastroenterology;  Laterality: N/A;   EXTRACORPOREAL SHOCK WAVE LITHOTRIPSY  02/14/2010   HYSTEROSCOPY WITH D & C N/A 11/27/2012   Procedure: DILATATION AND CURETTAGE /HYSTEROSCOPY;  Surgeon: Marylynn Pearson, MD;  Location: [facility];  Service: Gynecology;  Laterality: N/A;   SUBMUCOSAL LIFTING INJECTION  05/24/2020   Procedure: SUBMUCOSAL LIFTING INJECTION;  Surgeon: Rush Landmark Telford Nab., MD;  Location: [facility];  Service: Gastroenterology;;   WISDOM TOOTH EXTRACTION      Home Medications:  Allergies as of 03/21/2022   No Known Allergies      Medication List        Accurate as of March 20, 2022 12:15 PM. If you have any questions, ask your nurse or doctor.          aspirin EC 81 MG tablet Take 81 mg by mouth daily.   Calcium-Vitamin D 600-400 MG-UNIT Tabs Take 1 tablet by mouth daily.   celecoxib 200 MG capsule Commonly known as: CELEBREX Take 200  mg by mouth daily.   clobetasol cream 0.05 % Commonly known as: TEMOVATE Apply 1 application topically daily as needed (for psoriasis).   fish oil-omega-3 fatty acids 1000 MG capsule Take 1 g by mouth daily.   Golimumab 50 MG/0.5ML Soaj Inject 50 mg into the skin every 30 (thirty) days.   HYDROcodone-acetaminophen 7.5-325 MG tablet Commonly known as: NORCO Take 1 tablet by mouth every 6 (six) hours as needed for moderate pain.   omeprazole 40 MG capsule Commonly known as: PRILOSEC Take 1 capsule (40  mg total) by mouth 2 (two) times daily before a meal.   PROBIOTIC PO Take 1 capsule by mouth daily.   rosuvastatin 5 MG tablet Commonly known as: CRESTOR Take 5 mg by mouth daily.   tiZANidine 2 MG tablet Commonly known as: ZANAFLEX Take 2 mg by mouth 3 (three) times daily as needed.   Turmeric Curcumin 500 MG Caps See admin instructions.   Vitamin D 50 MCG (2000 UT) Caps Take 2,000 Units by mouth daily.   vortioxetine HBr 20 MG Tabs tablet Commonly known as: TRINTELLIX Take 20 mg by mouth daily.        Allergies: No Known Allergies  Family History: Family History  Problem Relation Age of Onset   Breast cancer Maternal Aunt    Esophageal cancer Mother    Lung cancer Father    CAD Father    Colon cancer Neg Hx    Inflammatory bowel disease Neg Hx    Liver disease Neg Hx    Pancreatic cancer Neg Hx    Stomach cancer Neg Hx    Rectal cancer Neg Hx     Social History:  reports that she quit smoking about 31 years ago. Her smoking use included cigarettes. She has never used smokeless tobacco. She reports current alcohol use of about 7.0 standard drinks of alcohol per week. She reports that she does not use drugs.  ROS: Pertinent ROS in HPI  Physical Exam: LMP 09/24/2012   Constitutional:  Well nourished. Alert and oriented, No acute distress. HEENT: [place] AT, moist mucus membranes.  Trachea midline, no masses. Cardiovascular: No clubbing, cyanosis, or edema. Respiratory: Normal respiratory effort, no increased work of breathing. GU: No CVA tenderness.  No bladder fullness or masses. Vulvovaginal atrophy w/ pallor, loss of rugae, introital retraction, excoriations.  Vulvar thinning, fusion of labia, clitoral hood retraction, prominent urethral meatus.   *** external genitalia, *** pubic hair distribution, no lesions.  Normal urethral meatus, no lesions, no prolapse, no discharge.   No urethral masses, tenderness and/or tenderness. No bladder fullness, tenderness or  masses. *** vagina mucosa, *** estrogen effect, no discharge, no lesions, *** pelvic support, *** cystocele and *** rectocele noted.  No cervical motion tenderness.  Uterus is freely mobile and non-fixed.  No adnexal/parametria masses or tenderness noted.  Anus and perineum are without rashes or lesions.   ***  Neurologic: Grossly intact, no focal deficits, moving all 4 extremities. Psychiatric: Normal mood and affect.    Laboratory Data: Serum creatinine (07/2021) 0.96 Urinalysis N/A I have reviewed the labs.   Pertinent Imaging: ***   Assessment & Plan:  ***  1. Nephrolithiasis ***  2. High risk hematuria ***  No follow-ups on file.  These notes generated with voice recognition software. I apologize for typographical errors.  [place], [place] [address]  [place], [place] [address] [phone]

## 2022-03-21 ENCOUNTER — Other Ambulatory Visit: Payer: Self-pay | Admitting: *Deleted

## 2022-03-21 ENCOUNTER — Ambulatory Visit: Payer: PRIVATE HEALTH INSURANCE | Admitting: Urology

## 2022-03-21 DIAGNOSIS — R319 Hematuria, unspecified: Secondary | ICD-10-CM

## 2022-03-21 DIAGNOSIS — N200 Calculus of kidney: Secondary | ICD-10-CM

## 2022-03-22 NOTE — Progress Notes (Signed)
03/23/2022 11:15 PM   Caitlin Moran 25-Dec-1960 [id number]  Referring provider: Deland Pretty, MD [address] [place],  [place] [address]  Urological history: 1. High risk hematuria -former smoker -MRI (2022) - no GU pathology -CT (2022) -nonobstructive stones of the lower pole of the right kidney -cysto (03/2021) - NED -no reports of gross heme -UA . 30 RBC's   2. Nephrolithiasis -CT (2022) - Cluster of nonobstructing stones seen in the lower pole of the right kidney, stone burden  -KUB 6 mm right mid ureteral stone and two lower pole right renal stones    Chief Complaint  Patient presents with   Acute Visit   Nephrolithiasis    HPI: Caitlin Moran is a 62 y.o. female who presents today for possible stone.    PVR 0 mL   UA yellow cloudy, specific gravity greater than 1.030, 3+ blood, pH 5.5, 3+ protein, 0-5 WBCs, greater than 30 RBCs, 0-10 epithelial cells, granular and hyaline casts are present, calcium oxalate crystals are present mucus is present and moderate bacteria.  KUB 6 mm right ureteral stone and two lower pole right renal stones   She has been experiencing right flank pain that radiates to the right waist associated with nausea and vomiting off and on since December.    Patient denies any modifying or aggravating factors.  Patient denies any gross hematuria, dysuria or suprapubic/flank pain.  Patient denies any fevers and chills.   PMH: Past Medical History:  Diagnosis Date   Anxiety    Arthritis    neck   Depression    GERD (gastroesophageal reflux disease)    Headache    History of kidney stones    kidney stone -lithrotrispy 04/30/18- 2 current - stable   IBS (irritable bowel syndrome)    OSA (obstructive sleep apnea)    on CPAP   Polycythemia vera (HCC)    PONV (postoperative nausea and vomiting)    Didn't go to sleep all the way  during colonoscopy.   Renal calculus, right    Right ureteral stone    Spinal  stenosis     Surgical History: Past Surgical History:  Procedure Laterality Date   ABLATION     unquestionable   BIOPSY  05/01/2018   Procedure: BIOPSY;  Surgeon: Rush Landmark Telford Nab., MD;  Location: [facility];  Service: Gastroenterology;;   BIOPSY  05/24/2020   Procedure: BIOPSY;  Surgeon: Irving Copas., MD;  Location: [facility];  Service: Gastroenterology;;   CESAREAN SECTION     CHOLECYSTECTOMY     COLONOSCOPY     COLONOSCOPY WITH PROPOFOL N/A 05/24/2020   Procedure: COLONOSCOPY WITH PROPOFOL;  Surgeon: Irving Copas., MD;  Location: [facility];  Service: Gastroenterology;  Laterality: N/A;   ESOPHAGOGASTRODUODENOSCOPY     ESOPHAGOGASTRODUODENOSCOPY N/A 05/01/2018   Procedure: ESOPHAGOGASTRODUODENOSCOPY (EGD);  Surgeon: Irving Copas., MD;  Location: [facility];  Service: Gastroenterology;  Laterality: N/A;   ESOPHAGOGASTRODUODENOSCOPY (EGD) WITH PROPOFOL N/A 05/24/2020   Procedure: ESOPHAGOGASTRODUODENOSCOPY (EGD) WITH PROPOFOL;  Surgeon: Rush Landmark Telford Nab., MD;  Location: [facility];  Service: Gastroenterology;  Laterality: N/A;   EUS N/A 05/01/2018   Procedure: UPPER ENDOSCOPIC ULTRASOUND (EUS) RADIAL;  Surgeon: Irving Copas., MD;  Location: [facility];  Service: Gastroenterology;  Laterality: N/A;   EXTRACORPOREAL SHOCK WAVE LITHOTRIPSY  02/14/2010   HYSTEROSCOPY WITH D & C N/A 11/27/2012   Procedure: DILATATION AND CURETTAGE /HYSTEROSCOPY;  Surgeon: Marylynn Pearson, MD;  Location: [facility];  Service: Gynecology;  Laterality: N/A;   SUBMUCOSAL LIFTING INJECTION  05/24/2020   Procedure: SUBMUCOSAL LIFTING INJECTION;  Surgeon: Rush Landmark Telford Nab., MD;  Location: [facility];  Service: Gastroenterology;;   WISDOM TOOTH EXTRACTION      Home Medications:  Allergies as of 03/23/2022   No Known Allergies      Medication List        Accurate as of March 23, 2022 11:59 PM. If you have any questions, ask your nurse or  doctor.          amoxicillin 500 MG tablet Commonly known as: AMOXIL Take by mouth.   aspirin EC 81 MG tablet Take 81 mg by mouth daily.   Calcium-Vitamin D 600-400 MG-UNIT Tabs Take 1 tablet by mouth daily.   celecoxib 200 MG capsule Commonly known as: CELEBREX Take 200 mg by mouth daily.   clobetasol cream 0.05 % Commonly known as: TEMOVATE Apply 1 application topically daily as needed (for psoriasis).   fish oil-omega-3 fatty acids 1000 MG capsule Take 1 g by mouth daily.   Golimumab 50 MG/0.5ML Soaj Inject 50 mg into the skin every 30 (thirty) days.   HYDROcodone-acetaminophen 7.5-325 MG tablet Commonly known as: NORCO Take 1 tablet by mouth every 6 (six) hours as needed for moderate pain.   omeprazole 40 MG capsule Commonly known as: PRILOSEC Take 1 capsule (40 mg total) by mouth 2 (two) times daily before a meal.   ondansetron 4 MG disintegrating tablet Commonly known as: ZOFRAN-ODT Take 1 tablet (4 mg total) by mouth every 8 (eight) hours as needed for nausea or vomiting. Started by: Zara Council, PA-C   PROBIOTIC PO Take 1 capsule by mouth daily.   rosuvastatin 5 MG tablet Commonly known as: CRESTOR Take 5 mg by mouth daily.   tamsulosin 0.4 MG Caps capsule Commonly known as: FLOMAX Take 1 capsule (0.4 mg total) by mouth daily. Started by: Zara Council, PA-C   tiZANidine 2 MG tablet Commonly known as: ZANAFLEX Take 2 mg by mouth 3 (three) times daily as needed.   Turmeric Curcumin 500 MG Caps See admin instructions.   Vitamin D 50 MCG (2000 UT) Caps Take 2,000 Units by mouth daily.   vortioxetine HBr 20 MG Tabs tablet Commonly known as: TRINTELLIX Take 20 mg by mouth daily.        Allergies: No Known Allergies  Family History: Family History  Problem Relation Age of Onset   Breast cancer Maternal Aunt    Esophageal cancer Mother    Lung cancer Father    CAD Father    Colon cancer Neg Hx    Inflammatory bowel disease Neg  Hx    Liver disease Neg Hx    Pancreatic cancer Neg Hx    Stomach cancer Neg Hx    Rectal cancer Neg Hx     Social History:  reports that she quit smoking about 31 years ago. Her smoking use included cigarettes. She has been exposed to tobacco smoke. She has never used smokeless tobacco. She reports current alcohol use of about 7.0 standard drinks of alcohol per week. She reports that she does not use drugs.  ROS: Pertinent ROS in HPI  Physical Exam: BP (!) 143/82   Pulse 66   Ht '5\' 6"'$  (1.676 m)   Wt 215 lb (97.5 kg)   LMP 09/24/2012   BMI 34.70 kg/m   Constitutional:  Well nourished. Alert and oriented, No acute distress. HEENT: [place] AT, moist mucus membranes.  Trachea  midline Cardiovascular: No clubbing, cyanosis, or edema. Respiratory: Normal respiratory effort, no increased work of breathing. Neurologic: Grossly intact, no focal deficits, moving all 4 extremities. Psychiatric: Normal mood and affect.    Laboratory Data: Serum creatinine (07/2021) 0.96 Urinalysis See HPI and EPIC I have reviewed the labs.   Pertinent Imaging:    A Narrative & Impression  CLINICAL DATA:  Kidney stone   EXAM: ABDOMEN - 1 VIEW   COMPARISON:  Prior abdominal radiograph 12/03/2020   FINDINGS: Right lower pole radiopacities again noted. However, the total length of the 2 radiopacities in the right lower pole is only 1 point 3 cm compared to 1.9 cm previously. The most central of the 3 stones is now identified in the region of the proximal ureter at the level of the L3 vertebral body. No stones visualized overlying the left renal shadow. Surgical clips in the right upper quadrant and right abdomen. No acute osseous abnormality.   IMPRESSION: 1. Interval mobilization of 1 of the 3 right lower pole renal stones into the proximal right ureter. 2. 2 stones remain within the lower pole of the right renal shadow.     Electronically Signed   By: Jacqulynn Cadet M.D.   On:  03/25/2022 17:34    I have independently reviewed the films.  See HPI.     Assessment & Plan:    1. Right ureteral stone -We discussed various treatment options for urolithiasis including observation with or without medical expulsive therapy, shockwave lithotripsy (SWL), ureteroscopy and laser lithotripsy with stent placement. -We discussed that management is based on stone size, location, density, patient co-morbidities, and patient preference.  -Stones <58m in size have a >80% spontaneous passage rate. Data surrounding the use of tamsulosin for medical expulsive therapy is controversial, but meta analyses suggests it is most efficacious for distal stones between 5-153min size. Possible side effects include dizziness/lightheadedness -SWL has a lower stone free rate in a single procedure, but also a lower complication rate compared to ureteroscopy and avoids a stent and associated stent related symptoms. Possible complications include renal hematoma, steinstrasse, and need for additional treatment. -Ureteroscopy with laser lithotripsy and stent placement has a higher stone free rate than SWL in a single procedure, however increased complication rate including possible infection, ureteral injury, bleeding, and stent related morbidity. Common stent related symptoms include dysuria, urgency/frequency, and flank pain. -she would like to proceed with right ESWL   -Zofran script sent to pharmacy -tamsulosin script sent to pharmacy -she is on chronic pain medications for her DDD  2. High risk hematuria -secondary to stones -UA >30 RBC's -urine culture is pending   Return for right ESWL .  These notes generated with voice recognition software. I apologize for typographical errors.  SHSeven SpringsPARodman27[address] Sugar StreetSuWest GoshenuMaypearlNC [phone]

## 2022-03-22 NOTE — H&P (View-Only) (Signed)
03/23/2022 11:15 PM   Caitlin Moran 21-Jan-1961 [id number]  Referring provider: Deland Pretty, MD [address] [place],  [place] [address]  Urological history: 1. High risk hematuria -former smoker -MRI (2022) - no GU pathology -CT (2022) -nonobstructive stones of the lower pole of the right kidney -cysto (03/2021) - NED -no reports of gross heme -UA . 30 RBC's   2. Nephrolithiasis -CT (2022) - Cluster of nonobstructing stones seen in the lower pole of the right kidney, stone burden  -KUB 6 mm right mid ureteral stone and two lower pole right renal stones    Chief Complaint  Patient presents with   Acute Visit   Nephrolithiasis    HPI: Caitlin Moran is a 62 y.o. female who presents today for possible stone.    PVR 0 mL   UA yellow cloudy, specific gravity greater than 1.030, 3+ blood, pH 5.5, 3+ protein, 0-5 WBCs, greater than 30 RBCs, 0-10 epithelial cells, granular and hyaline casts are present, calcium oxalate crystals are present mucus is present and moderate bacteria.  KUB 6 mm right ureteral stone and two lower pole right renal stones   She has been experiencing right flank pain that radiates to the right waist associated with nausea and vomiting off and on since December.    Patient denies any modifying or aggravating factors.  Patient denies any gross hematuria, dysuria or suprapubic/flank pain.  Patient denies any fevers and chills.   PMH: Past Medical History:  Diagnosis Date   Anxiety    Arthritis    neck   Depression    GERD (gastroesophageal reflux disease)    Headache    History of kidney stones    kidney stone -lithrotrispy 04/30/18- 2 current - stable   IBS (irritable bowel syndrome)    OSA (obstructive sleep apnea)    on CPAP   Polycythemia vera (HCC)    PONV (postoperative nausea and vomiting)    Didn't go to sleep all the way  during colonoscopy.   Renal calculus, right    Right ureteral stone    Spinal  stenosis     Surgical History: Past Surgical History:  Procedure Laterality Date   ABLATION     unquestionable   BIOPSY  05/01/2018   Procedure: BIOPSY;  Surgeon: Rush Landmark Telford Nab., MD;  Location: [facility];  Service: Gastroenterology;;   BIOPSY  05/24/2020   Procedure: BIOPSY;  Surgeon: Irving Copas., MD;  Location: [facility];  Service: Gastroenterology;;   CESAREAN SECTION     CHOLECYSTECTOMY     COLONOSCOPY     COLONOSCOPY WITH PROPOFOL N/A 05/24/2020   Procedure: COLONOSCOPY WITH PROPOFOL;  Surgeon: Irving Copas., MD;  Location: [facility];  Service: Gastroenterology;  Laterality: N/A;   ESOPHAGOGASTRODUODENOSCOPY     ESOPHAGOGASTRODUODENOSCOPY N/A 05/01/2018   Procedure: ESOPHAGOGASTRODUODENOSCOPY (EGD);  Surgeon: Irving Copas., MD;  Location: [facility];  Service: Gastroenterology;  Laterality: N/A;   ESOPHAGOGASTRODUODENOSCOPY (EGD) WITH PROPOFOL N/A 05/24/2020   Procedure: ESOPHAGOGASTRODUODENOSCOPY (EGD) WITH PROPOFOL;  Surgeon: Rush Landmark Telford Nab., MD;  Location: [facility];  Service: Gastroenterology;  Laterality: N/A;   EUS N/A 05/01/2018   Procedure: UPPER ENDOSCOPIC ULTRASOUND (EUS) RADIAL;  Surgeon: Irving Copas., MD;  Location: [facility];  Service: Gastroenterology;  Laterality: N/A;   EXTRACORPOREAL SHOCK WAVE LITHOTRIPSY  02/14/2010   HYSTEROSCOPY WITH D & C N/A 11/27/2012   Procedure: DILATATION AND CURETTAGE /HYSTEROSCOPY;  Surgeon: Marylynn Pearson, MD;  Location: [facility];  Service: Gynecology;  Laterality: N/A;   SUBMUCOSAL LIFTING INJECTION  05/24/2020   Procedure: SUBMUCOSAL LIFTING INJECTION;  Surgeon: Rush Landmark Telford Nab., MD;  Location: [facility];  Service: Gastroenterology;;   WISDOM TOOTH EXTRACTION      Home Medications:  Allergies as of 03/23/2022   No Known Allergies      Medication List        Accurate as of March 23, 2022 11:59 PM. If you have any questions, ask your nurse or  doctor.          amoxicillin 500 MG tablet Commonly known as: AMOXIL Take by mouth.   aspirin EC 81 MG tablet Take 81 mg by mouth daily.   Calcium-Vitamin D 600-400 MG-UNIT Tabs Take 1 tablet by mouth daily.   celecoxib 200 MG capsule Commonly known as: CELEBREX Take 200 mg by mouth daily.   clobetasol cream 0.05 % Commonly known as: TEMOVATE Apply 1 application topically daily as needed (for psoriasis).   fish oil-omega-3 fatty acids 1000 MG capsule Take 1 g by mouth daily.   Golimumab 50 MG/0.5ML Soaj Inject 50 mg into the skin every 30 (thirty) days.   HYDROcodone-acetaminophen 7.5-325 MG tablet Commonly known as: NORCO Take 1 tablet by mouth every 6 (six) hours as needed for moderate pain.   omeprazole 40 MG capsule Commonly known as: PRILOSEC Take 1 capsule (40 mg total) by mouth 2 (two) times daily before a meal.   ondansetron 4 MG disintegrating tablet Commonly known as: ZOFRAN-ODT Take 1 tablet (4 mg total) by mouth every 8 (eight) hours as needed for nausea or vomiting. Started by: Zara Council, PA-C   PROBIOTIC PO Take 1 capsule by mouth daily.   rosuvastatin 5 MG tablet Commonly known as: CRESTOR Take 5 mg by mouth daily.   tamsulosin 0.4 MG Caps capsule Commonly known as: FLOMAX Take 1 capsule (0.4 mg total) by mouth daily. Started by: Zara Council, PA-C   tiZANidine 2 MG tablet Commonly known as: ZANAFLEX Take 2 mg by mouth 3 (three) times daily as needed.   Turmeric Curcumin 500 MG Caps See admin instructions.   Vitamin D 50 MCG (2000 UT) Caps Take 2,000 Units by mouth daily.   vortioxetine HBr 20 MG Tabs tablet Commonly known as: TRINTELLIX Take 20 mg by mouth daily.        Allergies: No Known Allergies  Family History: Family History  Problem Relation Age of Onset   Breast cancer Maternal Aunt    Esophageal cancer Mother    Lung cancer Father    CAD Father    Colon cancer Neg Hx    Inflammatory bowel disease Neg  Hx    Liver disease Neg Hx    Pancreatic cancer Neg Hx    Stomach cancer Neg Hx    Rectal cancer Neg Hx     Social History:  reports that she quit smoking about 31 years ago. Her smoking use included cigarettes. She has been exposed to tobacco smoke. She has never used smokeless tobacco. She reports current alcohol use of about 7.0 standard drinks of alcohol per week. She reports that she does not use drugs.  ROS: Pertinent ROS in HPI  Physical Exam: BP (!) 143/82   Pulse 66   Ht '5\' 6"'$  (1.676 m)   Wt 215 lb (97.5 kg)   LMP 09/24/2012   BMI 34.70 kg/m   Constitutional:  Well nourished. Alert and oriented, No acute distress. HEENT: [place] AT, moist mucus membranes.  Trachea  midline Cardiovascular: No clubbing, cyanosis, or edema. Respiratory: Normal respiratory effort, no increased work of breathing. Neurologic: Grossly intact, no focal deficits, moving all 4 extremities. Psychiatric: Normal mood and affect.    Laboratory Data: Serum creatinine (07/2021) 0.96 Urinalysis See HPI and EPIC I have reviewed the labs.   Pertinent Imaging:    A Narrative & Impression  CLINICAL DATA:  Kidney stone   EXAM: ABDOMEN - 1 VIEW   COMPARISON:  Prior abdominal radiograph 12/03/2020   FINDINGS: Right lower pole radiopacities again noted. However, the total length of the 2 radiopacities in the right lower pole is only 1 point 3 cm compared to 1.9 cm previously. The most central of the 3 stones is now identified in the region of the proximal ureter at the level of the L3 vertebral body. No stones visualized overlying the left renal shadow. Surgical clips in the right upper quadrant and right abdomen. No acute osseous abnormality.   IMPRESSION: 1. Interval mobilization of 1 of the 3 right lower pole renal stones into the proximal right ureter. 2. 2 stones remain within the lower pole of the right renal shadow.     Electronically Signed   By: Jacqulynn Cadet M.D.   On:  03/25/2022 17:34    I have independently reviewed the films.  See HPI.     Assessment & Plan:    1. Right ureteral stone -We discussed various treatment options for urolithiasis including observation with or without medical expulsive therapy, shockwave lithotripsy (SWL), ureteroscopy and laser lithotripsy with stent placement. -We discussed that management is based on stone size, location, density, patient co-morbidities, and patient preference.  -Stones <54m in size have a >80% spontaneous passage rate. Data surrounding the use of tamsulosin for medical expulsive therapy is controversial, but meta analyses suggests it is most efficacious for distal stones between 5-157min size. Possible side effects include dizziness/lightheadedness -SWL has a lower stone free rate in a single procedure, but also a lower complication rate compared to ureteroscopy and avoids a stent and associated stent related symptoms. Possible complications include renal hematoma, steinstrasse, and need for additional treatment. -Ureteroscopy with laser lithotripsy and stent placement has a higher stone free rate than SWL in a single procedure, however increased complication rate including possible infection, ureteral injury, bleeding, and stent related morbidity. Common stent related symptoms include dysuria, urgency/frequency, and flank pain. -she would like to proceed with right ESWL   -Zofran script sent to pharmacy -tamsulosin script sent to pharmacy -she is on chronic pain medications for her DDD  2. High risk hematuria -secondary to stones -UA >30 RBC's -urine culture is pending   Return for right ESWL .  These notes generated with voice recognition software. I apologize for typographical errors.  [id number] CourtSuHorseshoe Bendu[place]ceana[place] [phone]

## 2022-03-23 ENCOUNTER — Ambulatory Visit
Admission: RE | Admit: 2022-03-23 | Discharge: 2022-03-23 | Disposition: A | Discharge status: Home / Self Care | Payer: BC Managed Care – PPO | Source: Ambulatory Visit | Attending: Urology | Admitting: Urology

## 2022-03-23 ENCOUNTER — Telehealth: Payer: Self-pay | Admitting: Family Medicine

## 2022-03-23 ENCOUNTER — Encounter: Payer: Self-pay | Admitting: Urology

## 2022-03-23 ENCOUNTER — Ambulatory Visit: Payer: BC Managed Care – PPO | Admitting: Urology

## 2022-03-23 ENCOUNTER — Other Ambulatory Visit: Payer: Self-pay | Admitting: Urology

## 2022-03-23 VITALS — BP 143/82 | HR 66 | Ht 66.0 in | Wt 215.0 lb

## 2022-03-23 DIAGNOSIS — N201 Calculus of ureter: Secondary | ICD-10-CM

## 2022-03-23 DIAGNOSIS — N200 Calculus of kidney: Secondary | ICD-10-CM

## 2022-03-23 DIAGNOSIS — N202 Calculus of kidney with calculus of ureter: Secondary | ICD-10-CM | POA: Diagnosis not present

## 2022-03-23 DIAGNOSIS — G4733 Obstructive sleep apnea (adult) (pediatric): Secondary | ICD-10-CM

## 2022-03-23 DIAGNOSIS — R3129 Other microscopic hematuria: Secondary | ICD-10-CM

## 2022-03-23 LAB — MICROSCOPIC EXAMINATION: RBC, Urine: >30 /hpf — AB (ref 0–2)

## 2022-03-23 LAB — URINALYSIS, COMPLETE
Bilirubin, UA: NEGATIVE
Glucose, UA: NEGATIVE
Ketones, UA: NEGATIVE
Leukocytes,UA: NEGATIVE
Nitrite, UA: NEGATIVE
Specific Gravity, UA: 1.03 (ref 1.005–1.030)
Urobilinogen, Ur: 0.2 mg/dL (ref 0.2–1.0)
pH, UA: 5.5 (ref 5.0–7.5)

## 2022-03-23 LAB — BLADDER SCAN AMB NON-IMAGING

## 2022-03-23 MED ORDER — TAMSULOSIN HCL 0.4 MG PO CAPS: 0.4000 mg | ORAL_CAPSULE | Freq: Every day | ORAL | 3 refills | Status: DC

## 2022-03-23 MED ORDER — ONDANSETRON 4 MG PO TBDP: 4.0000 mg | ORAL_TABLET | Freq: Three times a day (TID) | ORAL | 0 refills | Status: DC | PRN

## 2022-03-23 NOTE — Telephone Encounter (Signed)
Please let her know that I have reviewed the most recent compliance report. Her usage looks great from a daily standpoint. It is acceptable from 4 our usage but hovering around 73%. Her apneic events continue to be elevated around 24/h despite the change in pressure from 5-20. If she is willing I would like for her to come in for a repeat titration study to assess pressure settings. If willing, I will place orders. We could have her follow up with Dr Brett Fairy at first available to another set of eyes as well if she would rather. Please remind her to avoid sleeping on her back. TY!

## 2022-03-23 NOTE — Progress Notes (Signed)
ESWL ORDER FORM  Expected date of procedure: 04/06/2022  Surgeon: Nickolas Madrid, MD  Post op standing: 2-4wk follow up w/KUB prior  Anticoagulation/Aspirin/NSAID standing order: Hold all 72 hours prior  Anesthesia standing order: MAC  VTE standing: SCD's  Dx: Right Ureteral Stone  Procedure: right Extracorporeal shock wave lithotripsy  CPT : 21624  Standing Order Set:   *NPO after mn, KUB  *NS 150m/hr, Keflex 5047mPO, Benadryl 2521mO, Valium 66m90m, Zofran 4mg 31m   Medications if other than standing orders:   NONE

## 2022-03-26 LAB — CULTURE, URINE COMPREHENSIVE

## 2022-03-27 ENCOUNTER — Telehealth: Payer: Self-pay | Admitting: Family Medicine

## 2022-03-27 MED ORDER — LEVOFLOXACIN 750 MG PO TABS: 750.0000 mg | ORAL_TABLET | Freq: Every day | ORAL | 0 refills | Status: DC

## 2022-03-27 NOTE — Telephone Encounter (Signed)
Patient notified and voiced understanding. ABX sent to pharmacy.  

## 2022-03-27 NOTE — Addendum Note (Signed)
Addended by: Wyvonnia Lora on: 03/27/2022 01:22 PM   Modules accepted: Orders

## 2022-03-27 NOTE — Telephone Encounter (Signed)
-----  Message from Nori Riis, PA-C sent at 03/26/2022  9:47 PM EST ----- Please let Caitlin Moran know that her urine culture was positive for infection.  I would like for her to start Levaquin 750 mg, once daily for five days.

## 2022-03-29 MED ORDER — ONDANSETRON HCL 4 MG/2ML IJ SOLN: 4.0000 mg | Freq: Once | INTRAMUSCULAR | Status: AC

## 2022-03-29 MED ORDER — SODIUM CHLORIDE 0.9 % IV SOLN: INTRAVENOUS | Status: DC

## 2022-03-29 MED ORDER — DIPHENHYDRAMINE HCL 25 MG PO CAPS: 25.0000 mg | ORAL_CAPSULE | ORAL | Status: AC

## 2022-03-29 MED ORDER — CEPHALEXIN 500 MG PO CAPS: 500.0000 mg | ORAL_CAPSULE | Freq: Once | ORAL | Status: AC

## 2022-03-29 MED ORDER — DIAZEPAM 5 MG PO TABS: 10.0000 mg | ORAL_TABLET | ORAL | Status: AC

## 2022-03-30 ENCOUNTER — Other Ambulatory Visit: Payer: Self-pay

## 2022-03-30 ENCOUNTER — Encounter: Payer: Self-pay | Admitting: Urology

## 2022-03-30 ENCOUNTER — Ambulatory Visit: Payer: BC Managed Care – PPO

## 2022-03-30 ENCOUNTER — Ambulatory Visit
Admission: RE | Admit: 2022-03-30 | Discharge: 2022-03-30 | Disposition: A | Discharge status: Home / Self Care | Payer: BC Managed Care – PPO | Attending: Urology | Admitting: Urology

## 2022-03-30 ENCOUNTER — Encounter
Admission: RE | Disposition: A | Discharge status: Home / Self Care | Payer: Self-pay | Source: Home / Self Care | Attending: Urology

## 2022-03-30 DIAGNOSIS — Z6834 Body mass index (BMI) 34.0-34.9, adult: Secondary | ICD-10-CM | POA: Insufficient documentation

## 2022-03-30 DIAGNOSIS — G4730 Sleep apnea, unspecified: Secondary | ICD-10-CM | POA: Diagnosis not present

## 2022-03-30 DIAGNOSIS — N200 Calculus of kidney: Secondary | ICD-10-CM

## 2022-03-30 DIAGNOSIS — Z7982 Long term (current) use of aspirin: Secondary | ICD-10-CM | POA: Insufficient documentation

## 2022-03-30 DIAGNOSIS — G4733 Obstructive sleep apnea (adult) (pediatric): Secondary | ICD-10-CM | POA: Diagnosis not present

## 2022-03-30 DIAGNOSIS — R319 Hematuria, unspecified: Secondary | ICD-10-CM | POA: Diagnosis not present

## 2022-03-30 DIAGNOSIS — E669 Obesity, unspecified: Secondary | ICD-10-CM | POA: Insufficient documentation

## 2022-03-30 DIAGNOSIS — Z01818 Encounter for other preprocedural examination: Secondary | ICD-10-CM | POA: Diagnosis not present

## 2022-03-30 DIAGNOSIS — N202 Calculus of kidney with calculus of ureter: Secondary | ICD-10-CM | POA: Diagnosis not present

## 2022-03-30 HISTORY — PX: EXTRACORPOREAL SHOCK WAVE LITHOTRIPSY: SHX1557

## 2022-03-30 SURGERY — LITHOTRIPSY, ESWL
Anesthesia: Moderate Sedation | Laterality: Right

## 2022-03-30 MED ORDER — CEPHALEXIN 500 MG PO CAPS
ORAL_CAPSULE | ORAL | Status: AC
  Administered 2022-03-30: 500 mg via ORAL
  Filled 2022-03-30: qty 1

## 2022-03-30 MED ORDER — ONDANSETRON HCL 4 MG/2ML IJ SOLN
INTRAMUSCULAR | Status: AC
  Administered 2022-03-30: 4 mg via INTRAVENOUS
  Filled 2022-03-30: qty 2

## 2022-03-30 MED ORDER — DIAZEPAM 5 MG PO TABS
ORAL_TABLET | ORAL | Status: AC
  Administered 2022-03-30: 10 mg via ORAL
  Filled 2022-03-30: qty 2

## 2022-03-30 MED ORDER — DIPHENHYDRAMINE HCL 25 MG PO CAPS
ORAL_CAPSULE | ORAL | Status: AC
  Administered 2022-03-30: 25 mg via ORAL
  Filled 2022-03-30: qty 1

## 2022-03-30 NOTE — Interval H&P Note (Signed)
History and Physical Interval Note:  CV:RRR Lungs:clear  03/30/2022 12:12 PM  Caitlin Moran  has presented today for surgery, with the diagnosis of Right Ureteral Stone.  The various methods of treatment have been discussed with the patient and family. After consideration of risks, benefits and other options for treatment, the patient has consented to  Procedure(s): EXTRACORPOREAL SHOCK WAVE LITHOTRIPSY (ESWL) (Right) as a surgical intervention.  The patient's history has been reviewed, patient examined, no change in status, stable for surgery.  I have reviewed the patient's chart and labs.  Questions were answered to the patient's satisfaction.     [place]

## 2022-03-30 NOTE — Discharge Instructions (Addendum)
As per the [facility] discharge instructions Continue hydrocodone as needed Continue tamsulosin which will help you pass stone fragments  Call [facility] at [phone] for pain not controlled with oral medications or fever greater than 101 degrees You will be contacted for a follow-up appointment to be scheduled in 2-3 weeks

## 2022-03-31 ENCOUNTER — Other Ambulatory Visit: Payer: Self-pay

## 2022-03-31 ENCOUNTER — Encounter: Payer: Self-pay | Admitting: Urology

## 2022-03-31 DIAGNOSIS — N200 Calculus of kidney: Secondary | ICD-10-CM

## 2022-04-05 ENCOUNTER — Telehealth: Payer: Self-pay

## 2022-04-05 NOTE — Telephone Encounter (Signed)
Okay to order a KUB

## 2022-04-05 NOTE — Telephone Encounter (Signed)
Patient left voicemail on triage line on 04/04/22 at 4:55 pm, patient stated she had lithotripsy on 03/30/22. The last 2 days has had right side pain radiating a little to the right abd area in the front, no back pain. No other symptoms she noticed per her voicemail. Patient wanted to know if she needed to be seen or get any imaging done.  Called patient back and left her a voicemail to call us back.

## 2022-04-05 NOTE — Telephone Encounter (Signed)
Patient called back and left another message. Patient states pain is a little better today. Patient wanted to ask if she needs imaging done again. She does have follow up on 04/20/22 with Sam

## 2022-04-06 NOTE — Telephone Encounter (Signed)
Left detailed message for patient with this information and asked to have patient call us back to let us know if she would like to proceed with this

## 2022-04-18 ENCOUNTER — Telehealth: Payer: Self-pay | Admitting: Family Medicine

## 2022-04-18 NOTE — Telephone Encounter (Signed)
LVM for pt to call back to schedule   [employer] no auth req spoke to [place] ref # [id number]

## 2022-04-20 ENCOUNTER — Ambulatory Visit
Admission: RE | Admit: 2022-04-20 | Discharge: 2022-04-20 | Disposition: A | Discharge status: Home / Self Care | Payer: BC Managed Care – PPO | Source: Ambulatory Visit | Attending: Urology | Admitting: Urology

## 2022-04-20 ENCOUNTER — Encounter: Payer: Self-pay | Admitting: Physician Assistant

## 2022-04-20 ENCOUNTER — Ambulatory Visit: Payer: BC Managed Care – PPO | Admitting: Physician Assistant

## 2022-04-20 ENCOUNTER — Ambulatory Visit
Admission: RE | Admit: 2022-04-20 | Discharge: 2022-04-20 | Disposition: A | Discharge status: Home / Self Care | Payer: BC Managed Care – PPO | Attending: Urology | Admitting: Urology

## 2022-04-20 VITALS — BP 142/84 | HR 62 | Ht 67.0 in | Wt 200.0 lb

## 2022-04-20 DIAGNOSIS — M47816 Spondylosis without myelopathy or radiculopathy, lumbar region: Secondary | ICD-10-CM | POA: Diagnosis not present

## 2022-04-20 DIAGNOSIS — N201 Calculus of ureter: Secondary | ICD-10-CM

## 2022-04-20 DIAGNOSIS — N200 Calculus of kidney: Secondary | ICD-10-CM

## 2022-04-20 LAB — MICROSCOPIC EXAMINATION

## 2022-04-20 LAB — URINALYSIS, COMPLETE
Bilirubin, UA: NEGATIVE
Glucose, UA: NEGATIVE
Ketones, UA: NEGATIVE
Leukocytes,UA: NEGATIVE
Nitrite, UA: NEGATIVE
Protein,UA: NEGATIVE
Specific Gravity, UA: 1.02 (ref 1.005–1.030)
Urobilinogen, Ur: 0.2 mg/dL (ref 0.2–1.0)
pH, UA: 6 (ref 5.0–7.5)

## 2022-04-20 NOTE — Patient Instructions (Addendum)
Today we discussed that it looks like the stone we treated with a shockwave lithotripsy last month has resolved, however one of the stones from the bottom of your right kidney has moved into the top of your right ureter. You still have one stone remaining at the bottom of your right kidney. Fortunately, you are not having any stone symptoms and your urine does not look infected today.  We discussed giving you a chance to pass this new ureteral stone on your own, doing another shockwave lithotripsy on this stone, or undergoing a ureteroscopy surgery which would treat the stone in your ureter and the remaining stone in your kidney in the same procedure. Information about these procedures are at that back of this packet; think about what you would like to do and either call our clinic or send me a MyChart message when you decide how you'd like to proceed. In the meantime, please do the following: -Take Flomax 0.'4mg'$  daily -Stay well hydrated -Treat any pain with ibuprofen/tylenol or Norco -Treat any nausea with Zofran  Please call our office immediately (we are open 8a-5p Monday-Friday) or go to the Emergency Department if you develop any of the following: -Fever/chills -Nausea and/or vomiting uncontrollable with Zofran -Pain uncontrollable with Norco

## 2022-04-20 NOTE — Progress Notes (Signed)
04/20/2022 9:17 AM   Caitlin Moran 1960-07-10 [id number]  CC: Chief Complaint  Patient presents with   Nephrolithiasis   HPI: Caitlin Moran is a 62 y.o. female with PMH microscopic hematuria with benign workup in 2023 and nephrolithiasis who underwent ESWL with Dr. Bernardo Heater on 03/30/2022 for management of a 6 mm proximal right ureteral stone who presents today for postop follow-up.  Operative note describes disappearance of the stone.  Today she reports never saw any stone fragments pass following ESWL, however she is asymptomatic at this time.  KUB today shows clearance of the previously seen proximal right ureteral stone, however there appears to be interval migration of one of her residual right lower pole stones into the proximal right ureter.  In-office UA today positive for trace intact blood; urine microscopy with 3-10 RBCs/HPF.   PMH: Past Medical History:  Diagnosis Date   Anxiety    Arthritis    neck   Depression    GERD (gastroesophageal reflux disease)    Headache    History of kidney stones    kidney stone -lithrotrispy 04/30/18- 2 current - stable   IBS (irritable bowel syndrome)    OSA (obstructive sleep apnea)    on CPAP   Polycythemia vera (HCC)    PONV (postoperative nausea and vomiting)    Didn't go to sleep all the way  during colonoscopy.   Renal calculus, right    Right ureteral stone    Spinal stenosis     Surgical History: Past Surgical History:  Procedure Laterality Date   ABLATION     unquestionable   BIOPSY  05/01/2018   Procedure: BIOPSY;  Surgeon: Rush Landmark Telford Nab., MD;  Location: [facility];  Service: Gastroenterology;;   BIOPSY  05/24/2020   Procedure: BIOPSY;  Surgeon: Irving Copas., MD;  Location: [facility];  Service: Gastroenterology;;   CESAREAN SECTION     CHOLECYSTECTOMY     COLONOSCOPY     COLONOSCOPY WITH PROPOFOL N/A 05/24/2020   Procedure: COLONOSCOPY WITH PROPOFOL;  Surgeon: Irving Copas., MD;  Location: [facility];  Service: Gastroenterology;  Laterality: N/A;   ESOPHAGOGASTRODUODENOSCOPY     ESOPHAGOGASTRODUODENOSCOPY N/A 05/01/2018   Procedure: ESOPHAGOGASTRODUODENOSCOPY (EGD);  Surgeon: Irving Copas., MD;  Location: [facility];  Service: Gastroenterology;  Laterality: N/A;   ESOPHAGOGASTRODUODENOSCOPY (EGD) WITH PROPOFOL N/A 05/24/2020   Procedure: ESOPHAGOGASTRODUODENOSCOPY (EGD) WITH PROPOFOL;  Surgeon: Rush Landmark Telford Nab., MD;  Location: [facility];  Service: Gastroenterology;  Laterality: N/A;   EUS N/A 05/01/2018   Procedure: UPPER ENDOSCOPIC ULTRASOUND (EUS) RADIAL;  Surgeon: Irving Copas., MD;  Location: [facility];  Service: Gastroenterology;  Laterality: N/A;   EXTRACORPOREAL SHOCK WAVE LITHOTRIPSY  02/14/2010   EXTRACORPOREAL SHOCK WAVE LITHOTRIPSY Right 03/30/2022   Procedure: EXTRACORPOREAL SHOCK WAVE LITHOTRIPSY (ESWL);  Surgeon: Abbie Sons, MD;  Location: [facility];  Service: Urology;  Laterality: Right;   HYSTEROSCOPY WITH D & C N/A 11/27/2012   Procedure: DILATATION AND CURETTAGE /HYSTEROSCOPY;  Surgeon: Marylynn Pearson, MD;  Location: [facility];  Service: Gynecology;  Laterality: N/A;   SUBMUCOSAL LIFTING INJECTION  05/24/2020   Procedure: SUBMUCOSAL LIFTING INJECTION;  Surgeon: Rush Landmark Telford Nab., MD;  Location: [facility];  Service: Gastroenterology;;   WISDOM TOOTH EXTRACTION      Home Medications:  Allergies as of 04/20/2022   No Known Allergies      Medication List        Accurate as of April 20, 2022  9:17 AM. If  you have any questions, ask your nurse or doctor.          aspirin EC 81 MG tablet Take 81 mg by mouth daily.   Calcium-Vitamin D 600-400 MG-UNIT Tabs Take 1 tablet by mouth daily.   celecoxib 200 MG capsule Commonly known as: CELEBREX Take 200 mg by mouth daily.   clobetasol cream 0.05 % Commonly known as: TEMOVATE Apply 1 application topically daily as needed (for psoriasis).    fish oil-omega-3 fatty acids 1000 MG capsule Take 1 g by mouth daily.   Golimumab 50 MG/0.5ML Soaj Inject 50 mg into the skin every 30 (thirty) days.   HYDROcodone-acetaminophen 7.5-325 MG tablet Commonly known as: NORCO Take 1 tablet by mouth every 6 (six) hours as needed for moderate pain.   levofloxacin 750 MG tablet Commonly known as: Levaquin Take 1 tablet (750 mg total) by mouth daily.   methocarbamol 500 MG tablet Commonly known as: ROBAXIN TAKE 1 TABLET BY MOUTH EVERY 8 HOURS AS NEEDED FOR SPASM   omeprazole 40 MG capsule Commonly known as: PRILOSEC Take 1 capsule (40 mg total) by mouth 2 (two) times daily before a meal.   ondansetron 4 MG disintegrating tablet Commonly known as: ZOFRAN-ODT Take 1 tablet (4 mg total) by mouth every 8 (eight) hours as needed for nausea or vomiting.   predniSONE 5 MG tablet Commonly known as: DELTASONE Take 10 mg by mouth 2 (two) times daily.   PROBIOTIC PO Take 1 capsule by mouth daily.   rosuvastatin 5 MG tablet Commonly known as: CRESTOR Take 5 mg by mouth daily.   tamsulosin 0.4 MG Caps capsule Commonly known as: FLOMAX Take 1 capsule (0.4 mg total) by mouth daily.   tiZANidine 2 MG tablet Commonly known as: ZANAFLEX Take 2 mg by mouth 3 (three) times daily as needed.   Turmeric Curcumin 500 MG Caps See admin instructions.   Vitamin D 50 MCG (2000 UT) Caps Take 2,000 Units by mouth daily.   vortioxetine HBr 20 MG Tabs tablet Commonly known as: TRINTELLIX Take 20 mg by mouth daily.        Allergies:  No Known Allergies  Family History: Family History  Problem Relation Age of Onset   Breast cancer Maternal Aunt    Esophageal cancer Mother    Lung cancer Father    CAD Father    Colon cancer Neg Hx    Inflammatory bowel disease Neg Hx    Liver disease Neg Hx    Pancreatic cancer Neg Hx    Stomach cancer Neg Hx    Rectal cancer Neg Hx     Social History:   reports that she quit smoking about 31  years ago. Her smoking use included cigarettes. She has been exposed to tobacco smoke. She has never used smokeless tobacco. She reports current alcohol use of about 7.0 standard drinks of alcohol per week. She reports that she does not use drugs.  Physical Exam: BP (!) 142/84   Pulse 62   Ht 5' 7"$  (1.702 m)   Wt 200 lb (90.7 kg)   LMP 09/24/2012   BMI 31.32 kg/m   Constitutional:  Alert and oriented, no acute distress, nontoxic appearing HEENT: , AT Cardiovascular: No clubbing, cyanosis, or edema Respiratory: Normal respiratory effort, no increased work of breathing Skin: No rashes, bruises or suspicious lesions Neurologic: Grossly intact, no focal deficits, moving all 4 extremities Psychiatric: Normal mood and affect  Laboratory Data: Results for orders placed or performed in visit  on 04/20/22  Microscopic Examination   Urine  Result Value Ref Range   WBC, UA 0-5 0 - 5 /hpf   RBC, Urine 3-10 (A) 0 - 2 /hpf   Epithelial Cells (non renal) 0-10 0 - 10 /hpf   Bacteria, UA Few None seen/Few  Urinalysis, Complete  Result Value Ref Range   Specific Gravity, UA 1.020 1.005 - 1.030   pH, UA 6.0 5.0 - 7.5   Color, UA Yellow Yellow   Appearance Ur Clear Clear   Leukocytes,UA Negative Negative   Protein,UA Negative Negative/Trace   Glucose, UA Negative Negative   Ketones, UA Negative Negative   RBC, UA Trace (A) Negative   Bilirubin, UA Negative Negative   Urobilinogen, Ur 0.2 0.2 - 1.0 mg/dL   Nitrite, UA Negative Negative   Microscopic Examination See below:    Pertinent Imaging: KUB, 04/20/2022: CLINICAL DATA:  Follow-up lower pole right renal calculus treated with lithotripsy three weeks ago.   EXAM: ABDOMEN - 1 VIEW   COMPARISON:  03/30/2022   FINDINGS: Normal bowel-gas pattern. Moderate amount of stool in the colon. 8 mm calculus or stone fragment in the lower pole of the right kidney and 6 mm calculus or stone fragment in the proximal right ureter. Additionalr 3  mm linear stone fragment in the proximal to mid right ureter.   No additional urinary tract calculi or fragments visualized. Mild lumbar spine degenerative changes.   IMPRESSION: 1. 8 mm calculus or stone fragment in the lower pole of the right kidney and 6 mm calculus or stone fragment in the proximal right ureter. 2. Additional 3 mm linear stone fragment in the proximal to mid right ureter.     Electronically Signed   By: Claudie Revering M.D.   On: 04/21/2022 20:14  I personally reviewed the images referenced above and note interval resolution of the prior proximal right ureteral stone with interval migration of one of her right lower pole stones into the proximal right ureter as well as a stable right lower pole stone.  Assessment & Plan:   1. Right ureteral stone No fragments captured, however her symptoms have resolved.  She has some persistent mild microscopic hematuria today consistent with her baseline.  We discussed that it appears that her previously seen proximal right ureteral stone has resolved, however there appears to be interval migration of one of her right lower pole stones into the proximal right ureter again.  She has 1 remaining right lower pole stone at this time.  We discussed various treatment options for her stones including trial of passage vs. ESWL vs. ureteroscopy with laser lithotripsy and stent.   We specifically discussed that ESWL is a less invasive procedure that requires less anesthesia, however with her proximal stones I would like her to space these out at least 6 weeks apart to reduce her risk for perinephric hematoma. Additionally, we discussed the limitations of ESWL including the ability to only treat one stone in a single treatment. By comparison, ureteroscopy is a more invasive surgical procedure that requires more anesthesia, but we can potentially treat both her ureteral and renal stones in a single procedure. However, URS does require placement of  a ureteral stent, which will remain in place for approximately 3-10 days and can be associated with flank pain, bladder pain, dysuria, urgency, frequency, urinary leakage, and gross hematuria.  She is undecided how she would like to proceed at this time.  Will have her start a trial of passage and  resume Flomax for now.  I gave her additional information about both ureteroscopy and ESWL and she will contact me with how she desires to proceed.  Will obtain urine culture today for possible preop purposes, low suspicion for infection at this time. - Urinalysis, Complete - CULTURE, URINE COMPREHENSIVE   Return for Patient to call with desired plan.  Debroah Loop, PA-C  [facility] [address], [place], [place] [address] [phone]

## 2022-04-26 LAB — CULTURE, URINE COMPREHENSIVE

## 2022-04-27 ENCOUNTER — Telehealth: Payer: Self-pay | Admitting: Family Medicine

## 2022-04-27 DIAGNOSIS — R7982 Elevated C-reactive protein (CRP): Secondary | ICD-10-CM | POA: Diagnosis not present

## 2022-04-27 DIAGNOSIS — G8929 Other chronic pain: Secondary | ICD-10-CM | POA: Diagnosis not present

## 2022-04-27 DIAGNOSIS — L4050 Arthropathic psoriasis, unspecified: Secondary | ICD-10-CM | POA: Diagnosis not present

## 2022-04-27 DIAGNOSIS — Z79899 Other long term (current) drug therapy: Secondary | ICD-10-CM | POA: Diagnosis not present

## 2022-04-27 DIAGNOSIS — E785 Hyperlipidemia, unspecified: Secondary | ICD-10-CM | POA: Diagnosis not present

## 2022-04-27 DIAGNOSIS — L409 Psoriasis, unspecified: Secondary | ICD-10-CM | POA: Diagnosis not present

## 2022-04-27 DIAGNOSIS — L4059 Other psoriatic arthropathy: Secondary | ICD-10-CM | POA: Diagnosis not present

## 2022-04-27 NOTE — Telephone Encounter (Signed)
LMOM for patient to return call.

## 2022-04-27 NOTE — Telephone Encounter (Signed)
-----   Message from [place], [place] sent at 04/27/2022  9:27 AM EST ----- Please call her and ask if she's having any symptoms of urinary infection, and also if she's thought any more about what she'd like to do about her stones.

## 2022-04-28 NOTE — Telephone Encounter (Signed)
She is not having UTI symptoms, she is drinking more water and was hoping to pass the stones. She wants to know if you think this is ok?

## 2022-04-28 NOTE — Telephone Encounter (Signed)
LMOM for patient to return call. 2nd attempt

## 2022-05-02 NOTE — Telephone Encounter (Signed)
As long as she is asymptomatic, that should be fine. If she wants to try to pass the stone on her own, let's bring her back in around 3/8 for stone follow-up with UA and KUB prior. If she develops pain or fevers in the meantime, she should contact us immediately.

## 2022-05-03 NOTE — Telephone Encounter (Signed)
LMOM for patient to return call.

## 2022-05-03 NOTE — Telephone Encounter (Signed)
Unable to reach patient, 2nd attempt

## 2022-05-04 ENCOUNTER — Ambulatory Visit: Payer: BC Managed Care – PPO | Admitting: Podiatry

## 2022-05-04 NOTE — Telephone Encounter (Signed)
LMOM informed patient to call back and schedule a follow up appointment. 3rd attempt.

## 2022-05-05 DIAGNOSIS — G4733 Obstructive sleep apnea (adult) (pediatric): Secondary | ICD-10-CM | POA: Diagnosis not present

## 2022-05-10 ENCOUNTER — Ambulatory Visit: Payer: BC Managed Care – PPO | Admitting: Podiatry

## 2022-05-16 ENCOUNTER — Ambulatory Visit (INDEPENDENT_AMBULATORY_CARE_PROVIDER_SITE_OTHER): Payer: BC Managed Care – PPO

## 2022-05-16 ENCOUNTER — Ambulatory Visit (INDEPENDENT_AMBULATORY_CARE_PROVIDER_SITE_OTHER): Payer: BC Managed Care – PPO | Admitting: Podiatry

## 2022-05-16 DIAGNOSIS — M778 Other enthesopathies, not elsewhere classified: Secondary | ICD-10-CM

## 2022-05-16 DIAGNOSIS — M62462 Contracture of muscle, left lower leg: Secondary | ICD-10-CM

## 2022-05-16 DIAGNOSIS — M7750 Other enthesopathy of unspecified foot: Secondary | ICD-10-CM

## 2022-05-16 DIAGNOSIS — M7741 Metatarsalgia, right foot: Secondary | ICD-10-CM | POA: Diagnosis not present

## 2022-05-16 DIAGNOSIS — L84 Corns and callosities: Secondary | ICD-10-CM

## 2022-05-16 DIAGNOSIS — M7752 Other enthesopathy of left foot: Secondary | ICD-10-CM | POA: Diagnosis not present

## 2022-05-16 DIAGNOSIS — M62461 Contracture of muscle, right lower leg: Secondary | ICD-10-CM | POA: Diagnosis not present

## 2022-05-16 DIAGNOSIS — G5762 Lesion of plantar nerve, left lower limb: Secondary | ICD-10-CM

## 2022-05-16 NOTE — Patient Instructions (Signed)
Look for urea 40% cream or ointment and apply to the thickened dry skin / calluses. This can be bought over the counter, at a pharmacy or online such as [employer]. Use a pumice stone daily on the callus after bathing    Do exercises exactly as told by your health care provider and adjust them as directed. It is normal to feel mild stretching, pulling, tightness, or discomfort as you do these exercises. Stop the exercise right away if you feel sudden pain or your pain gets worse.   Stretching exercises These exercises improve the movement and flexibility of your calf muscles. These exercises may also help to relieve pain and stiffness. Standing gastroc stretch  This exercise is also called a standing calf (gastroc) stretch. Stand with your hands against a wall. Extend your left / right leg behind you, and bend your front knee slightly. Your heels should be on the floor. Keeping your heels on the floor and your back knee straight, shift your weight toward the wall. You should feel a gentle stretch in the back of your lower leg (calf). Hold this position for 10 seconds. Repeat 10 times. Complete this exercise 2 times a day. Gastroc and soleus stretch, standing This is an exercise in which you stand on a step and use your body weight to stretch your calf muscles. To do this exercise: Stand with the ball of your left / right foot on a step. The ball of your foot is on the walking surface, right under your toes. Keep your other foot firmly on the same step. Hold on to the wall, a railing, or a chair for balance. Slowly lift your other foot, allowing your body weight to press your left / right heel down over the edge of the step. You should feel a stretch in your left / right calf. Hold this position for 10 seconds. Return both feet to the step. Repeat this exercise with a slight bend in your left / right knee. Repeat 10 times. Complete this exercise 2 times a day. Strengthening exercise This  exercise builds strength and endurance in your foot muscles and may help to take pressure off your heel. Endurance is the ability to use your muscles for a long time, even after they get tired. Arch lifts This exercise is sometimes called foot intrinsics. This is an exercise in which you lift the arch part of your foot only. To do this exercise: Sit in a chair with your feet flat on the floor. Keeping your big toe and your heel on the floor, lift only your arch, which is on the inner edge of your left / right foot. Do not move your knee or scrunch your toes. This is a small movement. Hold this position for 10 seconds. Return to the starting position. Repeat 10 times. Complete this exercise 2 times a day.

## 2022-05-17 ENCOUNTER — Encounter: Payer: Self-pay | Admitting: Podiatry

## 2022-05-17 NOTE — Progress Notes (Signed)
  Subjective:  Patient ID: Caitlin Moran, female    DOB: [birth year],  MRN: [id number]  Chief Complaint  Patient presents with   Foot Orthotics    Burning pain in both feet - she is interested in new orthotics (her standard poodle chewed her others) She would like to check with her insurance before she agrees to order    62 y.o. female presents with the above complaint. History confirmed with patient.  She mostly has pain in the forefoot and large callus that developed here.  She also developed right ankle pain in the front of the ankle after driving a long distance  Objective:  Physical Exam: warm, good capillary refill, no trophic changes or ulcerative lesions, normal DP and PT pulses, normal sensory exam, and bilaterally she has gastrocnemius equinus with limited dorsiflexion, prominent metatarsal heads and diffuse callus across the ball of the foot.  Mild pain to palpation of the second interspace Assessment:   1. Extensor tendinitis of foot   2. Gastrocnemius equinus of left lower extremity   3. Gastrocnemius equinus of right lower extremity   4. Metatarsalgia of both feet   5. Callus of foot   6. Morton's neuroma of left foot      Plan:  Patient was evaluated and treated and all questions answered.  .  We discussed all these issues are related to her equinus deformity and I recommended home therapy and stretching for this.  Also recommended urea cream 40% for the calluses.  She has done well with orthotic support and offloading of the metatarsal heads, she would like to check with her insurance regarding coverage for this.  She will let me know when she is ready to proceed with this.  Plan for a semirigid orthosis with increased PPT in the forefoot and metatarsal padding.  I will see her back as needed if it does not improve or worsens.  Discussed the presence of the likely neuroma as well and if this needs corticosteroid injection she will let me know.  I did believe her  extensor tendinitis on the right side was secondary to her equinus and should improve with therapy for the equinus.  Return if symptoms worsen or fail to improve.

## 2022-05-18 ENCOUNTER — Telehealth: Payer: Self-pay | Admitting: Family Medicine

## 2022-05-18 NOTE — Telephone Encounter (Signed)
Caitlin Moran, can you follow up with pt about this? Thank you

## 2022-05-18 NOTE — Telephone Encounter (Signed)
Pt called stated her insurance will not cover a sleep study. Pt is requesting a call back to discuss.

## 2022-05-22 NOTE — Telephone Encounter (Signed)
Sent patient a mychart message.

## 2022-06-03 DIAGNOSIS — G4733 Obstructive sleep apnea (adult) (pediatric): Secondary | ICD-10-CM | POA: Diagnosis not present

## 2022-06-07 ENCOUNTER — Other Ambulatory Visit: Payer: BC Managed Care – PPO

## 2022-06-13 DIAGNOSIS — M5416 Radiculopathy, lumbar region: Secondary | ICD-10-CM | POA: Diagnosis not present

## 2022-06-13 DIAGNOSIS — M48061 Spinal stenosis, lumbar region without neurogenic claudication: Secondary | ICD-10-CM | POA: Diagnosis not present

## 2022-06-13 DIAGNOSIS — M5451 Vertebrogenic low back pain: Secondary | ICD-10-CM | POA: Diagnosis not present

## 2022-06-13 DIAGNOSIS — M5126 Other intervertebral disc displacement, lumbar region: Secondary | ICD-10-CM | POA: Diagnosis not present

## 2022-06-13 DIAGNOSIS — Z79899 Other long term (current) drug therapy: Secondary | ICD-10-CM | POA: Diagnosis not present

## 2022-06-13 DIAGNOSIS — Z5181 Encounter for therapeutic drug level monitoring: Secondary | ICD-10-CM | POA: Diagnosis not present

## 2022-06-13 DIAGNOSIS — M5412 Radiculopathy, cervical region: Secondary | ICD-10-CM | POA: Diagnosis not present

## 2022-06-14 ENCOUNTER — Encounter: Payer: Self-pay | Admitting: Neurology

## 2022-06-14 ENCOUNTER — Ambulatory Visit: Payer: BC Managed Care – PPO | Admitting: Neurology

## 2022-06-14 VITALS — BP 138/73 | HR 66 | Ht 66.0 in | Wt 239.5 lb

## 2022-06-14 DIAGNOSIS — G4734 Idiopathic sleep related nonobstructive alveolar hypoventilation: Secondary | ICD-10-CM

## 2022-06-14 DIAGNOSIS — G4731 Primary central sleep apnea: Secondary | ICD-10-CM | POA: Diagnosis not present

## 2022-06-14 DIAGNOSIS — D582 Other hemoglobinopathies: Secondary | ICD-10-CM

## 2022-06-14 DIAGNOSIS — L4050 Arthropathic psoriasis, unspecified: Secondary | ICD-10-CM | POA: Insufficient documentation

## 2022-06-14 DIAGNOSIS — Z9989 Dependence on other enabling machines and devices: Secondary | ICD-10-CM | POA: Diagnosis not present

## 2022-06-14 DIAGNOSIS — D45 Polycythemia vera: Secondary | ICD-10-CM | POA: Insufficient documentation

## 2022-06-14 NOTE — Patient Instructions (Signed)

## 2022-06-14 NOTE — Progress Notes (Signed)
Provider:  Larey Seat, MD  Primary Care Physician:  Deland Pretty, MD [place] [address]     Referring Provider:   Deland Pretty, [address] [place],  [place] [address]          Chief Complaint according to patient   Patient presents with:     Sleep Patient (Initial Visit)     Patient states she here to discuss her getting another CPAP machine through her insurance.       HISTORY OF PRESENT ILLNESS:  Caitlin Moran is a 62 y.o. female patient who is here for revisit 06/14/2022 , she was last seen by Debbora Presto, NP  and having had multiple phone conversations with [name] and our sleep clinic.   " needing OSA treatment but she cannot afford a sleep study".   Chief concern according to patient :  "My insurance doesn't cover a sleep study" . She was originally re-referred by Dr. Shelia Media on 05-09-2018. PCP was concerned about rising H and H.  Dr. Shelia Media had already made a referral to hematology and the work-up has thus far not indicated as a cause of the elevated hemoglobin and hematocrit.    Caitlin Moran is a 62 y.o. female patient that had been seen here since 2013, when she was tested for OSA and following that was using CPAP but then lost coverage by insurance. Last contact was with Debbora Presto, 05-18-2022 and last visit on 03-23-2022.    [name]'s compliance report for her AutoSet CPAP is at  a daily  87% compliance which makes 26 out of 30 days, but she has trouble reaching the 4-hour mark at night.  Her compliance by hours is 65% and the average user time (on days used) is 5 hours 17 minutes.  This is an AutoSet at the manufacturer setting, which means 5 cm through 20 cm water pressure with 3 cm expiratory relief - but it leaves her with a residual AHI of 11.6/h.   It also is interesting that the 95th percentile pressure is 17 cm water-  very high.   Most of the residual respiratory events are hypopneas :  7.8/h, and  about 3/h are obstructive sleep apneas.   There is no central sleep apnea emerging , which is good.    While I do not understand that her insurance is not covering a sleep study in a patient who is obviously under served by a auto titration CPAP,  I do think we have some additional options to look at higher pressures -such as her 95th percentile at 17 cm water  to be more tolerated - and that can hopefully be achieved on  BiPAP.  Since titration in lab is indicated but not covered by insurance, we may need to use auto BIPAP titration.   So in order to have better tolerance and compliance, I would venture to try a BiPAP machine with the patient.  This would be an Auto- titration BiPAP with a setting between 10/6 through 20/16 cmH2O with the hope that she could easier tolerate and increase each night use to 4 hours or longer.          She is originally been seen upon a referral from Dr. Shelia Media on 05-09-2018. PCP is concerned about rising H and H.  Dr. Shelia Media had already made a referral to hematology and the work-up has thus far not indicated  as a cause of the elevated hemoglobin and hematocrit.  I was able to see the last lab results that actually started in May 2018 but only the hemoglobin was mildly elevated at 15.6 the hematocrit at the time was 46.3 by November the same year hemoglobin was 15.9 and hematocrit 47.4 as of November 15th 2019 the patient's hemoglobin had risen to 16.3 and her hematocrit of 48.6.  Normal MCV, normal MCH, normal white blood cell count and differential.  No elevation in alkaline phosphatase, normal liver function tests normal fasting glucose.   The patient has a known history of psoriatic arthritis, gastroesophageal reflux disease with possible irritable bowel, depression-anxiety in the past, kidney stones, headache disorder, she had been treated for it by Dr. Letta Moynahan since 2013 on Cymbalta 120 mg but her weight has gone up. She is now using  Trintellix.   Chief complaint according to patient :" I am looking for a reason/ cause for my H and H going up".     Review of Systems: Out of a complete 14 system review, the patient complains of only the following symptoms, and all other reviewed systems are negative.:  How likely are you to doze in the following situations: 0 = not likely, 1 = slight chance, 2 = moderate chance, 3 = high chance   Sitting and Reading? Watching Television? Sitting inactive in a public place (theater or meeting)? As a passenger in a car for an hour without a break? Lying down in the afternoon when circumstances permit? Sitting and talking to someone? Sitting quietly after lunch without alcohol? In a car, while stopped for a few minutes in traffic?   Total = 5/ 24 points   FSS endorsed at 29/ 63 points.     Social History   Socioeconomic History   Marital status: Married    Spouse name: Not on file   Number of children: 2   Years of education: Not on file   Highest education level: Not on file  Occupational History   Occupation: RN  Tobacco Use   Smoking status: Former    Types: Cigarettes    Quit date: 11/05/1990    Years since quitting: 31.6    Passive exposure: Past   Smokeless tobacco: Never  Vaping Use   Vaping Use: Never used  Substance and Sexual Activity   Alcohol use: Yes    Alcohol/week: 7.0 standard drinks of alcohol    Types: 7 Glasses of wine per week    Comment: 5   Drug use: No   Sexual activity: Not on file  Other Topics Concern   Not on file  Social History Narrative   Not on file   Social Determinants of Health   Financial Resource Strain: Not on file  Food Insecurity: Not on file  Transportation Needs: Not on file  Physical Activity: Not on file  Stress: Not on file  Social Connections: Not on file    Family History  Problem Relation Age of Onset   Breast cancer Maternal Aunt    Esophageal cancer Mother    Lung cancer Father    CAD Father     Colon cancer Neg Hx    Inflammatory bowel disease Neg Hx    Liver disease Neg Hx    Pancreatic cancer Neg Hx    Stomach cancer Neg Hx    Rectal cancer Neg Hx     Past Medical History:  Diagnosis Date   Anxiety    Arthritis  neck   Depression    GERD (gastroesophageal reflux disease)    Headache    History of kidney stones    kidney stone -lithrotrispy 04/30/18- 2 current - stable   IBS (irritable bowel syndrome)    OSA (obstructive sleep apnea)    on CPAP   Polycythemia vera (HCC)    PONV (postoperative nausea and vomiting)    Didn't go to sleep all the way  during colonoscopy.   Renal calculus, right    Right ureteral stone    Spinal stenosis     Past Surgical History:  Procedure Laterality Date   ABLATION     unquestionable   BIOPSY  05/01/2018   Procedure: BIOPSY;  Surgeon: Rush Landmark Telford Nab., MD;  Location: [facility];  Service: Gastroenterology;;   BIOPSY  05/24/2020   Procedure: BIOPSY;  Surgeon: Irving Copas., MD;  Location: [facility];  Service: Gastroenterology;;   CESAREAN SECTION     CHOLECYSTECTOMY     COLONOSCOPY     COLONOSCOPY WITH PROPOFOL N/A 05/24/2020   Procedure: COLONOSCOPY WITH PROPOFOL;  Surgeon: Irving Copas., MD;  Location: [facility];  Service: Gastroenterology;  Laterality: N/A;   ESOPHAGOGASTRODUODENOSCOPY     ESOPHAGOGASTRODUODENOSCOPY N/A 05/01/2018   Procedure: ESOPHAGOGASTRODUODENOSCOPY (EGD);  Surgeon: Irving Copas., MD;  Location: [facility];  Service: Gastroenterology;  Laterality: N/A;   ESOPHAGOGASTRODUODENOSCOPY (EGD) WITH PROPOFOL N/A 05/24/2020   Procedure: ESOPHAGOGASTRODUODENOSCOPY (EGD) WITH PROPOFOL;  Surgeon: Rush Landmark Telford Nab., MD;  Location: [facility];  Service: Gastroenterology;  Laterality: N/A;   EUS N/A 05/01/2018   Procedure: UPPER ENDOSCOPIC ULTRASOUND (EUS) RADIAL;  Surgeon: Irving Copas., MD;  Location: [facility];  Service: Gastroenterology;  Laterality:  N/A;   EXTRACORPOREAL SHOCK WAVE LITHOTRIPSY  02/14/2010   EXTRACORPOREAL SHOCK WAVE LITHOTRIPSY Right 03/30/2022   Procedure: EXTRACORPOREAL SHOCK WAVE LITHOTRIPSY (ESWL);  Surgeon: Abbie Sons, MD;  Location: [facility];  Service: Urology;  Laterality: Right;   HYSTEROSCOPY WITH D & C N/A 11/27/2012   Procedure: DILATATION AND CURETTAGE /HYSTEROSCOPY;  Surgeon: Marylynn Pearson, MD;  Location: [facility];  Service: Gynecology;  Laterality: N/A;   SUBMUCOSAL LIFTING INJECTION  05/24/2020   Procedure: SUBMUCOSAL LIFTING INJECTION;  Surgeon: Rush Landmark Telford Nab., MD;  Location: [facility];  Service: Gastroenterology;;   WISDOM TOOTH EXTRACTION       Current Outpatient Medications on File Prior to Visit  Medication Sig Dispense Refill   aspirin EC 81 MG tablet Take 81 mg by mouth daily.     Calcium Carb-Cholecalciferol (CALCIUM-VITAMIN D) 600-400 MG-UNIT TABS Take 1 tablet by mouth daily.     celecoxib (CELEBREX) 200 MG capsule Take 200 mg by mouth daily.      Cholecalciferol (VITAMIN D) 50 MCG (2000 UT) CAPS Take 2,000 Units by mouth daily.      clobetasol cream (TEMOVATE) [id number] % Apply 1 application topically daily as needed (for psoriasis).      fish oil-omega-3 fatty acids 1000 MG capsule Take 1 g by mouth daily.      Golimumab 50 MG/0.5ML SOAJ Inject 50 mg into the skin every 30 (thirty) days.      HYDROcodone-acetaminophen (NORCO) 7.5-325 MG tablet Take 1 tablet by mouth every 6 (six) hours as needed for moderate pain.  0   levofloxacin (LEVAQUIN) 750 MG tablet Take 1 tablet (750 mg total) by mouth daily. 5 tablet 0   methocarbamol (ROBAXIN) 500 MG tablet TAKE 1 TABLET BY MOUTH EVERY 8 HOURS AS NEEDED FOR SPASM  omeprazole (PRILOSEC) 40 MG capsule Take 1 capsule (40 mg total) by mouth 2 (two) times daily before a meal. 60 capsule 0   ondansetron (ZOFRAN-ODT) 4 MG disintegrating tablet Take 1 tablet (4 mg total) by mouth every 8 (eight) hours as needed for nausea or vomiting. 20 tablet 0    predniSONE (DELTASONE) 5 MG tablet Take 10 mg by mouth 2 (two) times daily.     Probiotic Product (PROBIOTIC PO) Take 1 capsule by mouth daily.     rosuvastatin (CRESTOR) 5 MG tablet Take 5 mg by mouth daily.     tamsulosin (FLOMAX) 0.4 MG CAPS capsule Take 1 capsule (0.4 mg total) by mouth daily. 30 capsule 3   tiZANidine (ZANAFLEX) 2 MG tablet Take 2 mg by mouth 3 (three) times daily as needed.     Turmeric Curcumin 500 MG CAPS See admin instructions.     vortioxetine HBr (TRINTELLIX) 20 MG TABS tablet Take 20 mg by mouth daily.     No current facility-administered medications on file prior to visit.    No Known Allergies   DIAGNOSTIC DATA (LABS, IMAGING, TESTING) - I reviewed patient records, labs, notes, testing and imaging myself where available.    10/21/2018 [facility] REFERRING M.D.:                 Deland Pretty, MD Study Performed:   Titration to positive airway pressure. HISTORY:  Mrs. Cryan returns for a CPAP Titration on 10-21-2018 following a Baseline PSG from 08/19/18. Patient had an AHI of 37.7/h, Supine AHI of 114.9/h, and total desaturation time of 141 minutes, with an oxygen nadir of 79%. 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    The patient endorsed the Epworth Sleepiness Scale at 7/24 points.   The patient's weight 239 pounds with a height of 67 (inches), resulting in a BMI of 37.4 kg/m2. The patient's neck circumference measured 16.3 inches.   CURRENT MEDICATIONS: Aspirin, Celebrex, Vitamin D, Temovate, Klonopin, Omega-3, Simponi, Norco, Multivitamin, Prilosec, Crestor, Trintellix.   PROCEDURE:  This is a multichannel digital polysomnogram utilizing the SomnoStar 11.2 system.  Electrodes and sensors were applied and monitored per AASM Specifications.   EEG, EOG, Chin and Limb EMG, were  sampled at 200 Hz.  ECG, Snore and Nasal Pressure, Thermal Airflow, Respiratory Effort, CPAP Flow and Pressure, Oximetry was sampled at 50 Hz. Digital video and audio were recorded.       CPAP was initiated at 5 cmH20 with a Fisher Paykel FFM Simplus, with heated humidity per AASM split night standards and pressure was advanced to 14 cmH20 because of hypopneas, apneas and desaturations.   At a PAP pressure of 14 cmH20, there was a reduction of the AHI to 1.1/h with improvement of sleep apnea. Lights Out was at 21:36 and Lights On at 05:18. Total recording time (TRT) was 462 minutes, with a total sleep time (TST) of 438.5 minutes. The patient's sleep latency was 8 minutes. REM latency was 98 minutes.  The sleep efficiency was 94.9 %.     SLEEP ARCHITECTURE: WASO (Wake after sleep onset) was 18.5 minutes.  There was 1 minute in Stage N1, 88.5 minutes Stage N2, 174.5 minutes Stage N3 and 174.5 minutes in Stage REM.  The percentage of Stage N1 was 0.2%, Stage N2 was 20.2%, Stage N3 was 39.8% and Stage  R (REM sleep) was 39.8%.    RESPIRATORY ANALYSIS:  There was a total of 99 respiratory events: 10 obstructive apneas. There were 89 hypopneas with a hypopnea index of 12.2/hour.  The total APNEA/HYPOPNEA INDEX  (AHI) was 13.5 /hour. 8 events occurred in REM sleep and 91 events in NREM.  The REM AHI was 2.8 /hour versus a non-REM AHI of 20.7 /hour.   The patient spent 266.5 minutes of total sleep time in the supine position and 172 minutes in non-supine.  The supine AHI was 20.0/h, versus a non-supine AHI of 3.5/h.   OXYGEN SATURATION & C02:  The baseline 02 saturation was 92%, with the lowest being 80%. Time spent below 89% saturation equaled 63 minutes.   The arousals were noted as: 32 were spontaneous, 31 were associated with PLMs, and 57 were associated with respiratory events. The patient had a total of 135 Periodic Limb Movements. The Periodic Limb Movement (PLM) index was 18.5 and the PLM Arousal  index was 4.2 /hour. Audio and Video analysis did not show any abnormal or unusual movements, behaviors, phonations or vocalizations.   Loud Snoring was noted until PAP-pressures reached 10 cm water and  was positional dependent (supine).  EKG was in keeping with normal sinus rhythm (NSR). Post-study, the patient indicated that sleep quality had improved under CPAP.      DIAGNOSIS Primary Snoring, severe Obstructive Sleep Apnea and severe Sleep Related Hypoxemia improved under CPAP at 14 cm water. The elevated hematocrit /hemoglobin is likely related to hypoxemia.    PLANS/RECOMMENDATIONS: The patient was fitted with a Fisher and Paykel Simplus FF mask in medium. I will prescribe CPAP autotitration device with heated humidity and a pressure window from 6-16 cm water with 2 cm EPR.    Lab Results  Component Value Date   WBC 7.4 05/17/2020   HGB 15.4 (H) 05/17/2020   HCT 45.1 05/17/2020   MCV 89.2 05/17/2020   PLT 252.0 05/17/2020      Component Value Date/Time   NA 141 05/17/2020 1432   K 4.2 05/17/2020 1432   CL 104 05/17/2020 1432   CO2 31 05/17/2020 1432   GLUCOSE 86 05/17/2020 1432   BUN 21 05/17/2020 1432   CREATININE 1.01 05/17/2020 1432   CALCIUM 9.7 05/17/2020 1432   PROT 6.8 05/17/2020 1432   ALBUMIN 4.0 05/17/2020 1432   AST 15 05/17/2020 1432   ALT 18 05/17/2020 1432   ALKPHOS 80 05/17/2020 1432   BILITOT 0.8 05/17/2020 1432   GFRNONAA >60 10/16/2010 1600   GFRAA >60 10/16/2010 1600   No results found for: "CHOL", "HDL", "LDLCALC", "LDLDIRECT", "TRIG", "CHOLHDL" No results found for: "HGBA1C" No results found for: "VITAMINB12" No results found for: "TSH"  PHYSICAL EXAM:  There were no vitals filed for this visit. There is no height or weight on file to calculate BMI.   Wt Readings from Last 3 Encounters:  04/20/22 200 lb (90.7 kg)  03/30/22 215 lb (97.5 kg)  03/23/22 215 lb (97.5 kg)     Ht Readings from Last 3 Encounters:  04/20/22 5\' 7"  (1.702 m)   03/30/22 5\' 6"  (1.676 m)  03/23/22 5\' 6"  (1.676 m)      General: The patient is awake, alert and appears not in acute distress. The patient is well groomed.  Head: Normocephalic, atraumatic. Neck is supple. Mallampati  2- left tongue is higher-  neck circumference:16.30 ". Nasal airflow congested . Retrognathia is seen.  Cardiovascular:  Regular rate and rhythm ,  without murmurs or carotid bruit, and without distended neck veins. Respiratory: Lungs are clear to auscultation. Skin:  Without evidence of edema, or rash Trunk: BMI is 38.66 kg/m2 . Neurologic exam : The patient is awake and alert, oriented to place and time.   Memory subjective  described as intact.   Attention span & concentration ability appears normal.  Speech is fluent,  without  dysarthria, with mild dysphonia .  Mood and affect are appropriate.   Cranial nerves: Pupils are equal and briskly reactive to light. Extraocular movements  in vertical and horizontal planes intact and without nystagmus. Visual fields by finger perimetry are intact. Hearing to finger rub intact.  Facial sensation intact to fine touch.  Facial motor strength is symmetric and tongue and uvula move midline. Shoulder shrug was symmetrical.    Motor exam:  Normal tone, muscle bulk and symmetric strength in all extremities.   Sensory:  Fine touch, pinprick and vibration were tested in all extremities. Proprioception tested in the upper extremities was normal.   Coordination:  No changes in penmanship .Finger-to-nose maneuver normal without evidence of ataxia, dysmetria or tremor.   Gait and station: Patient walks without assistive device.Strength within normal limits.  Stance is stable and normal based .  DEep tendon reflexes: in the upper and lower extremities are symmetric and intact. Babinski maneuver response is  downgoing.    ASSESSMENT AND PLAN 62 y.o. year old female  here with:    1) erythrocytosis and high H and H in the setting of  partially treated Complex SA. Patient has trouble tolerating the PAP therapy has a high 96 5% pressure of 17 cm water and some nights has air-leaks and swallows air. Her H and H has improved since PAP therapy.  2) she is using FFM, as was told she is a mouth breather. - however , a chinstrap or nasal interface had not been tested.   While I do not understand that her insurance is not covering a sleep study in a patient who is obviously under served by a auto titration CPAP,  I do think we have some additional options to look at higher pressures -such as her 95th percentile at 17 cm water  to be more tolerated - and that can hopefully be achieved on  BiPAP.  Since titration in lab is indicated but not covered by insurance, we may need to use auto BIPAP titration.   So in order to have better tolerance and compliance, I would venture to try a BiPAP machine with the patient.  This would be an Auto- titration BiPAP with a setting between 10/6 through 20/16 cmH2O with the hope that she could easier tolerate and increase each night use to 4 hours or longer. In order to improve her therapy on PAP we will need to switch to a BIPAP for tolerance. We will start with auto BiPAP from 10/ 6 cm water through 20/ 16 cm water.    I hope to reduce the current high residual AHI from 11.6 to under 3.  This is the long-term goal for this patient.  She also is a hypoventilatory check and had significant hypoxia, she does not have a straightforward obstructive sleep apnea but has some central component to her apnea and her BMI is 38.5.  She is not a candidate for inspire or dental device therapy.   I plan to follow up either personally or through our NP within 4 months.   I would like to thank Shelia Media,  Thayer Jew, MD and Deland Pretty, [place] [address] [place],  [place] [address] for allowing me to meet with and to take care of this pleasant patient.   CC: I will share my notes with PCP Dr Shelia Media and Debbora Presto,  NP/ .  After spending a total time of    minutes face to face and additional time for physical and neurologic examination, review of laboratory studies,  personal review of imaging studies, reports and results of other testing and review of referral information / records as far as provided in visit,   Electronically signed by: Larey Seat, MD 06/14/2022 12:59 PM  [facility] Board certified by The [employer] of Sleep Medicine and Diplomate of the [employer] of Sleep Medicine. Board certified In Neurology through the [place], Fellow of the [employer] of Neurology. Medical Director of [facility].

## 2022-06-15 ENCOUNTER — Other Ambulatory Visit: Payer: Self-pay | Admitting: Neurology

## 2022-06-15 DIAGNOSIS — Z9989 Dependence on other enabling machines and devices: Secondary | ICD-10-CM

## 2022-06-15 DIAGNOSIS — G4731 Primary central sleep apnea: Secondary | ICD-10-CM

## 2022-06-15 DIAGNOSIS — G4734 Idiopathic sleep related nonobstructive alveolar hypoventilation: Secondary | ICD-10-CM

## 2022-06-20 DIAGNOSIS — L720 Epidermal cyst: Secondary | ICD-10-CM | POA: Diagnosis not present

## 2022-06-20 DIAGNOSIS — L820 Inflamed seborrheic keratosis: Secondary | ICD-10-CM | POA: Diagnosis not present

## 2022-07-04 DIAGNOSIS — G4733 Obstructive sleep apnea (adult) (pediatric): Secondary | ICD-10-CM | POA: Diagnosis not present

## 2022-07-05 ENCOUNTER — Telehealth: Payer: Self-pay | Admitting: Neurology

## 2022-07-05 NOTE — Telephone Encounter (Signed)
4/24:LVM-MLA 06/28/22 BCBS no auth req spoke to [facility] ref # [id number] EE

## 2022-07-06 ENCOUNTER — Telehealth: Payer: Self-pay | Admitting: Neurology

## 2022-07-06 NOTE — Telephone Encounter (Signed)
error 

## 2022-07-10 NOTE — Addendum Note (Signed)
Addended byLilian Kapur, Kotaro Buer R on: 07/10/2022 03:20 PM   Modules accepted: Orders

## 2022-07-13 DIAGNOSIS — M5136 Other intervertebral disc degeneration, lumbar region: Secondary | ICD-10-CM | POA: Diagnosis not present

## 2022-07-13 DIAGNOSIS — M5451 Vertebrogenic low back pain: Secondary | ICD-10-CM | POA: Diagnosis not present

## 2022-07-21 DIAGNOSIS — G4733 Obstructive sleep apnea (adult) (pediatric): Secondary | ICD-10-CM | POA: Diagnosis not present

## 2022-07-24 ENCOUNTER — Telehealth: Payer: Self-pay | Admitting: Neurology

## 2022-07-24 NOTE — Telephone Encounter (Addendum)
Called and LVM for pt to call back and schedule Initial Cpap appt. Pt is needing to be scheduled between the dates of 08/22/22-10/21/22 with Dr. Vickey Huger or NP Amy Nicholas Lose.

## 2022-07-25 NOTE — Telephone Encounter (Signed)
Called and LVM for pt to call back and schedule Initial Cpap appt.

## 2022-08-01 DIAGNOSIS — Z6836 Body mass index (BMI) 36.0-36.9, adult: Secondary | ICD-10-CM | POA: Diagnosis not present

## 2022-08-01 DIAGNOSIS — Z1231 Encounter for screening mammogram for malignant neoplasm of breast: Secondary | ICD-10-CM | POA: Diagnosis not present

## 2022-08-01 DIAGNOSIS — Z01419 Encounter for gynecological examination (general) (routine) without abnormal findings: Secondary | ICD-10-CM | POA: Diagnosis not present

## 2022-08-03 ENCOUNTER — Ambulatory Visit
Admission: RE | Admit: 2022-08-03 | Discharge: 2022-08-03 | Disposition: A | Discharge status: Home / Self Care | Payer: BC Managed Care – PPO | Source: Ambulatory Visit | Attending: Physician Assistant | Admitting: Physician Assistant

## 2022-08-03 ENCOUNTER — Other Ambulatory Visit: Payer: Self-pay

## 2022-08-03 ENCOUNTER — Ambulatory Visit
Admission: RE | Admit: 2022-08-03 | Discharge: 2022-08-03 | Disposition: A | Discharge status: Home / Self Care | Payer: BC Managed Care – PPO | Attending: Physician Assistant | Admitting: Physician Assistant

## 2022-08-03 ENCOUNTER — Ambulatory Visit: Payer: BC Managed Care – PPO | Admitting: Physician Assistant

## 2022-08-03 VITALS — BP 121/77 | HR 66 | Ht 66.5 in | Wt 229.0 lb

## 2022-08-03 DIAGNOSIS — N200 Calculus of kidney: Secondary | ICD-10-CM

## 2022-08-03 LAB — URINALYSIS, COMPLETE
Bilirubin, UA: NEGATIVE
Glucose, UA: NEGATIVE
Ketones, UA: NEGATIVE
Leukocytes,UA: NEGATIVE
Nitrite, UA: NEGATIVE
Protein,UA: NEGATIVE
Specific Gravity, UA: 1.015 (ref 1.005–1.030)
Urobilinogen, Ur: 1 mg/dL (ref 0.2–1.0)
pH, UA: 6.5 (ref 5.0–7.5)

## 2022-08-03 LAB — MICROSCOPIC EXAMINATION

## 2022-08-03 NOTE — Progress Notes (Signed)
08/03/2022 11:49 AM   Caitlin Moran 1960/11/04 [id number]  CC: Chief Complaint  Patient presents with   Nephrolithiasis   HPI: Caitlin Moran is a 62 y.o. female with PMH microscopic hematuria with benign workup in 2023 and nephrolithiasis who presents today for evaluation of possible kidney stone.   I saw her in clinic most recently on 04/20/2022 for follow-up s/p ESWL of a proximal right ureteral stone.  KUB showed interval migration of one of her right lower pole renal stones into the proximal right ureter.  I recommended consideration of ESWL or URS.  Today she reports her gross hematuria and discomfort resolved, so she assumed that she passed the proximal right ureteral stone.  2 weeks ago, she had return of painless gross hematuria followed by severe right flank pain and vomiting 4 nights ago, which was manageable with medications.  Her symptoms are very mild now, but still present.  KUB today with a stable 6 mm proximal right ureteral stone and interval medial migration of her right lower pole stone, though this appears to still be within the renal pelvis.  Notably, there is another radiopaque density within the renal shadow which may represent a third right lower pole stone.  In-office UA today positive for trace intact blood; urine microscopy with 3-10 RBCs/HPF.  PMH: Past Medical History:  Diagnosis Date   Anxiety    Arthritis    neck   Depression    GERD (gastroesophageal reflux disease)    Headache    History of kidney stones    kidney stone -lithrotrispy 04/30/18- 2 current - stable   IBS (irritable bowel syndrome)    OSA (obstructive sleep apnea)    on CPAP   Polycythemia vera (HCC)    PONV (postoperative nausea and vomiting)    Didn't go to sleep all the way  during colonoscopy.   Renal calculus, right    Right ureteral stone    Spinal stenosis     Surgical History: Past Surgical History:  Procedure Laterality Date   ABLATION     unquestionable    BIOPSY  05/01/2018   Procedure: BIOPSY;  Surgeon: Meridee Score Netty Starring., MD;  Location: [facility] ENDOSCOPY;  Service: Gastroenterology;;   BIOPSY  05/24/2020   Procedure: BIOPSY;  Surgeon: Lemar Lofty., MD;  Location: [facility] ENDOSCOPY;  Service: Gastroenterology;;   CESAREAN SECTION     CHOLECYSTECTOMY     COLONOSCOPY     COLONOSCOPY WITH PROPOFOL N/A 05/24/2020   Procedure: COLONOSCOPY WITH PROPOFOL;  Surgeon: Lemar Lofty., MD;  Location: [facility] ENDOSCOPY;  Service: Gastroenterology;  Laterality: N/A;   ESOPHAGOGASTRODUODENOSCOPY     ESOPHAGOGASTRODUODENOSCOPY N/A 05/01/2018   Procedure: ESOPHAGOGASTRODUODENOSCOPY (EGD);  Surgeon: Lemar Lofty., MD;  Location: [facility] ENDOSCOPY;  Service: Gastroenterology;  Laterality: N/A;   ESOPHAGOGASTRODUODENOSCOPY (EGD) WITH PROPOFOL N/A 05/24/2020   Procedure: ESOPHAGOGASTRODUODENOSCOPY (EGD) WITH PROPOFOL;  Surgeon: Meridee Score Netty Starring., MD;  Location: [facility] ENDOSCOPY;  Service: Gastroenterology;  Laterality: N/A;   EUS N/A 05/01/2018   Procedure: UPPER ENDOSCOPIC ULTRASOUND (EUS) RADIAL;  Surgeon: Lemar Lofty., MD;  Location: [facility] ENDOSCOPY;  Service: Gastroenterology;  Laterality: N/A;   EXTRACORPOREAL SHOCK WAVE LITHOTRIPSY  02/14/2010   EXTRACORPOREAL SHOCK WAVE LITHOTRIPSY Right 03/30/2022   Procedure: EXTRACORPOREAL SHOCK WAVE LITHOTRIPSY (ESWL);  Surgeon: Riki Altes, MD;  Location: [facility] ORS;  Service: Urology;  Laterality: Right;   HYSTEROSCOPY WITH D & C N/A 11/27/2012   Procedure: DILATATION AND CURETTAGE /HYSTEROSCOPY;  Surgeon: Zelphia Cairo, MD;  Location: [facility] ORS;  Service: Gynecology;  Laterality: N/A;   SUBMUCOSAL LIFTING INJECTION  05/24/2020   Procedure: SUBMUCOSAL LIFTING INJECTION;  Surgeon: Meridee Score Netty Starring., MD;  Location: [facility] ENDOSCOPY;  Service: Gastroenterology;;   WISDOM TOOTH EXTRACTION      Home Medications:  Allergies as of 08/03/2022   No Known Allergies      Medication List         Accurate as of Aug 03, 2022 11:49 AM. If you have any questions, ask your nurse or doctor.          STOP taking these medications    Cymbalta 60 MG capsule Generic drug: DULoxetine Stopped by: Carman Ching, PA-C       TAKE these medications    aspirin EC 81 MG tablet Take 81 mg by mouth daily.   Calcium-Vitamin D 600-400 MG-UNIT Tabs Take 1 tablet by mouth daily.   celecoxib 200 MG capsule Commonly known as: CELEBREX Take 200 mg by mouth daily.   clobetasol cream 0.05 % Commonly known as: TEMOVATE Apply 1 application topically daily as needed (for psoriasis).   fish oil-omega-3 fatty acids 1000 MG capsule Take 1 g by mouth daily.   Golimumab 50 MG/0.5ML Soaj Inject 50 mg into the skin every 30 (thirty) days.   HYDROcodone-acetaminophen 7.5-325 MG tablet Commonly known as: NORCO Take 1 tablet by mouth every 6 (six) hours as needed for moderate pain.   methocarbamol 500 MG tablet Commonly known as: ROBAXIN TAKE 1 TABLET BY MOUTH EVERY 8 HOURS AS NEEDED FOR SPASM   omeprazole 40 MG capsule Commonly known as: PRILOSEC Take 1 capsule (40 mg total) by mouth 2 (two) times daily before a meal.   ondansetron 4 MG disintegrating tablet Commonly known as: ZOFRAN-ODT Take 1 tablet (4 mg total) by mouth every 8 (eight) hours as needed for nausea or vomiting.   PROBIOTIC PO Take 1 capsule by mouth daily.   rosuvastatin 5 MG tablet Commonly known as: CRESTOR Take 5 mg by mouth daily.   tamsulosin 0.4 MG Caps capsule Commonly known as: FLOMAX Take 1 capsule (0.4 mg total) by mouth daily.   tiZANidine 2 MG tablet Commonly known as: ZANAFLEX Take 2 mg by mouth 3 (three) times daily as needed.   Turmeric Curcumin 500 MG Caps See admin instructions.   Vitamin D 50 MCG (2000 UT) Caps Take 2,000 Units by mouth daily.   vortioxetine HBr 20 MG Tabs tablet Commonly known as: TRINTELLIX Take 20 mg by mouth daily.        Allergies:  No Known  Allergies  Family History: Family History  Problem Relation Age of Onset   Breast cancer Maternal Aunt    Esophageal cancer Mother    Lung cancer Father    CAD Father    Colon cancer Neg Hx    Inflammatory bowel disease Neg Hx    Liver disease Neg Hx    Pancreatic cancer Neg Hx    Stomach cancer Neg Hx    Rectal cancer Neg Hx     Social History:   reports that she quit smoking about 31 years ago. Her smoking use included cigarettes. She has been exposed to tobacco smoke. She has never used smokeless tobacco. She reports current alcohol use of about 7.0 standard drinks of alcohol per week. She reports that she does not use drugs.  Physical Exam: BP 121/77   Pulse 66   Ht 5' 6.5" (1.689 m)   Wt 229 lb (103.9 kg)  LMP 09/24/2012   BMI 36.41 kg/m   Constitutional:  Alert and oriented, no acute distress, nontoxic appearing HEENT: [place], AT Cardiovascular: No clubbing, cyanosis, or edema Respiratory: Normal respiratory effort, no increased work of breathing Skin: No rashes, bruises or suspicious lesions Neurologic: Grossly intact, no focal deficits, moving all 4 extremities Psychiatric: Normal mood and affect  Laboratory Data: Results for orders placed or performed in visit on 08/03/22  Microscopic Examination   Urine  Result Value Ref Range   WBC, UA 0-5 0 - 5 /hpf   RBC, Urine 3-10 (A) 0 - 2 /hpf   Epithelial Cells (non renal) 0-10 0 - 10 /hpf   Casts Present (A) None seen /lpf   Cast Type Hyaline casts N/A   Mucus, UA Present (A) Not Estab.   Bacteria, UA Few None seen/Few  Urinalysis, Complete  Result Value Ref Range   Specific Gravity, UA 1.015 1.005 - 1.030   pH, UA 6.5 5.0 - 7.5   Color, UA Yellow Yellow   Appearance Ur Clear Clear   Leukocytes,UA Negative Negative   Protein,UA Negative Negative/Trace   Glucose, UA Negative Negative   Ketones, UA Negative Negative   RBC, UA Trace (A) Negative   Bilirubin, UA Negative Negative   Urobilinogen, Ur 1.0 0.2 - 1.0  mg/dL   Nitrite, UA Negative Negative   Microscopic Examination See below:    Pertinent Imaging: KUB, 08/03/2022: CLINICAL DATA:  History of kidney stone   EXAM: ABDOMEN - 1 VIEW   COMPARISON:  April 20, 2022   FINDINGS: The left kidney is significantly obscured by bowel contents. No left-sided stones noted.   The right kidney is partially obscured by bowel contents. There is a stone or stones in the lower pole of the right kidney measuring 8 mm. More centrally, in the region of the right renal pelvis, is a rounded density measuring 6.6 mm which could represent bowel contents or a stone in the region of the renal pelvis.   No ureteral stones are noted.   IMPRESSION: 1. The left kidney is significantly obscured by bowel contents. No left-sided stones identified. 2. The right kidney is partially obscured by bowel contents. There is a stone or stones in the lower pole of the right kidney measuring 8 mm. More centrally, in the region of the right renal pelvis, is a rounded density measuring 6.6 mm which could represent bowel contents or a stone in the region of the renal pelvis. 3. No ureteral stones identified.     Electronically Signed   By: Gerome Sam III M.D.   On: 08/07/2022 11:38  I personally reviewed the images referenced above and note a stable 6 mm proximal right ureteral stone, interval migration of her right lower pole stone medially into the right renal pelvis, and a possible residual right lower pole stone.  Assessment & Plan:   1. Kidney stones Stable 6 mm proximal right ureteral stone, interval migration of the right lower pole stone medially into the right renal pelvis, and a possible third right lower pole stone.  She is comfortable and UA is notable only for microscopic hematuria, low suspicion for UTI at this time.  We discussed various treatment options for her stones including trial of passage vs. ESWL vs. ureteroscopy with laser lithotripsy and  stent. We discussed the risks and benefits of all procedures including bleeding, infection, and damage to surrounding structures.  We specifically discussed that ESWL is a less invasive procedure that requires less anesthesia,  however patients have to pass their residual stone fragments, which may take several weeks and be associated with continued renal colic. Additionally, we discussed the limitations of ESWL including the ability to only treat one stone in a single treatment (in this case, her proximal right ureteral stone) and the low, 10-20% chance of treatment failure requiring repeat ESWL versus ureteroscopy in the future. By comparison, ureteroscopy is a more invasive surgical procedure that requires more anesthesia, but we can potentially treat multiple stones in a single procedure. However, URS does require placement of a ureteral stent, which will remain in place for approximately 3-10 days and can be associated with flank pain, bladder pain, dysuria, urgency, frequency, urinary leakage, and gross hematuria.  We specifically discussed her frequency of stone episodes and that ureteroscopy would have the advantage of being able to clear her right-sided stone burden.  Based on this conversation, she is leaning toward ureteroscopy, but would like to contact her insurance company to discuss relative coverage/cost of ESWL versus URS.  She already has Zofran and pain medication at home for symptom control.  She will contact me when she decides how she would like to proceed.  We discussed return precautions including fever, uncontrolled pain, and uncontrolled nausea/vomiting. - Urinalysis, Complete - CULTURE, URINE COMPREHENSIVE   Return for Patient to call to schedule desired procedure.  Carman Ching, PA-C  [facility] [place] [address] [place], [place] [address] [phone]

## 2022-08-03 NOTE — Patient Instructions (Addendum)
Call your insurance company to ask about the relative costs/coverage for another shockwave lithotripsy (ESWL) versus a right ureteroscopy. We can only treat one stone with ESWL, but can treat all the stones on the right side with ureteroscopy. Call me in clinic or send me a MyChart message with how you would like to proceed.  In the meantime, please do the following: -Treat any pain with the pain medication you have on hand -Treat any nausea with Zofran  Please call our office immediately (we are open 8a-5p Monday-Friday) or go to the Emergency Department if you develop any of the following: -Fever/chills -Nausea and/or vomiting uncontrollable with Zofran -Pain uncontrollable with pain medication

## 2022-08-08 LAB — CULTURE, URINE COMPREHENSIVE

## 2022-08-09 ENCOUNTER — Other Ambulatory Visit: Payer: BC Managed Care – PPO

## 2022-08-09 DIAGNOSIS — M7741 Metatarsalgia, right foot: Secondary | ICD-10-CM

## 2022-08-09 DIAGNOSIS — G5762 Lesion of plantar nerve, left lower limb: Secondary | ICD-10-CM

## 2022-08-09 DIAGNOSIS — M778 Other enthesopathies, not elsewhere classified: Secondary | ICD-10-CM

## 2022-08-11 ENCOUNTER — Telehealth: Payer: Self-pay | Admitting: *Deleted

## 2022-08-11 NOTE — Telephone Encounter (Signed)
Spoke with pt. Pt. Given CPT codes and Financial Information. Verbalized understanding. She will touch base with insurance. States that she has already met her deductible.

## 2022-08-11 NOTE — Telephone Encounter (Signed)
Pt returned call and would like CPT codes to check with her insurance about ureteroscopy. I advised pt that Melissa our surgery scheduler would check all those things for her. Please advise

## 2022-08-21 DIAGNOSIS — G4733 Obstructive sleep apnea (adult) (pediatric): Secondary | ICD-10-CM | POA: Diagnosis not present

## 2022-09-07 DIAGNOSIS — Z01818 Encounter for other preprocedural examination: Secondary | ICD-10-CM | POA: Diagnosis not present

## 2022-09-07 DIAGNOSIS — Z6836 Body mass index (BMI) 36.0-36.9, adult: Secondary | ICD-10-CM | POA: Diagnosis not present

## 2022-09-20 DIAGNOSIS — G4733 Obstructive sleep apnea (adult) (pediatric): Secondary | ICD-10-CM | POA: Diagnosis not present

## 2022-10-12 ENCOUNTER — Ambulatory Visit: Payer: BC Managed Care – PPO | Admitting: Neurology

## 2022-10-12 ENCOUNTER — Telehealth: Payer: Self-pay | Admitting: Neurology

## 2022-10-12 ENCOUNTER — Encounter: Payer: Self-pay | Admitting: Neurology

## 2022-10-12 DIAGNOSIS — Z0289 Encounter for other administrative examinations: Secondary | ICD-10-CM

## 2022-10-12 NOTE — Telephone Encounter (Signed)
..   Pt understands that although there may be some limitations with this type of visit, we will take all precautions to reduce any security or privacy concerns.  Pt understands that this will be treated like an in office visit and we will file with pt's insurance, and there may be a patient responsible charge related to this service. ? ?

## 2022-10-16 ENCOUNTER — Telehealth: Payer: BC Managed Care – PPO | Admitting: Neurology

## 2022-10-16 DIAGNOSIS — G4730 Sleep apnea, unspecified: Secondary | ICD-10-CM | POA: Diagnosis not present

## 2022-10-16 DIAGNOSIS — M5030 Other cervical disc degeneration, unspecified cervical region: Secondary | ICD-10-CM | POA: Diagnosis not present

## 2022-10-16 DIAGNOSIS — M5416 Radiculopathy, lumbar region: Secondary | ICD-10-CM | POA: Diagnosis not present

## 2022-10-16 DIAGNOSIS — M5412 Radiculopathy, cervical region: Secondary | ICD-10-CM | POA: Diagnosis not present

## 2022-10-16 DIAGNOSIS — M5126 Other intervertebral disc displacement, lumbar region: Secondary | ICD-10-CM | POA: Diagnosis not present

## 2022-10-16 NOTE — Progress Notes (Signed)
Virtual Visit via Video Note  I connected with Caitlin Moran on 10/16/22 at 10:00 AM EDT by a video enabled telemedicine application and verified that I am speaking with the correct person using two identifiers.  Location: Patient: at home  Provider: [facility]    I discussed the limitations of evaluation and management by telemedicine and the availability of in person appointments. The patient expressed understanding and agreed to proceed.  History of Present Illness: [name] has been established patient in our clinic and carries a diagnosis of complex sleep apnea CPAP did insufficiently addressed those issues and she was therefore switched to BiPAP.  She has been using BiPAP since May 2020 for now and has been highly compliant even not able to get a baseline home sleep test ordered as her insurance has a remarkable silly clause that does not allow coverage of sleep studies.!  The patient has been using the machine 100% of days and 87% by time with an average of 5 hours 4 minutes, she is using an auto BiPAP with a 4 cm pressure spread allowing pressures between 10/6 and 20 over 16 cm water.  Her residual is an AHI of 5.8/h and her 95th percentile is C 12/8 setting.  There are some residual obstructive apnea still present but very few centrals.  Airleak on her fullface mask is actually better controlled than in the past.  I had sent her to a durable medical equipment company for a new BiPAP machine as well as for a fitting of a not full facemask, but the respiratory tech at the DME told her that he would not recommend to switch.  This is a patient is retrognathia, btw.    Her baseline health has changed : the patient lost 40 pounds over  a period of just over 6 months on Munjaro. Current weight is followed by Caitlin Moran, she is enrolled in a clinical trial through his office, weighs less than 190 pounds now.  Has been doing well on BiPAP and is compliant. Residual AHI 5.8/h , DME recommended to stay  on FFM not to change in spite of  overbite/ retrognathia.   I will briefly contact them and ask them to allow her a nasal interface     Observations/Objective:     Assessment and Plan:  DME order for an alternative interface, at [employer]. Has been doing well on BiPAP and is compliant. Residual AHI 5.8/h , DME recommended to stay on FFM not to change in spite of  overbite/ retrognathia.   I will briefly contact them and ask them to allow her a nasal interface   RV in 6 months with either Caitlin Moran or me.    Follow Up Instructions:  Next year we need to repeat a HST to see if her apnea may have resoled.      I discussed the assessment and treatment plan with the patient. The patient was provided an opportunity to ask questions and all were answered. The patient agreed with the plan and demonstrated an understanding of the instructions.   The patient was advised to call back or seek an in-person evaluation if the symptoms worsen or if the condition fails to improve as anticipated.  I provided 17 minutes of non-face-to-face time during this encounter.   Caitlin Novas, MD   History:  Caitlin Moran is a 62 y.o. female patient who is here for revisit 06/14/2022 , she was last seen by Caitlin Dapper, Caitlin  and having had  multiple phone conversations with Caitlin Moran and our sleep clinic.   " needing OSA treatment but she cannot afford a sleep study".   Chief concern according to patient :  "My insurance doesn't cover a sleep study" . She was originally re-referred by Caitlin Moran on 05-09-2018. PCP was concerned about rising H and H.  Caitlin Moran had already made a referral to hematology and the work-up has thus far not indicated as a cause of the elevated hemoglobin and hematocrit.     Caitlin Moran is a 62 y.o. female patient that had been seen here since 2013, when she was tested for OSA and following that was using CPAP but then lost coverage by insurance. Last contact was with Caitlin Moran,  05-18-2022 and last visit on 03-23-2022.      Caitlin Moran's compliance report for her AutoSet CPAP is at  a daily  87% compliance which makes 26 out of 30 days, but she has trouble reaching the 4-hour mark at night.  Her compliance by hours is 65% and the average user time (on days used) is 5 hours 17 minutes.  This is an AutoSet at the manufacturer setting, which means 5 cm through 20 cm water pressure with 3 cm expiratory relief - but it leaves her with a residual AHI of 11.6/h.   It also is interesting that the 95th percentile pressure is 17 cm water-  very high.   Most of the residual respiratory events are hypopneas : 7.8/h, and  about 3/h are obstructive sleep apneas.   There is no central sleep apnea emerging , which is good.     While I do not understand that her insurance is not covering a sleep study in a patient who is obviously under served by a auto titration CPAP,  I do think we have some additional options to look at higher pressures -such as her 95th percentile at 17 cm water  to be more tolerated - and that can hopefully be achieved on  BiPAP.  Since titration in lab is indicated but not covered by insurance, we may need to use auto BIPAP titration.

## 2022-10-17 ENCOUNTER — Telehealth: Payer: Self-pay

## 2022-10-21 DIAGNOSIS — G4733 Obstructive sleep apnea (adult) (pediatric): Secondary | ICD-10-CM | POA: Diagnosis not present

## 2022-10-26 DIAGNOSIS — L4059 Other psoriatic arthropathy: Secondary | ICD-10-CM | POA: Diagnosis not present

## 2022-10-26 DIAGNOSIS — Z79899 Other long term (current) drug therapy: Secondary | ICD-10-CM | POA: Diagnosis not present

## 2022-10-26 DIAGNOSIS — R5382 Chronic fatigue, unspecified: Secondary | ICD-10-CM | POA: Diagnosis not present

## 2022-10-26 DIAGNOSIS — G8929 Other chronic pain: Secondary | ICD-10-CM | POA: Diagnosis not present

## 2022-10-26 DIAGNOSIS — L409 Psoriasis, unspecified: Secondary | ICD-10-CM | POA: Diagnosis not present

## 2022-11-01 DIAGNOSIS — G4733 Obstructive sleep apnea (adult) (pediatric): Secondary | ICD-10-CM | POA: Diagnosis not present

## 2022-11-03 ENCOUNTER — Telehealth: Payer: Self-pay

## 2022-11-03 DIAGNOSIS — N200 Calculus of kidney: Secondary | ICD-10-CM

## 2022-11-03 NOTE — Telephone Encounter (Signed)
Booking sheet submitted for right URS for her multiple right renal vs possible UPJ stones.

## 2022-11-03 NOTE — Telephone Encounter (Signed)
Patient called in and left a message that she has thought about it and she would like to be scheduled for a Ureteroscopy for her stone.

## 2022-11-06 ENCOUNTER — Other Ambulatory Visit: Payer: Self-pay

## 2022-11-06 DIAGNOSIS — N201 Calculus of ureter: Secondary | ICD-10-CM

## 2022-11-06 NOTE — Progress Notes (Unsigned)
Surgical Physician Order Form [facility] [place]  Dr. Apolinar Junes * Scheduling expectation : Next Available  *Length of Case:   *Clearance needed: no  *Anticoagulation Instructions: N/A  *Aspirin Instructions: Hold Aspirin  *Post-op visit Date/Instructions:  TBD  *Diagnosis: Right renal stones, possible right UPJ stone  *Procedure: right Ureteroscopy w/laser lithotripsy & stent placement ([address])   Additional orders: N/A  -Admit type: OUTpatient  -Anesthesia: General  -VTE Prophylaxis Standing Order SCD's       Other:   -Standing Lab Orders Per Anesthesia    Lab other: UA&Urine Culture  -Standing Test orders EKG/Chest x-ray per Anesthesia       Test other:   - Medications:  Ancef 2gm IV  -Other orders:  N/A

## 2022-11-07 ENCOUNTER — Telehealth: Payer: Self-pay

## 2022-11-07 NOTE — Progress Notes (Signed)
   [facility] Surgical Posting Form  Surgery Date: Date: 12/25/2022  Surgeon: Dr. Vanna Scotland, MD  Inpt ( No  )   Outpt (Yes)   Obs ( No  )   Diagnosis: N20.1 Right Ureteral Stone  -CPT: [id number]  Surgery: Right Ureteroscopy with Laser Lithotripsy and Stent Placement   Stop Anticoagulations: Yes and also hold ASA  Cardiac/Medical/Pulmonary Clearance needed: no  *Orders entered into EPIC  Date: 11/07/22   *Case booked in [place]  Date: 11/06/2022  *Notified pt of Surgery: Date: 11/06/2022  PRE-OP UA & CX: yes, will obtain in clinic on 12/14/2022  *Placed into Prior Authorization Work Angela Nevin Date: 11/07/22  Assistant/laser/rep:No

## 2022-11-07 NOTE — Telephone Encounter (Signed)
Per Dr. Apolinar Junes,  Patient is to be scheduled for Right Ureteroscopy with Laser Lithotripsy and Stent Placement   [name] was contacted and possible surgical dates were discussed, Monday October 14th, 2024 was agreed upon for surgery.   Patient was instructed that Dr. Apolinar Junes will require them to provide a pre-op UA & CX prior to surgery. This was ordered and scheduled drop off appointment was made for 12/14/2022.    Patient was directed to call [phone] between 1-3pm the day before surgery to find out surgical arrival time.  Instructions were given not to eat or drink from midnight on the night before surgery and have a driver for the day of surgery. On the surgery day patient was instructed to enter through the [facility] entrance of [facility] report the Same Day Surgery desk.   Pre-Admit Testing will be in contact via phone to set up an interview with the anesthesia team to review your history and medications prior to surgery.   Reminder of this information was sent via MyChart to the patient.

## 2022-11-21 ENCOUNTER — Ambulatory Visit: Payer: BC Managed Care – PPO | Admitting: Family Medicine

## 2022-11-21 DIAGNOSIS — G4733 Obstructive sleep apnea (adult) (pediatric): Secondary | ICD-10-CM | POA: Diagnosis not present

## 2022-12-14 ENCOUNTER — Other Ambulatory Visit: Payer: BC Managed Care – PPO

## 2022-12-14 DIAGNOSIS — N201 Calculus of ureter: Secondary | ICD-10-CM

## 2022-12-14 LAB — URINALYSIS, COMPLETE
Bilirubin, UA: NEGATIVE
Glucose, UA: NEGATIVE
Leukocytes,UA: NEGATIVE
Nitrite, UA: NEGATIVE
Specific Gravity, UA: >1.03 — ABNORMAL HIGH (ref 1.005–1.030)
Urobilinogen, Ur: 1 mg/dL (ref 0.2–1.0)
pH, UA: 6 (ref 5.0–7.5)

## 2022-12-14 LAB — MICROSCOPIC EXAMINATION: RBC, Urine: >30 /[HPF] — AB (ref 0–2)

## 2022-12-18 ENCOUNTER — Encounter
Admission: RE | Admit: 2022-12-18 | Discharge: 2022-12-18 | Disposition: A | Discharge status: Home / Self Care | Payer: BC Managed Care – PPO | Source: Ambulatory Visit | Attending: Urology | Admitting: Urology

## 2022-12-18 ENCOUNTER — Other Ambulatory Visit: Payer: Self-pay

## 2022-12-18 HISTORY — DX: Spondylosis without myelopathy or radiculopathy, cervical region: M47.812

## 2022-12-18 HISTORY — DX: Hyperlipidemia, unspecified: E78.5

## 2022-12-18 HISTORY — DX: Arthropathic psoriasis, unspecified: L40.50

## 2022-12-18 HISTORY — DX: Obstructive sleep apnea (adult) (pediatric): G47.33

## 2022-12-18 LAB — CULTURE, URINE COMPREHENSIVE

## 2022-12-18 NOTE — Patient Instructions (Addendum)
Your procedure is scheduled on: Monday 12/25/22 To find out your arrival time, please call [phone] between 1PM - 3PM on: Friday 12/22/22   Report to the Registration Desk on the 1st floor of the [facility]. Free Valet parking is available.  If your arrival time is 6:00 am, do not arrive before that time as the [facility] entrance doors do not open until 6:00 am.  REMEMBER: Instructions that are not followed completely may result in serious medical risk, up to and including death; or upon the discretion of your surgeon and anesthesiologist your surgery may need to be rescheduled.  Do not eat food or drink any liquids after midnight the night before surgery.  No gum chewing or hard candies.  One week prior to surgery: Stop Anti-inflammatories (NSAIDS) such as Advil, Aleve, Ibuprofen, Motrin, Naproxen, Naprosyn and Aspirin based products such as Excedrin, Goody's Powder, BC Powder. You may however, continue to take Tylenol if needed for pain up until the day of surgery.  Stop ANY OVER THE COUNTER supplements or vitamins until after surgery. You can continue the probiotic.  Continue taking all prescribed medications with the exception of the following: Mounjaro, hold for 7 days. May restart after your procedure. Aspirin last dose Thursday 12/21/22.  TAKE ONLY THESE MEDICATIONS THE MORNING OF SURGERY WITH A SIP OF WATER:  omeprazole (PRILOSEC) 40 MG capsule Antacid (take one the night before and one on the morning of surgery - helps to prevent nausea after surgery.) rosuvastatin (CRESTOR) 40 MG tablet  vortioxetine HBr (TRINTELLIX)   No Alcohol for 24 hours before or after surgery.  No Smoking including e-cigarettes for 24 hours before surgery.  No chewable tobacco products for at least 6 hours before surgery.  No nicotine patches on the day of surgery.  Do not use any "recreational" drugs for at least a week (preferably 2 weeks) before your surgery.  Please be advised that  the combination of cocaine and anesthesia may have negative outcomes, up to and including death. If you test positive for cocaine, your surgery will be cancelled.  On the morning of surgery brush your teeth with toothpaste and water, you may rinse your mouth with mouthwash if you wish. Do not swallow any toothpaste or mouthwash.  Use CHG Soap or wipes as directed on instruction sheet. Shower the morning of your procedure with your regular soap/  Do not wear lotions, powders, or perfumes. You may use deodorant.  Do not shave body hair from the neck down 48 hours before surgery.  Wear comfortable clothing (specific to your surgery type) to the hospital.  Do not wear jewelry, make-up, hairpins, clips or nail polish.  For welded (permanent) jewelry: bracelets, anklets, waist bands, etc.  Please have this removed prior to surgery.  If it is not removed, there is a chance that hospital personnel will need to cut it off on the day of surgery. Contact lenses, hearing aids and dentures may not be worn into surgery.  Bring your C-PAP to the hospital in case you may have to spend the night.   Do not bring valuables to the hospital. [facility] is not responsible for any missing/lost belongings or valuables.   Notify your doctor if there is any change in your medical condition (cold, fever, infection).  If you are being discharged the day of surgery, you will not be allowed to drive home. You will need a responsible individual to drive you home and stay with you for 24 hours after  surgery.   If you are taking public transportation, you will need to have a responsible individual with you.  If you are being admitted to the hospital overnight, leave your suitcase in the car. After surgery it may be brought to your room.  In case of increased patient census, it may be necessary for you, the patient, to continue your postoperative care in the Same Day Surgery department.  After surgery, you can help  prevent lung complications by doing breathing exercises.  Take deep breaths and cough every 1-2 hours. Your doctor may order a device called an Incentive Spirometer to help you take deep breaths. When coughing or sneezing, hold a pillow firmly against your incision with both hands. This is called "splinting." Doing this helps protect your incision. It also decreases belly discomfort.  Surgery Visitation Policy:  Patients undergoing a surgery or procedure may have two family members or support persons with them as long as the person is not COVID-19 positive or experiencing its symptoms.   Inpatient Visitation:    Visiting hours are 7 a.m. to 8 p.m. Up to four visitors are allowed at one time in a patient room. The visitors may rotate out with other people during the day. One designated support person (adult) may remain overnight.  Please call the Pre-admissions Testing Dept. at [phone] if you have any questions about these instructions.

## 2022-12-19 ENCOUNTER — Telehealth: Payer: Self-pay

## 2022-12-19 DIAGNOSIS — N201 Calculus of ureter: Secondary | ICD-10-CM

## 2022-12-19 MED ORDER — CEFUROXIME AXETIL 250 MG PO TABS
250.0000 mg | ORAL_TABLET | Freq: Two times a day (BID) | ORAL | 0 refills | Status: AC
Start: 2022-12-19 — End: 2022-12-24

## 2022-12-19 MED ORDER — SULFAMETHOXAZOLE-TRIMETHOPRIM 800-160 MG PO TABS
1.0000 | ORAL_TABLET | Freq: Two times a day (BID) | ORAL | 0 refills | Status: AC
Start: 2022-12-19 — End: 2022-12-24

## 2022-12-19 NOTE — Telephone Encounter (Signed)
Pt called triage line asking about her UA results. She has surgery on Monday and wanted to know if she will need antibiotics prior. Please advise.

## 2022-12-19 NOTE — Addendum Note (Signed)
Addended by: Debarah Crape on: 12/19/2022 05:04 PM   Modules accepted: Orders

## 2022-12-19 NOTE — Telephone Encounter (Signed)
Her urine culture grew two different bacteria with conflicting sensitivity patterns, so we'll need to give her 2 preop antibiotics. I'm sending in Bactrim DS BID x5 days and cefuroxime 250mg  BID x5 days to the [facility] in [place]; she should start both of these tomorrow in advance of surgery.

## 2022-12-20 NOTE — Telephone Encounter (Signed)
Pt informed, voiced understanding

## 2022-12-21 DIAGNOSIS — G4733 Obstructive sleep apnea (adult) (pediatric): Secondary | ICD-10-CM | POA: Diagnosis not present

## 2022-12-24 MED ORDER — CHLORHEXIDINE GLUCONATE 0.12 % MT SOLN
15.0000 mL | Freq: Once | OROMUCOSAL | Status: AC
  Administered 2022-12-25: 15 mL via OROMUCOSAL

## 2022-12-24 MED ORDER — CEFAZOLIN SODIUM-DEXTROSE 2-4 GM/100ML-% IV SOLN
2.0000 g | INTRAVENOUS | Status: AC
  Administered 2022-12-25: 2 g via INTRAVENOUS

## 2022-12-24 MED ORDER — ORAL CARE MOUTH RINSE: 15.0000 mL | Freq: Once | OROMUCOSAL | Status: AC

## 2022-12-24 MED ORDER — LACTATED RINGERS IV SOLN: INTRAVENOUS | Status: DC

## 2022-12-25 ENCOUNTER — Encounter
Admission: RE | Disposition: A | Discharge status: Home / Self Care | Payer: Self-pay | Source: Home / Self Care | Attending: Urology

## 2022-12-25 ENCOUNTER — Other Ambulatory Visit: Payer: Self-pay

## 2022-12-25 ENCOUNTER — Ambulatory Visit
Admission: RE | Admit: 2022-12-25 | Discharge: 2022-12-25 | Disposition: A | Discharge status: Home / Self Care | Payer: BC Managed Care – PPO | Attending: Urology | Admitting: Urology

## 2022-12-25 ENCOUNTER — Ambulatory Visit: Payer: BC Managed Care – PPO

## 2022-12-25 ENCOUNTER — Encounter: Payer: Self-pay | Admitting: Urology

## 2022-12-25 ENCOUNTER — Ambulatory Visit: Payer: BC Managed Care – PPO | Admitting: Anesthesiology

## 2022-12-25 ENCOUNTER — Ambulatory Visit: Payer: BC Managed Care – PPO | Admitting: Urgent Care

## 2022-12-25 DIAGNOSIS — F419 Anxiety disorder, unspecified: Secondary | ICD-10-CM | POA: Insufficient documentation

## 2022-12-25 DIAGNOSIS — N201 Calculus of ureter: Secondary | ICD-10-CM | POA: Diagnosis not present

## 2022-12-25 DIAGNOSIS — Z6832 Body mass index (BMI) 32.0-32.9, adult: Secondary | ICD-10-CM | POA: Diagnosis not present

## 2022-12-25 DIAGNOSIS — N200 Calculus of kidney: Secondary | ICD-10-CM | POA: Diagnosis not present

## 2022-12-25 DIAGNOSIS — E669 Obesity, unspecified: Secondary | ICD-10-CM | POA: Diagnosis not present

## 2022-12-25 DIAGNOSIS — M1990 Unspecified osteoarthritis, unspecified site: Secondary | ICD-10-CM | POA: Insufficient documentation

## 2022-12-25 DIAGNOSIS — K219 Gastro-esophageal reflux disease without esophagitis: Secondary | ICD-10-CM | POA: Insufficient documentation

## 2022-12-25 DIAGNOSIS — F32A Depression, unspecified: Secondary | ICD-10-CM | POA: Insufficient documentation

## 2022-12-25 DIAGNOSIS — G4733 Obstructive sleep apnea (adult) (pediatric): Secondary | ICD-10-CM | POA: Diagnosis not present

## 2022-12-25 DIAGNOSIS — N202 Calculus of kidney with calculus of ureter: Secondary | ICD-10-CM | POA: Diagnosis not present

## 2022-12-25 DIAGNOSIS — Z87891 Personal history of nicotine dependence: Secondary | ICD-10-CM | POA: Insufficient documentation

## 2022-12-25 DIAGNOSIS — D45 Polycythemia vera: Secondary | ICD-10-CM | POA: Diagnosis not present

## 2022-12-25 HISTORY — PX: CYSTOSCOPY/URETEROSCOPY/HOLMIUM LASER/STENT PLACEMENT: SHX6546

## 2022-12-25 HISTORY — PX: CYSTOSCOPY W/ RETROGRADES: SHX1426

## 2022-12-25 SURGERY — CYSTOSCOPY/URETEROSCOPY/HOLMIUM LASER/STENT PLACEMENT
Anesthesia: General | Site: Ureter | Laterality: Right

## 2022-12-25 MED ORDER — MIDAZOLAM HCL 2 MG/2ML IJ SOLN
INTRAMUSCULAR | Status: AC
  Filled 2022-12-25: qty 2

## 2022-12-25 MED ORDER — SCOPOLAMINE 1 MG/3DAYS TD PT72
MEDICATED_PATCH | TRANSDERMAL | Status: AC
  Filled 2022-12-25: qty 1

## 2022-12-25 MED ORDER — OXYBUTYNIN CHLORIDE 5 MG PO TABS: 5.0000 mg | ORAL_TABLET | Freq: Three times a day (TID) | ORAL | 0 refills | Status: DC | PRN

## 2022-12-25 MED ORDER — SCOPOLAMINE 1 MG/3DAYS TD PT72
1.0000 | MEDICATED_PATCH | TRANSDERMAL | Status: DC
  Administered 2022-12-25: 1.5 mg via TRANSDERMAL

## 2022-12-25 MED ORDER — ACETAMINOPHEN 10 MG/ML IV SOLN
INTRAVENOUS | Status: AC
  Filled 2022-12-25: qty 100

## 2022-12-25 MED ORDER — PHENYLEPHRINE 80 MCG/ML (10ML) SYRINGE FOR IV PUSH (FOR BLOOD PRESSURE SUPPORT)
PREFILLED_SYRINGE | INTRAVENOUS | Status: DC | PRN
  Administered 2022-12-25 (×3): 80 ug via INTRAVENOUS

## 2022-12-25 MED ORDER — SUGAMMADEX SODIUM 200 MG/2ML IV SOLN
INTRAVENOUS | Status: DC | PRN
  Administered 2022-12-25: 200 mg via INTRAVENOUS

## 2022-12-25 MED ORDER — PROPOFOL 10 MG/ML IV BOLUS
INTRAVENOUS | Status: DC | PRN
  Administered 2022-12-25: 150 mg via INTRAVENOUS

## 2022-12-25 MED ORDER — DEXAMETHASONE SODIUM PHOSPHATE 10 MG/ML IJ SOLN
INTRAMUSCULAR | Status: DC | PRN
  Administered 2022-12-25: 10 mg via INTRAVENOUS

## 2022-12-25 MED ORDER — FENTANYL CITRATE (PF) 100 MCG/2ML IJ SOLN
INTRAMUSCULAR | Status: DC | PRN
  Administered 2022-12-25 (×2): 50 ug via INTRAVENOUS

## 2022-12-25 MED ORDER — CEFAZOLIN SODIUM-DEXTROSE 2-4 GM/100ML-% IV SOLN
INTRAVENOUS | Status: AC
  Filled 2022-12-25: qty 100

## 2022-12-25 MED ORDER — IOHEXOL 180 MG/ML  SOLN
INTRAMUSCULAR | Status: DC | PRN
  Administered 2022-12-25: 20 mL

## 2022-12-25 MED ORDER — ONDANSETRON HCL 4 MG/2ML IJ SOLN
INTRAMUSCULAR | Status: DC | PRN
  Administered 2022-12-25 (×2): 4 mg via INTRAVENOUS

## 2022-12-25 MED ORDER — FENTANYL CITRATE (PF) 100 MCG/2ML IJ SOLN
INTRAMUSCULAR | Status: AC
  Filled 2022-12-25: qty 2

## 2022-12-25 MED ORDER — MIDAZOLAM HCL 2 MG/2ML IJ SOLN
INTRAMUSCULAR | Status: DC | PRN
  Administered 2022-12-25: 2 mg via INTRAVENOUS

## 2022-12-25 MED ORDER — ROCURONIUM BROMIDE 100 MG/10ML IV SOLN
INTRAVENOUS | Status: DC | PRN
  Administered 2022-12-25: 40 mg via INTRAVENOUS
  Administered 2022-12-25: 10 mg via INTRAVENOUS

## 2022-12-25 MED ORDER — ACETAMINOPHEN 10 MG/ML IV SOLN
INTRAVENOUS | Status: DC | PRN
  Administered 2022-12-25: 1000 mg via INTRAVENOUS

## 2022-12-25 MED ORDER — GLYCOPYRROLATE 0.2 MG/ML IJ SOLN
INTRAMUSCULAR | Status: DC | PRN
  Administered 2022-12-25: .2 mg via INTRAVENOUS

## 2022-12-25 MED ORDER — LIDOCAINE HCL (CARDIAC) PF 100 MG/5ML IV SOSY
PREFILLED_SYRINGE | INTRAVENOUS | Status: DC | PRN
  Administered 2022-12-25: 100 mg via INTRAVENOUS

## 2022-12-25 MED ORDER — EPHEDRINE SULFATE-NACL 50-0.9 MG/10ML-% IV SOSY
PREFILLED_SYRINGE | INTRAVENOUS | Status: DC | PRN
  Administered 2022-12-25 (×2): 5 mg via INTRAVENOUS

## 2022-12-25 MED ORDER — PROPOFOL 1000 MG/100ML IV EMUL
INTRAVENOUS | Status: AC
  Filled 2022-12-25: qty 200

## 2022-12-25 MED ORDER — ESMOLOL HCL 100 MG/10ML IV SOLN
INTRAVENOUS | Status: DC | PRN
Start: 2022-12-25 — End: 2022-12-25
  Administered 2022-12-25: 10 mg via INTRAVENOUS

## 2022-12-25 MED ORDER — TAMSULOSIN HCL 0.4 MG PO CAPS: 0.4000 mg | ORAL_CAPSULE | Freq: Every day | ORAL | 0 refills | Status: DC

## 2022-12-25 MED ORDER — HYDROCODONE-ACETAMINOPHEN 5-325 MG PO TABS
1.0000 | ORAL_TABLET | Freq: Four times a day (QID) | ORAL | 0 refills | Status: DC | PRN
Start: 2022-12-25 — End: 2023-09-07

## 2022-12-25 MED ORDER — CHLORHEXIDINE GLUCONATE 0.12 % MT SOLN
OROMUCOSAL | Status: AC
  Filled 2022-12-25: qty 15

## 2022-12-25 MED ORDER — SODIUM CHLORIDE 0.9 % IR SOLN
Status: DC | PRN
  Administered 2022-12-25: 3000 mL

## 2022-12-25 SURGICAL SUPPLY — 28 items
ADH LQ OCL WTPRF AMP STRL LF (MISCELLANEOUS) ×2
ADHESIVE MASTISOL STRL (MISCELLANEOUS) IMPLANT
BAG DRAIN SIEMENS DORNER NS (MISCELLANEOUS) ×2 IMPLANT
BAG DRN NS LF (MISCELLANEOUS) ×2
BASKET ZERO TIP 1.9FR (BASKET) IMPLANT
BRUSH SCRUB EZ 1% IODOPHOR (MISCELLANEOUS) ×2 IMPLANT
BSKT STON RTRVL ZERO TP 1.9FR (BASKET) ×2
CATH URET FLEX-TIP 2 LUMEN 10F (CATHETERS) IMPLANT
CATH URETL OPEN 5X70 (CATHETERS) ×2 IMPLANT
CNTNR URN SCR LID CUP LEK RST (MISCELLANEOUS) IMPLANT
CONT SPEC 4OZ STRL OR WHT (MISCELLANEOUS)
DRAPE UTILITY 15X26 TOWEL STRL (DRAPES) ×2 IMPLANT
DRSG TEGADERM 2-3/8X2-3/4 SM (GAUZE/BANDAGES/DRESSINGS) IMPLANT
FIBER LASER MOSES 200 DFL (Laser) IMPLANT
GLOVE BIO SURGEON STRL SZ 6.5 (GLOVE) ×2 IMPLANT
GOWN STRL REUS W/ TWL LRG LVL3 (GOWN DISPOSABLE) ×4 IMPLANT
GOWN STRL REUS W/TWL LRG LVL3 (GOWN DISPOSABLE) ×4
GUIDEWIRE GREEN .038 145CM (MISCELLANEOUS) IMPLANT
GUIDEWIRE STR DUAL SENSOR (WIRE) ×2 IMPLANT
IV NS IRRIG 3000ML ARTHROMATIC (IV SOLUTION) ×2 IMPLANT
KIT TURNOVER CYSTO (KITS) ×2 IMPLANT
PACK CYSTO AR (MISCELLANEOUS) ×2 IMPLANT
SET CYSTO W/LG BORE CLAMP LF (SET/KITS/TRAYS/PACK) ×2 IMPLANT
SHEATH NAVIGATOR HD 12/14X36 (SHEATH) IMPLANT
STENT URET 6FRX24 CONTOUR (STENTS) IMPLANT
STENT URET 6FRX26 CONTOUR (STENTS) IMPLANT
SURGILUBE 2OZ TUBE FLIPTOP (MISCELLANEOUS) ×2 IMPLANT
WATER STERILE IRR 500ML POUR (IV SOLUTION) ×2 IMPLANT

## 2022-12-25 NOTE — Op Note (Signed)
Date of procedure: 12/25/22  Preoperative diagnosis:  Right kidney stone  Postoperative diagnosis:  Same as above  Procedure: Right ureteroscopy Laser lithotripsy Right ureteral stent placement Basket extraction of stone fragment Retrograde pyelogram Interpretation of fluoroscopy less than 30 minutes  Surgeon: Vanna Scotland, MD  Anesthesia: General  Complications: None  Intraoperative findings: Fairly narrow UPJ.  2 approximately 3 to 4 mm stones identified, nonobstructing in the kidney.  Despite being relatively small, required significant fragmentation in order to remove with basket due to narrow UPJ.  Stent left on tether.  EBL: Minimal  Specimens: Stone fragment  Drains: 624 French double-J ureteral stent on right with tether  Indication: Caitlin Moran is a 62 y.o. patient with personal history of nephrolithiasis status post ESWL with residual fragments in the kidney and intermittent symptoms including flank pain and hematuria.  After reviewing the management options for treatment,   elected to proceed with the above surgical procedure(s). We have discussed the potential benefits and risks of the procedure, side effects of the proposed treatment, the likelihood of the patient achieving the goals of the procedure, and any potential problems that might occur during the procedure or recuperation. Informed consent has been obtained.  Description of procedure:  The patient was taken to the operating room and general anesthesia was induced.  The patient was placed in the dorsal lithotomy position, prepped and draped in the usual sterile fashion, and preoperative antibiotics were administered. A preoperative time-out was performed.   A 21 French scope was advanced per urethra into the bladder.  Attention was turned to the right UO..  This was then cannulated using a sensor wire up to the level of the kidney.  A retrograde pyelogram revealed no filling defects but there was some  narrowing at the UPJ.  There is no hydronephrosis appreciated.  On scout imaging, there are some calcifications overlying the kidney but no ureteral calcifications appreciated.  A sensor wire was then placed up to the level of the kidney.  A dual-lumen ureteral access sheath was used just within the distal ureter.  A Super Stiff wire was then introduced all the way up to the level of the kidney through the second lumen of the catheter.  The sensor wire was snapped in place as a safety wire.  A ureteral access sheath, 11/13 [place] was advanced to the level of the proximal ureter.  A digital flexible dual-lumen ureteroscope was then introduced and advanced into the renal pelvis.  A 200 m laser fiber was then brought in using dusting settings of 0.3 J and 80 Hz, 2 very small stones, each 3 to 4 mm were encountered.  There was 4 nonobstructing.  First, use a 1.9 French tipless nitinol basket to try to extract these but given the narrow UPJ, this was not successful.  I ended up having to fragment these and ultimately did remove some of the fragments using the nitinol basket.  The remainder of the stone material was dusted.   At the end of the procedure, no significant residual stone fragment remained greater than the size of the tip of the laser fiber.  A final retrograde pyelogram created roadmap to ensure that each every calyx was visualized and all significant stone burden was addressed.  There was no contrast extravasation.  The scope was then backed down the length of the ureter inspecting the ureteral integrity along the way.  There was no residual stone burden or significant ureteral injuries appreciated.  Finally, a 6  by 24 French ureteral access sheath was advanced over the safety wire up to the level of the kidney.  Upon wire removal, there was a full coil noted both within the renal pelvis as well as within the bladder.  The stent string was left at fixed to the distal coil of the stent and secured to the  patient's left inner thigh using Mastisol and Tegaderm.  The patient was then cleaned and dried, repositioned in supine position, reversed of anesthesia, taken to the PACU in stable condition.  Plan: She will keep her stent for a week.  Follow-up in 6 weeks with renal ultrasound prior.  Vanna Scotland, M.D.

## 2022-12-25 NOTE — Anesthesia Procedure Notes (Addendum)
Procedure Name: Intubation Date/Time: 12/25/2022 7:53 AM  Performed by: Mohammed Kindle, CRNAPre-anesthesia Checklist: Patient identified, Emergency Drugs available, Suction available and Patient being monitored Patient Re-evaluated:Patient Re-evaluated prior to induction Oxygen Delivery Method: Circle system utilized Preoxygenation: Pre-oxygenation with 100% oxygen Induction Type: IV induction Ventilation: Mask ventilation without difficulty Laryngoscope Size: McGraph and 3 Grade View: Grade I Tube type: Oral Number of attempts: 1 Airway Equipment and Method: Stylet Placement Confirmation: ETT inserted through vocal cords under direct vision, positive ETCO2, breath sounds checked- equal and bilateral and CO2 detector Secured at: 21 cm Tube secured with: Tape Dental Injury: Teeth and Oropharynx as per pre-operative assessment

## 2022-12-25 NOTE — Anesthesia Preprocedure Evaluation (Addendum)
Anesthesia Evaluation  Patient identified by MRN, date of birth, ID band Patient awake    Reviewed: Allergy & Precautions, NPO status , Patient's Chart, lab work & pertinent test results  History of Anesthesia Complications (+) PONV and history of anesthetic complications  Airway Mallampati: IV   Neck ROM: Full    Dental  (+) Missing   Pulmonary sleep apnea and Continuous Positive Airway Pressure Ventilation , former smoker (quit 1992)   Pulmonary exam normal breath sounds clear to auscultation       Cardiovascular Exercise Tolerance: Good negative cardio ROS Normal cardiovascular exam Rhythm:Regular Rate:Normal     Neuro/Psych  PSYCHIATRIC DISORDERS Anxiety Depression    negative neurological ROS     GI/Hepatic ,GERD  Medicated and Controlled,,  Endo/Other  Obesity   Renal/GU      Musculoskeletal  (+) Arthritis ,    Abdominal   Peds  Hematology  (+) Blood dyscrasia (polycythemia vera)   Anesthesia Other Findings   Reproductive/Obstetrics                             Anesthesia Physical Anesthesia Plan  ASA: 2  Anesthesia Plan: General   Post-op Pain Management:    Induction: Intravenous  PONV Risk Score and Plan: 4 or greater and Ondansetron, Dexamethasone, Treatment may vary due to age or medical condition and Scopolamine patch - Pre-op  Airway Management Planned: Oral ETT  Additional Equipment:   Intra-op Plan:   Post-operative Plan: Extubation in OR  Informed Consent: I have reviewed the patients History and Physical, chart, labs and discussed the procedure including the risks, benefits and alternatives for the proposed anesthesia with the patient or authorized representative who has indicated his/her understanding and acceptance.     Dental advisory given  Plan Discussed with: CRNA  Anesthesia Plan Comments: (Patient consented for risks of anesthesia including  but not limited to:  - adverse reactions to medications - damage to eyes, teeth, lips or other oral mucosa - nerve damage due to positioning  - sore throat or hoarseness - damage to heart, brain, nerves, lungs, other parts of body or loss of life  Informed patient about role of CRNA in peri- and intra-operative care.  Patient voiced understanding.)        Anesthesia Quick Evaluation

## 2022-12-25 NOTE — Anesthesia Postprocedure Evaluation (Signed)
Anesthesia Post Note  Patient: Caitlin Moran  Procedure(s) Performed: CYSTOSCOPY/URETEROSCOPY/HOLMIUM LASER/STENT PLACEMENT (Right) CYSTOSCOPY WITH RETROGRADE PYELOGRAM (Right: Ureter)  Patient location during evaluation: PACU Anesthesia Type: General Level of consciousness: awake and alert, oriented and patient cooperative Pain management: pain level controlled Vital Signs Assessment: post-procedure vital signs reviewed and stable Respiratory status: spontaneous breathing, nonlabored ventilation and respiratory function stable Cardiovascular status: blood pressure returned to baseline and stable Postop Assessment: adequate PO intake Anesthetic complications: no   No notable events documented.   Last Vitals:  Vitals:   12/25/22 0929 12/25/22 0931  BP:  (!) 108/59  Pulse: 70 70  Resp: 17 12  Temp: (!) 36.2 C   SpO2: 97% 96%    Last Pain:  Vitals:   12/25/22 0929  TempSrc:   PainSc: 0-No pain                 Reed Breech

## 2022-12-25 NOTE — Transfer of Care (Signed)
Immediate Anesthesia Transfer of Care Note  Patient: Caitlin Moran  Procedure(s) Performed: CYSTOSCOPY/URETEROSCOPY/HOLMIUM LASER/STENT PLACEMENT (Right) CYSTOSCOPY WITH RETROGRADE PYELOGRAM (Right: Ureter)  Patient Location: PACU  Anesthesia Type:General  Level of Consciousness: awake, drowsy, and patient cooperative  Airway & Oxygen Therapy: Patient Spontanous Breathing and Patient connected to face mask oxygen  Post-op Assessment: Report given to RN and Post -op Vital signs reviewed and stable  Post vital signs: Reviewed and stable  Last Vitals:  Vitals Value Taken Time  BP 115/61 12/25/22 0845  Temp    Pulse 103 12/25/22 0846  Resp 17 12/25/22 0844  SpO2 90 % 12/25/22 0846  Vitals shown include unfiled device data.  Last Pain:  Vitals:   12/25/22 0624  TempSrc: Temporal  PainSc: 5          Complications: No notable events documented.

## 2022-12-25 NOTE — H&P (Signed)
Updated 12/25/22  RRR CTAB  No changes in medical history  Preop UCx positive, treated appropriately  Caitlin Moran 10-18-1960 [id number]   CC:    Chief Complaint  Patient presents with   Nephrolithiasis    HPI: Caitlin Moran is a 62 y.o. female with PMH microscopic hematuria with benign workup in 2023 and nephrolithiasis who presents today for evaluation of possible kidney stone.    I saw her in clinic most recently on 04/20/2022 for follow-up s/p ESWL of a proximal right ureteral stone.  KUB showed interval migration of one of her right lower pole renal stones into the proximal right ureter.  I recommended consideration of ESWL or URS.   Today she reports her gross hematuria and discomfort resolved, so she assumed that she passed the proximal right ureteral stone.  2 weeks ago, she had return of painless gross hematuria followed by severe right flank pain and vomiting 4 nights ago, which was manageable with medications.  Her symptoms are very mild now, but still present.   KUB today with a stable 6 mm proximal right ureteral stone and interval medial migration of her right lower pole stone, though this appears to still be within the renal pelvis.  Notably, there is another radiopaque density within the renal shadow which may represent a third right lower pole stone.   In-office UA today positive for trace intact blood; urine microscopy with 3-10 RBCs/HPF.   PMH:     Past Medical History:  Diagnosis Date   Anxiety     Arthritis      neck   Depression     GERD (gastroesophageal reflux disease)     Headache     History of kidney stones      kidney stone -lithrotrispy 04/30/18- 2 current - stable   IBS (irritable bowel syndrome)     OSA (obstructive sleep apnea)      on CPAP   Polycythemia vera (HCC)     PONV (postoperative nausea and vomiting)      Didn't go to sleep all the way  during colonoscopy.   Renal calculus, right     Right ureteral stone     Spinal  stenosis            Surgical History:      Past Surgical History:  Procedure Laterality Date   ABLATION        unquestionable   BIOPSY   05/01/2018    Procedure: BIOPSY;  Surgeon: Meridee Score Netty Starring., MD;  Location: [facility] ENDOSCOPY;  Service: Gastroenterology;;   BIOPSY   05/24/2020    Procedure: BIOPSY;  Surgeon: Lemar Lofty., MD;  Location: [facility] ENDOSCOPY;  Service: Gastroenterology;;   CESAREAN SECTION       CHOLECYSTECTOMY       COLONOSCOPY       COLONOSCOPY WITH PROPOFOL N/A 05/24/2020    Procedure: COLONOSCOPY WITH PROPOFOL;  Surgeon: Lemar Lofty., MD;  Location: [facility] ENDOSCOPY;  Service: Gastroenterology;  Laterality: N/A;   ESOPHAGOGASTRODUODENOSCOPY       ESOPHAGOGASTRODUODENOSCOPY N/A 05/01/2018    Procedure: ESOPHAGOGASTRODUODENOSCOPY (EGD);  Surgeon: Lemar Lofty., MD;  Location: [facility] ENDOSCOPY;  Service: Gastroenterology;  Laterality: N/A;   ESOPHAGOGASTRODUODENOSCOPY (EGD) WITH PROPOFOL N/A 05/24/2020    Procedure: ESOPHAGOGASTRODUODENOSCOPY (EGD) WITH PROPOFOL;  Surgeon: Meridee Score Netty Starring., MD;  Location: [facility] ENDOSCOPY;  Service: Gastroenterology;  Laterality: N/A;   EUS N/A 05/01/2018    Procedure: UPPER ENDOSCOPIC ULTRASOUND (EUS) RADIAL;  Surgeon: Lemar Lofty.,  MD;  Location: [facility] ENDOSCOPY;  Service: Gastroenterology;  Laterality: N/A;   EXTRACORPOREAL SHOCK WAVE LITHOTRIPSY   02/14/2010   EXTRACORPOREAL SHOCK WAVE LITHOTRIPSY Right 03/30/2022    Procedure: EXTRACORPOREAL SHOCK WAVE LITHOTRIPSY (ESWL);  Surgeon: Riki Altes, MD;  Location: [facility] ORS;  Service: Urology;  Laterality: Right;   HYSTEROSCOPY WITH D & C N/A 11/27/2012    Procedure: DILATATION AND CURETTAGE /HYSTEROSCOPY;  Surgeon: Zelphia Cairo, MD;  Location: [facility] ORS;  Service: Gynecology;  Laterality: N/A;   SUBMUCOSAL LIFTING INJECTION   05/24/2020    Procedure: SUBMUCOSAL LIFTING INJECTION;  Surgeon: Meridee Score Netty Starring., MD;  Location: [facility] ENDOSCOPY;  Service:  Gastroenterology;;   WISDOM TOOTH EXTRACTION              Home Medications:  Allergies as of 08/03/2022   No Known Allergies         Medication List           Accurate as of Aug 03, 2022 11:49 AM. If you have any questions, ask your nurse or doctor.              STOP taking these medications     Cymbalta 60 MG capsule Generic drug: DULoxetine Stopped by: Carman Ching, PA-C           TAKE these medications     aspirin EC 81 MG tablet Take 81 mg by mouth daily.    Calcium-Vitamin D 600-400 MG-UNIT Tabs Take 1 tablet by mouth daily.    celecoxib 200 MG capsule Commonly known as: CELEBREX Take 200 mg by mouth daily.    clobetasol cream 0.05 % Commonly known as: TEMOVATE Apply 1 application topically daily as needed (for psoriasis).    fish oil-omega-3 fatty acids 1000 MG capsule Take 1 g by mouth daily.    Golimumab 50 MG/0.5ML Soaj Inject 50 mg into the skin every 30 (thirty) days.    HYDROcodone-acetaminophen 7.5-325 MG tablet Commonly known as: NORCO Take 1 tablet by mouth every 6 (six) hours as needed for moderate pain.    methocarbamol 500 MG tablet Commonly known as: ROBAXIN TAKE 1 TABLET BY MOUTH EVERY 8 HOURS AS NEEDED FOR SPASM    omeprazole 40 MG capsule Commonly known as: PRILOSEC Take 1 capsule (40 mg total) by mouth 2 (two) times daily before a meal.    ondansetron 4 MG disintegrating tablet Commonly known as: ZOFRAN-ODT Take 1 tablet (4 mg total) by mouth every 8 (eight) hours as needed for nausea or vomiting.    PROBIOTIC PO Take 1 capsule by mouth daily.    rosuvastatin 5 MG tablet Commonly known as: CRESTOR Take 5 mg by mouth daily.    tamsulosin 0.4 MG Caps capsule Commonly known as: FLOMAX Take 1 capsule (0.4 mg total) by mouth daily.    tiZANidine 2 MG tablet Commonly known as: ZANAFLEX Take 2 mg by mouth 3 (three) times daily as needed.    Turmeric Curcumin 500 MG Caps See admin instructions.    Vitamin D  50 MCG (2000 UT) Caps Take 2,000 Units by mouth daily.    vortioxetine HBr 20 MG Tabs tablet Commonly known as: TRINTELLIX Take 20 mg by mouth daily.             Allergies:  Allergies  No Known Allergies     Family History:      Family History  Problem Relation Age of Onset   Breast cancer Maternal Aunt     Esophageal cancer Mother  Lung cancer Father     CAD Father     Colon cancer Neg Hx     Inflammatory bowel disease Neg Hx     Liver disease Neg Hx     Pancreatic cancer Neg Hx     Stomach cancer Neg Hx     Rectal cancer Neg Hx            Social History:   reports that she quit smoking about 31 years ago. Her smoking use included cigarettes. She has been exposed to tobacco smoke. She has never used smokeless tobacco. She reports current alcohol use of about 7.0 standard drinks of alcohol per week. She reports that she does not use drugs.   Physical Exam: BP 121/77   Pulse 66   Ht 5' 6.5" (1.689 m)   Wt 229 lb (103.9 kg)   LMP 09/24/2012   BMI 36.41 kg/m   Constitutional:  Alert and oriented, no acute distress, nontoxic appearing HEENT: [place], AT Cardiovascular: No clubbing, cyanosis, or edema Respiratory: Normal respiratory effort, no increased work of breathing Skin: No rashes, bruises or suspicious lesions Neurologic: Grossly intact, no focal deficits, moving all 4 extremities Psychiatric: Normal mood and affect   Laboratory Data:      Results for orders placed or performed in visit on 08/03/22  Microscopic Examination    Urine  Result Value Ref Range    WBC, UA 0-5 0 - 5 /hpf    RBC, Urine 3-10 (A) 0 - 2 /hpf    Epithelial Cells (non renal) 0-10 0 - 10 /hpf    Casts Present (A) None seen /lpf    Cast Type Hyaline casts N/A    Mucus, UA Present (A) Not Estab.    Bacteria, UA Few None seen/Few  Urinalysis, Complete  Result Value Ref Range    Specific Gravity, UA 1.015 1.005 - 1.030    pH, UA 6.5 5.0 - 7.5    Color, UA Yellow Yellow     Appearance Ur Clear Clear    Leukocytes,UA Negative Negative    Protein,UA Negative Negative/Trace    Glucose, UA Negative Negative    Ketones, UA Negative Negative    RBC, UA Trace (A) Negative    Bilirubin, UA Negative Negative    Urobilinogen, Ur 1.0 0.2 - 1.0 mg/dL    Nitrite, UA Negative Negative    Microscopic Examination See below:      Pertinent Imaging: KUB, 08/03/2022: CLINICAL DATA:  History of kidney stone   EXAM: ABDOMEN - 1 VIEW   COMPARISON:  April 20, 2022   FINDINGS: The left kidney is significantly obscured by bowel contents. No left-sided stones noted.   The right kidney is partially obscured by bowel contents. There is a stone or stones in the lower pole of the right kidney measuring 8 mm. More centrally, in the region of the right renal pelvis, is a rounded density measuring 6.6 mm which could represent bowel contents or a stone in the region of the renal pelvis.   No ureteral stones are noted.   IMPRESSION: 1. The left kidney is significantly obscured by bowel contents. No left-sided stones identified. 2. The right kidney is partially obscured by bowel contents. There is a stone or stones in the lower pole of the right kidney measuring 8 mm. More centrally, in the region of the right renal pelvis, is a rounded density measuring 6.6 mm which could represent bowel contents or a stone in the region of the renal  pelvis. 3. No ureteral stones identified.     Electronically Signed   By: Gerome Sam III M.D.   On: 08/07/2022 11:38   I personally reviewed the images referenced above and note a stable 6 mm proximal right ureteral stone, interval migration of her right lower pole stone medially into the right renal pelvis, and a possible residual right lower pole stone.   Assessment & Plan:   1. Kidney stones Stable 6 mm proximal right ureteral stone, interval migration of the right lower pole stone medially into the right renal pelvis, and a  possible third right lower pole stone.  She is comfortable and UA is notable only for microscopic hematuria, low suspicion for UTI at this time.   We discussed various treatment options for her stones including trial of passage vs. ESWL vs. ureteroscopy with laser lithotripsy and stent. We discussed the risks and benefits of all procedures including bleeding, infection, and damage to surrounding structures.   We specifically discussed that ESWL is a less invasive procedure that requires less anesthesia, however patients have to pass their residual stone fragments, which may take several weeks and be associated with continued renal colic. Additionally, we discussed the limitations of ESWL including the ability to only treat one stone in a single treatment (in this case, her proximal right ureteral stone) and the low, 10-20% chance of treatment failure requiring repeat ESWL versus ureteroscopy in the future. By comparison, ureteroscopy is a more invasive surgical procedure that requires more anesthesia, but we can potentially treat multiple stones in a single procedure. However, URS does require placement of a ureteral stent, which will remain in place for approximately 3-10 days and can be associated with flank pain, bladder pain, dysuria, urgency, frequency, urinary leakage, and gross hematuria.  We specifically discussed her frequency of stone episodes and that ureteroscopy would have the advantage of being able to clear her right-sided stone burden.   Based on this conversation, she is leaning toward ureteroscopy, but would like to contact her insurance company to discuss relative coverage/cost of ESWL versus URS.   She already has Zofran and pain medication at home for symptom control.  She will contact me when she decides how she would like to proceed.  We discussed return precautions including fever, uncontrolled pain, and uncontrolled nausea/vomiting. - Urinalysis, Complete - CULTURE, URINE  COMPREHENSIVE    Return for Patient to call to schedule desired procedure.   Carman Ching, PA-C   [facility]  [address], [address] [place], [place] [address] [phone]

## 2022-12-25 NOTE — Discharge Instructions (Addendum)
You have a ureteral stent in place.  This is a tube that extends from your kidney to your bladder.  This may cause urinary bleeding, burning with urination, and urinary frequency.  Please call our office or present to the ED if you develop fevers >101 or pain which is not able to be controlled with oral pain medications.  You may be given either Flomax and/ or ditropan to help with bladder spasms and stent pain in addition to pain medications.    You have a stent in place.  It is on a string taped to your left inner thigh.  Like you to try to leave this an entire week.  If it is dislodged prematurely, it is not an emergency.  Ideally, try to keep it in place as long as possible through Monday.  On the day of removal, untaped the Tegaderm and pull the string gently until the entire stent is removed.  If you have any difficulty, please call our office.  [facility] [address], [address] [place], [place] [address] [phone]     AMBULATORY SURGERY  DISCHARGE INSTRUCTIONS   The drugs that you were given will stay in your system until tomorrow so for the next 24 hours you should not:  Drive an automobile Make any legal decisions Drink any alcoholic beverage   You may resume regular meals tomorrow.  Today it is better to start with liquids and gradually work up to solid foods.  You may eat anything you prefer, but it is better to start with liquids, then soup and crackers, and gradually work up to solid foods.   Please notify your doctor immediately if you have any unusual bleeding, trouble breathing, redness and pain at the surgery site, drainage, fever, or pain not relieved by medication.    Additional Instructions:        Please contact your physician with any problems or Same Day Surgery at [phone], Monday through Friday 6 am to 4 pm, or [place] at [facility] number at [phone].

## 2022-12-26 ENCOUNTER — Encounter: Payer: Self-pay | Admitting: Urology

## 2022-12-26 ENCOUNTER — Other Ambulatory Visit: Payer: Self-pay

## 2022-12-26 DIAGNOSIS — N201 Calculus of ureter: Secondary | ICD-10-CM

## 2022-12-27 ENCOUNTER — Telehealth: Payer: Self-pay

## 2022-12-27 NOTE — Telephone Encounter (Signed)
Pt called triage line stating her stent, that she got on Monday, was causing her discomfort and bleeding. Pt asked if she could pull stent out earlier than coming up Monday. Pt was informed that if she pulled stent out earlier than advised by Dr.Brandon, it can cause issues with healing and with the stone passing through. Pt was informed that discomfort and bleeding are common side effect of having stent in, advised pt to take tylenol. Pt was informed of her 6 week follow up with [facility] prior. Pt voiced understanding.

## 2022-12-29 NOTE — Telephone Encounter (Signed)
Pt calling this morning, stating that she pulled her stent and now she's in excruciating pain. I advised pt to start the oxybutynin and the flomax to see if that helps. I also reiterated the message below, pt stated she was so uncomfortable and just pulled the stent. Please advise any further instructions.

## 2023-01-01 NOTE — Telephone Encounter (Signed)
Left message to call back or send mychart message to let us know how she is feeling

## 2023-01-01 NOTE — Telephone Encounter (Signed)
Patient called back and left message stating that she is doing much better, no issues today. She appreciates our call.

## 2023-01-01 NOTE — Telephone Encounter (Signed)
Could you please follow-up with this patient today make sure she is doing okay?  Caitlin Scotland, MD

## 2023-01-02 LAB — CALCULI, WITH PHOTOGRAPH (CLINICAL LAB)
Calcium Oxalate Dihydrate: 20 %
Calcium Oxalate Monohydrate: 75 %
Hydroxyapatite: 5 %
Weight Calculi: 36 mg

## 2023-01-08 ENCOUNTER — Ambulatory Visit (HOSPITAL_BASED_OUTPATIENT_CLINIC_OR_DEPARTMENT_OTHER)
Admission: RE | Admit: 2023-01-08 | Discharge: 2023-01-08 | Disposition: A | Discharge status: Home / Self Care | Payer: BC Managed Care – PPO | Source: Ambulatory Visit | Attending: Urology | Admitting: Urology

## 2023-01-08 DIAGNOSIS — N201 Calculus of ureter: Secondary | ICD-10-CM | POA: Insufficient documentation

## 2023-01-08 DIAGNOSIS — N200 Calculus of kidney: Secondary | ICD-10-CM | POA: Diagnosis not present

## 2023-01-24 ENCOUNTER — Encounter: Payer: Self-pay | Admitting: Neurology

## 2023-02-06 ENCOUNTER — Ambulatory Visit: Payer: BC Managed Care – PPO | Admitting: Urology

## 2023-02-06 ENCOUNTER — Encounter: Payer: Self-pay | Admitting: Urology

## 2023-02-06 VITALS — BP 124/79 | HR 69

## 2023-02-06 DIAGNOSIS — Z09 Encounter for follow-up examination after completed treatment for conditions other than malignant neoplasm: Secondary | ICD-10-CM

## 2023-02-06 DIAGNOSIS — N201 Calculus of ureter: Secondary | ICD-10-CM

## 2023-02-06 DIAGNOSIS — Z87442 Personal history of urinary calculi: Secondary | ICD-10-CM

## 2023-02-06 NOTE — Progress Notes (Signed)
Marcelle Overlie Plume,acting as a scribe for Vanna Scotland, MD.,have documented all relevant documentation on the behalf of Vanna Scotland, MD,as directed by  Vanna Scotland, MD while in the presence of Vanna Scotland, MD.  02/06/23 4:18 PM   Caitlin Moran March 03, 1961 [id number]  Referring provider: Merri Brunette, MD [address] [place],  [place] [address]  Chief Complaint  Patient presents with   Follow-up    HPI: 62 year-old female with microscopic hematuria and nephrolithiasis.   Most recently, she underwent right ureteroscopy for a 6 mm proximal ureteral stone. Interoperatively, she did have a fairly narrow UPJ but ultimately, a successful procedure. She pulled her stent on a tether.   She returns today with a renal ultrasound that was completed on 01/08/2023. She has no hydronephrosis. I am personally reviewing this study now. They do note some calcifications in the right kidney. I suspect this is likely fragments.   She experienced significant pain after stent removal but reported feeling fine a few days later. She has been asymptomatic since the procedure. She has been following a diet plan requiring eight bottles of water a day. She has been consuming citric acid through tangerines and avoiding tea.    PMH: Past Medical History:  Diagnosis Date   Anxiety    Arthritis of neck    Depression    GERD (gastroesophageal reflux disease)    Headache    History of kidney stones    a.) s/p multiple ESWLs in the past   Hyperlipidemia    IBS (irritable bowel syndrome)    OSA on CPAP    Polycythemia vera (HCC)    ruled out   PONV (postoperative nausea and vomiting)    Didn't go to sleep all the way  during colonoscopy.   Psoriatic arthritis (HCC)    Spinal stenosis     Surgical History: Past Surgical History:  Procedure Laterality Date   ABLATION     unquestionable   BIOPSY  05/01/2018   Procedure: BIOPSY;  Surgeon: Meridee Score Netty Starring., MD;   Location: [facility] ENDOSCOPY;  Service: Gastroenterology;;   BIOPSY  05/24/2020   Procedure: BIOPSY;  Surgeon: Lemar Lofty., MD;  Location: [facility] ENDOSCOPY;  Service: Gastroenterology;;   CESAREAN SECTION     CHOLECYSTECTOMY     COLONOSCOPY     COLONOSCOPY WITH PROPOFOL N/A 05/24/2020   Procedure: COLONOSCOPY WITH PROPOFOL;  Surgeon: Lemar Lofty., MD;  Location: [facility] ENDOSCOPY;  Service: Gastroenterology;  Laterality: N/A;   CYSTOSCOPY W/ RETROGRADES Right 12/25/2022   Procedure: CYSTOSCOPY WITH RETROGRADE PYELOGRAM;  Surgeon: Vanna Scotland, MD;  Location: [facility] ORS;  Service: Urology;  Laterality: Right;   CYSTOSCOPY/URETEROSCOPY/HOLMIUM LASER/STENT PLACEMENT Right 12/25/2022   Procedure: CYSTOSCOPY/URETEROSCOPY/HOLMIUM LASER/STENT PLACEMENT;  Surgeon: Vanna Scotland, MD;  Location: [facility] ORS;  Service: Urology;  Laterality: Right;   ESOPHAGOGASTRODUODENOSCOPY     ESOPHAGOGASTRODUODENOSCOPY N/A 05/01/2018   Procedure: ESOPHAGOGASTRODUODENOSCOPY (EGD);  Surgeon: Lemar Lofty., MD;  Location: [facility] ENDOSCOPY;  Service: Gastroenterology;  Laterality: N/A;   ESOPHAGOGASTRODUODENOSCOPY (EGD) WITH PROPOFOL N/A 05/24/2020   Procedure: ESOPHAGOGASTRODUODENOSCOPY (EGD) WITH PROPOFOL;  Surgeon: Meridee Score Netty Starring., MD;  Location: [facility] ENDOSCOPY;  Service: Gastroenterology;  Laterality: N/A;   EUS N/A 05/01/2018   Procedure: UPPER ENDOSCOPIC ULTRASOUND (EUS) RADIAL;  Surgeon: Lemar Lofty., MD;  Location: [facility] ENDOSCOPY;  Service: Gastroenterology;  Laterality: N/A;   EXTRACORPOREAL SHOCK WAVE LITHOTRIPSY  02/14/2010   EXTRACORPOREAL SHOCK WAVE LITHOTRIPSY Right 03/30/2022   Procedure: EXTRACORPOREAL SHOCK WAVE LITHOTRIPSY (ESWL);  Surgeon: Riki Altes, MD;  Location: [facility] ORS;  Service: Urology;  Laterality: Right;   HYSTEROSCOPY WITH D & C N/A 11/27/2012   Procedure: DILATATION AND CURETTAGE /HYSTEROSCOPY;  Surgeon: Zelphia Cairo, MD;  Location: [facility] ORS;  Service:  Gynecology;  Laterality: N/A;   SUBMUCOSAL LIFTING INJECTION  05/24/2020   Procedure: SUBMUCOSAL LIFTING INJECTION;  Surgeon: Meridee Score Netty Starring., MD;  Location: [facility] ENDOSCOPY;  Service: Gastroenterology;;   vericose veins     WISDOM TOOTH EXTRACTION      Home Medications:  Allergies as of 02/06/2023   No Known Allergies      Medication List        Accurate as of February 06, 2023  4:18 PM. If you have any questions, ask your nurse or doctor.          aspirin EC 81 MG tablet Take 81 mg by mouth daily.   Calcium-Vitamin D 600-400 MG-UNIT Tabs Take 1 tablet by mouth daily.   celecoxib 200 MG capsule Commonly known as: CELEBREX Take 200 mg by mouth daily.   clobetasol cream 0.05 % Commonly known as: TEMOVATE Apply 1 application topically daily as needed (for psoriasis).   clonazePAM 0.5 MG tablet Commonly known as: KLONOPIN 1 tablet Orally Once a day/PRN   Fish Oil 1000 MG Caps 1 capsule Orally Once a day   Golimumab 50 MG/0.5ML Soaj Inject 50 mg into the skin every 30 (thirty) days.   HYDROcodone-acetaminophen 7.5-325 MG tablet Commonly known as: NORCO Take 1 tablet by mouth every 6 (six) hours as needed for moderate pain.   HYDROcodone-acetaminophen 5-325 MG tablet Commonly known as: NORCO/VICODIN Take 1-2 tablets by mouth every 6 (six) hours as needed for moderate pain.   methocarbamol 500 MG tablet Commonly known as: ROBAXIN TAKE 1 TABLET BY MOUTH EVERY 8 HOURS AS NEEDED FOR SPASM   Mounjaro 15 MG/0.5ML Pen Generic drug: tirzepatide Inject 15 mg into the skin once a week.   Multivitamin Adult Tabs Orally   omeprazole 40 MG capsule Commonly known as: PRILOSEC Take 1 capsule (40 mg total) by mouth 2 (two) times daily before a meal.   ondansetron 4 MG disintegrating tablet Commonly known as: ZOFRAN-ODT Take 1 tablet (4 mg total) by mouth every 8 (eight) hours as needed for nausea or vomiting.   oxybutynin 5 MG tablet Commonly known as:  DITROPAN Take 1 tablet (5 mg total) by mouth every 8 (eight) hours as needed for bladder spasms.   predniSONE 5 MG (48) Tbpk tablet Commonly known as: STERAPRED UNI-PAK 48 TAB as directed Orally 12 day dose pack for 12 days   PROBIOTIC PO Take 1 capsule by mouth daily.   rosuvastatin 40 MG tablet Commonly known as: CRESTOR Take 40 mg by mouth daily.   tamsulosin 0.4 MG Caps capsule Commonly known as: FLOMAX Take 1 capsule (0.4 mg total) by mouth daily.   tamsulosin 0.4 MG Caps capsule Commonly known as: Flomax Take 1 capsule (0.4 mg total) by mouth daily.   tiZANidine 2 MG tablet Commonly known as: ZANAFLEX Take 2 mg by mouth 3 (three) times daily as needed.   Turmeric Curcumin 500 MG Caps Take 1 capsule by mouth daily.   Vitamin D 50 MCG (2000 UT) Caps Take 2,000 Units by mouth daily.   vortioxetine HBr 20 MG Tabs tablet Commonly known as: TRINTELLIX Take 20 mg by mouth daily.        Family History: Family History  Problem Relation Age of Onset  Breast cancer Maternal Aunt    Esophageal cancer Mother    Lung cancer Father    CAD Father    Colon cancer Neg Hx    Inflammatory bowel disease Neg Hx    Liver disease Neg Hx    Pancreatic cancer Neg Hx    Stomach cancer Neg Hx    Rectal cancer Neg Hx     Social History:  reports that she quit smoking about 32 years ago. Her smoking use included cigarettes. She has been exposed to tobacco smoke. She has never used smokeless tobacco. She reports current alcohol use of about 7.0 standard drinks of alcohol per week. She reports that she does not use drugs.   Physical Exam: BP 124/79   Pulse 69   LMP 09/24/2012   Constitutional:  Alert and oriented, No acute distress. HEENT: [place] AT, moist mucus membranes.  Trachea midline, no masses. Neurologic: Grossly intact, no focal deficits, moving all 4 extremities. Psychiatric: Normal mood and affect.   Pertinent Imaging: EXAM: RENAL / URINARY TRACT ULTRASOUND  COMPLETE  COMPARISON:  Abdominal radiograph 08/03/2022  FINDINGS: Right Kidney:  Renal measurements: 9.9 x 5.6 x 4.8 cm = volume: 140.9 mL. Echogenicity within normal limits. No mass or hydronephrosis visualized. There is an 11 mm stone within the right kidney.  Left Kidney:  Renal measurements: 10.0 x 6.2 x 4.5 cm = volume: 143.7 mL. Echogenicity within normal limits. No mass or hydronephrosis visualized. There is a 6 mm stone within the left kidney.  Bladder:  Appears normal for degree of bladder distention.  Other:  None.  IMPRESSION: Bilateral nephrolithiasis. No hydronephrosis.   Electronically Signed By: Annia Belt M.D. On: 01/08/2023 21:45  This was personally reviewed and I agree with the radiologic interpretation.   Assessment & Plan:    1. Nephrolithiasis -Status post ureteroscopy, follow-up renal ultrasound without any appreciable hydronephrosis, likely with some residual stone dust material/fragments Continue to encourage increased fluid intake to dilute urine and prevent stone formation. - We discussed general stone prevention techniques including drinking plenty water with goal of producing 2.5 L urine daily, increased citric acid intake, avoidance of high oxalate containing foods, and decreased salt intake.  Information about dietary recommendations given today.   Return in about 1 year (around 02/06/2024) for repeat KUB to monitor for new stone formation.   [facility] [address] [place], [place] [address] [phone]

## 2023-02-14 DIAGNOSIS — D225 Melanocytic nevi of trunk: Secondary | ICD-10-CM | POA: Diagnosis not present

## 2023-02-14 DIAGNOSIS — D2261 Melanocytic nevi of right upper limb, including shoulder: Secondary | ICD-10-CM | POA: Diagnosis not present

## 2023-02-14 DIAGNOSIS — L814 Other melanin hyperpigmentation: Secondary | ICD-10-CM | POA: Diagnosis not present

## 2023-02-14 DIAGNOSIS — D2262 Melanocytic nevi of left upper limb, including shoulder: Secondary | ICD-10-CM | POA: Diagnosis not present

## 2023-02-19 DIAGNOSIS — M5126 Other intervertebral disc displacement, lumbar region: Secondary | ICD-10-CM | POA: Diagnosis not present

## 2023-02-19 DIAGNOSIS — M5030 Other cervical disc degeneration, unspecified cervical region: Secondary | ICD-10-CM | POA: Diagnosis not present

## 2023-02-19 DIAGNOSIS — M5416 Radiculopathy, lumbar region: Secondary | ICD-10-CM | POA: Diagnosis not present

## 2023-02-20 DIAGNOSIS — G4733 Obstructive sleep apnea (adult) (pediatric): Secondary | ICD-10-CM | POA: Diagnosis not present

## 2023-02-22 DIAGNOSIS — Z0001 Encounter for general adult medical examination with abnormal findings: Secondary | ICD-10-CM | POA: Diagnosis not present

## 2023-02-26 DIAGNOSIS — Z0000 Encounter for general adult medical examination without abnormal findings: Secondary | ICD-10-CM | POA: Diagnosis not present

## 2023-02-26 DIAGNOSIS — E559 Vitamin D deficiency, unspecified: Secondary | ICD-10-CM | POA: Diagnosis not present

## 2023-02-26 DIAGNOSIS — F339 Major depressive disorder, recurrent, unspecified: Secondary | ICD-10-CM | POA: Diagnosis not present

## 2023-02-26 DIAGNOSIS — L4050 Arthropathic psoriasis, unspecified: Secondary | ICD-10-CM | POA: Diagnosis not present

## 2023-02-26 DIAGNOSIS — E785 Hyperlipidemia, unspecified: Secondary | ICD-10-CM | POA: Diagnosis not present

## 2023-02-26 DIAGNOSIS — Z23 Encounter for immunization: Secondary | ICD-10-CM | POA: Diagnosis not present

## 2023-05-09 ENCOUNTER — Ambulatory Visit: Payer: BC Managed Care – PPO | Admitting: Podiatry

## 2023-05-10 ENCOUNTER — Ambulatory Visit: Payer: BC Managed Care – PPO | Admitting: Podiatry

## 2023-05-16 ENCOUNTER — Ambulatory Visit: Payer: BC Managed Care – PPO | Admitting: Podiatry

## 2023-05-16 ENCOUNTER — Encounter: Payer: Self-pay | Admitting: Podiatry

## 2023-05-16 VITALS — Ht 67.0 in | Wt 208.0 lb

## 2023-05-16 DIAGNOSIS — B070 Plantar wart: Secondary | ICD-10-CM

## 2023-05-16 MED ORDER — FLUOROURACIL 5 % EX CREA: TOPICAL_CREAM | Freq: Two times a day (BID) | CUTANEOUS | 2 refills | Status: DC

## 2023-05-17 NOTE — Progress Notes (Signed)
 Subjective:   Patient ID: Caitlin Moran, female   DOB: 63 y.o.   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

## 2023-05-22 ENCOUNTER — Telehealth: Payer: Self-pay | Admitting: Podiatry

## 2023-05-22 NOTE — Telephone Encounter (Signed)
 Patient saw Dr. Charlsie Merles last week.  Was given a cream.  When she uses the cream she has an agonizing pain.  Cannot walk on her foot.

## 2023-05-23 NOTE — Progress Notes (Signed)
 PATIENT: Caitlin Moran DOB: 1961-02-02  REASON FOR VISIT: follow up HISTORY FROM: patient  Chief Complaint  Patient presents with   Obstructive Sleep Apnea    Rm1, alone, WUJ:[id number] to wear cpap greater than 4 hours nightly, ess score of 7      HISTORY OF PRESENT ILLNESS:  05/28/23 ALL:  Caitlin Moran is a 63 y.o. female here today for follow up for OSA on BiPAP. She was switched from autoPAP to BiPAP 06/2022. Since, she reports doing very well. She does admit to taking her mask off at night sometimes without knowledge. Otherwise, tolerating it well. She is sleeping well. She denies concerns with machine or supplies. She has lost about 50 pounds.     10/16/2022 CD (Mychart): [name] has been established patient in our clinic and carries a diagnosis of complex sleep apnea CPAP did insufficiently addressed those issues and she was therefore switched to BiPAP.  She has been using BiPAP since May 2020 for now and has been highly compliant even not able to get a baseline home sleep test ordered as her insurance has a remarkable silly clause that does not allow coverage of sleep studies.!   The patient has been using the machine 100% of days and 87% by time with an average of 5 hours 4 minutes, she is using an auto BiPAP with a 4 cm pressure spread allowing pressures between 10/6 and 20 over 16 cm water.  Her residual is an AHI of 5.8/h and her 95th percentile is C 12/8 setting.  There are some residual obstructive apnea still present but very few centrals.  Airleak on her fullface mask is actually better controlled than in the past.  I had sent her to a durable medical equipment company for a new BiPAP machine as well as for a fitting of a not full facemask, but the respiratory tech at the DME told her that he would not recommend to switch.  This is a patient is retrognathia, btw.  Her baseline health has changed : the patient lost 40 pounds over  a period of just over 6 months on  Munjaro. Current weight is followed by Dr Caitlin Moran, she is enrolled in a clinical trial through his office, weighs less than 190 pounds now.  Has been doing well on BiPAP and is compliant. Residual AHI 5.8/h , DME recommended to stay on FFM not to change in spite of  overbite/ retrognathia.    I will briefly contact them and ask them to allow her a nasal interface   01/24/22 ALL: Caitlin Moran is a 63 y.o. female here today for follow up for OSA on CPAP. She was last seen 07/2021. Daily compliance was acceptable but 4 hour compliance remained low. She has improved compliance and now using CPAP most every night for 5-6 hours. Previous reports show AHI has always been well managed at less than 4 events per hour, however, most recent download shows AHI 22/h. Pressure in the 95th percentile of 15.9 with autoPAP setting of 6-16cmH20. Previous pressures have been around 12. NO significant changes in medical history or weight. She is doing well and tolerating therapy. She reports feeling a little better overall.         REVIEW OF SYSTEMS: Out of a complete 14 system review of symptoms, the patient complains only of the following symptoms, none and all other reviewed systems are negative.  ESS: 7/24  ALLERGIES: No Known Allergies  HOME MEDICATIONS: Outpatient Medications  Prior to Visit  Medication Sig Dispense Refill   aspirin EC 81 MG tablet Take 81 mg by mouth daily.     Calcium Carb-Cholecalciferol (CALCIUM-VITAMIN D) 600-400 MG-UNIT TABS Take 1 tablet by mouth daily.     celecoxib (CELEBREX) 200 MG capsule Take 200 mg by mouth daily.      Cholecalciferol (VITAMIN D) 50 MCG (2000 UT) CAPS Take 2,000 Units by mouth daily.      clobetasol cream (TEMOVATE) 0.05 % Apply 1 application topically daily as needed (for psoriasis).      clonazePAM (KLONOPIN) 0.5 MG tablet 1 tablet Orally Once a day/PRN     Golimumab 50 MG/0.5ML SOAJ Inject 50 mg into the skin every 30 (thirty) days.       HYDROcodone-acetaminophen (NORCO) 7.5-325 MG tablet Take 1 tablet by mouth every 6 (six) hours as needed for moderate pain.  0   HYDROcodone-acetaminophen (NORCO/VICODIN) 5-325 MG tablet Take 1-2 tablets by mouth every 6 (six) hours as needed for moderate pain. 10 tablet 0   methocarbamol (ROBAXIN) 500 MG tablet TAKE 1 TABLET BY MOUTH EVERY 8 HOURS AS NEEDED FOR SPASM     Multiple Vitamin (MULTIVITAMIN ADULT) TABS Orally     omeprazole (PRILOSEC) 40 MG capsule Take 1 capsule (40 mg total) by mouth 2 (two) times daily before a meal. 60 capsule 0   oxybutynin (DITROPAN) 5 MG tablet Take 1 tablet (5 mg total) by mouth every 8 (eight) hours as needed for bladder spasms. 30 tablet 0   Probiotic Product (PROBIOTIC PO) Take 1 capsule by mouth daily.     rosuvastatin (CRESTOR) 40 MG tablet Take 40 mg by mouth daily.     tamsulosin (FLOMAX) 0.4 MG CAPS capsule Take 1 capsule (0.4 mg total) by mouth daily. 30 capsule 3   tamsulosin (FLOMAX) 0.4 MG CAPS capsule Take 1 capsule (0.4 mg total) by mouth daily. 30 capsule 0   tirzepatide (MOUNJARO) 15 MG/0.5ML Pen Inject 15 mg into the skin once a week.     Turmeric Curcumin 500 MG CAPS Take 1 capsule by mouth daily.     vortioxetine HBr (TRINTELLIX) 20 MG TABS tablet Take 20 mg by mouth daily.     fluorouracil (EFUDEX) 5 % cream Apply topically 2 (two) times daily. 40 g 2   Omega-3 Fatty Acids (FISH OIL) 1000 MG CAPS 1 capsule Orally Once a day     ondansetron (ZOFRAN-ODT) 4 MG disintegrating tablet Take 1 tablet (4 mg total) by mouth every 8 (eight) hours as needed for nausea or vomiting. 20 tablet 0   predniSONE (STERAPRED UNI-PAK 48 TAB) 5 MG (48) TBPK tablet as directed Orally 12 day dose pack for 12 days     tiZANidine (ZANAFLEX) 2 MG tablet Take 2 mg by mouth 3 (three) times daily as needed.     No facility-administered medications prior to visit.    PAST MEDICAL HISTORY: Past Medical History:  Diagnosis Date   Anxiety    Arthritis of neck     Depression    GERD (gastroesophageal reflux disease)    Headache    History of kidney stones    a.) s/p multiple ESWLs in the past   Hyperlipidemia    IBS (irritable bowel syndrome)    OSA on CPAP    Polycythemia vera (HCC)    ruled out   PONV (postoperative nausea and vomiting)    Didn't go to sleep all the way  during colonoscopy.   Psoriatic arthritis (HCC)  Spinal stenosis     PAST SURGICAL HISTORY: Past Surgical History:  Procedure Laterality Date   ABLATION     unquestionable   BIOPSY  05/01/2018   Procedure: BIOPSY;  Surgeon: Meridee Score Netty Starring., MD;  Location: [facility] ENDOSCOPY;  Service: Gastroenterology;;   BIOPSY  05/24/2020   Procedure: BIOPSY;  Surgeon: Lemar Lofty., MD;  Location: [facility] ENDOSCOPY;  Service: Gastroenterology;;   CESAREAN SECTION     CHOLECYSTECTOMY     COLONOSCOPY     COLONOSCOPY WITH PROPOFOL N/A 05/24/2020   Procedure: COLONOSCOPY WITH PROPOFOL;  Surgeon: Lemar Lofty., MD;  Location: [facility] ENDOSCOPY;  Service: Gastroenterology;  Laterality: N/A;   CYSTOSCOPY W/ RETROGRADES Right 12/25/2022   Procedure: CYSTOSCOPY WITH RETROGRADE PYELOGRAM;  Surgeon: Vanna Scotland, MD;  Location: [facility] ORS;  Service: Urology;  Laterality: Right;   CYSTOSCOPY/URETEROSCOPY/HOLMIUM LASER/STENT PLACEMENT Right 12/25/2022   Procedure: CYSTOSCOPY/URETEROSCOPY/HOLMIUM LASER/STENT PLACEMENT;  Surgeon: Vanna Scotland, MD;  Location: [facility] ORS;  Service: Urology;  Laterality: Right;   ESOPHAGOGASTRODUODENOSCOPY     ESOPHAGOGASTRODUODENOSCOPY N/A 05/01/2018   Procedure: ESOPHAGOGASTRODUODENOSCOPY (EGD);  Surgeon: Lemar Lofty., MD;  Location: [facility] ENDOSCOPY;  Service: Gastroenterology;  Laterality: N/A;   ESOPHAGOGASTRODUODENOSCOPY (EGD) WITH PROPOFOL N/A 05/24/2020   Procedure: ESOPHAGOGASTRODUODENOSCOPY (EGD) WITH PROPOFOL;  Surgeon: Meridee Score Netty Starring., MD;  Location: [facility] ENDOSCOPY;  Service: Gastroenterology;  Laterality: N/A;   EUS N/A  05/01/2018   Procedure: UPPER ENDOSCOPIC ULTRASOUND (EUS) RADIAL;  Surgeon: Lemar Lofty., MD;  Location: [facility] ENDOSCOPY;  Service: Gastroenterology;  Laterality: N/A;   EXTRACORPOREAL SHOCK WAVE LITHOTRIPSY  02/14/2010   EXTRACORPOREAL SHOCK WAVE LITHOTRIPSY Right 03/30/2022   Procedure: EXTRACORPOREAL SHOCK WAVE LITHOTRIPSY (ESWL);  Surgeon: Riki Altes, MD;  Location: [facility] ORS;  Service: Urology;  Laterality: Right;   HYSTEROSCOPY WITH D & C N/A 11/27/2012   Procedure: DILATATION AND CURETTAGE /HYSTEROSCOPY;  Surgeon: Zelphia Cairo, MD;  Location: [facility] ORS;  Service: Gynecology;  Laterality: N/A;   SUBMUCOSAL LIFTING INJECTION  05/24/2020   Procedure: SUBMUCOSAL LIFTING INJECTION;  Surgeon: Meridee Score Netty Starring., MD;  Location: [facility] ENDOSCOPY;  Service: Gastroenterology;;   vericose veins     WISDOM TOOTH EXTRACTION      FAMILY HISTORY: Family History  Problem Relation Age of Onset   Breast cancer Maternal Aunt    Esophageal cancer Mother    Lung cancer Father    CAD Father    Colon cancer Neg Hx    Inflammatory bowel disease Neg Hx    Liver disease Neg Hx    Pancreatic cancer Neg Hx    Stomach cancer Neg Hx    Rectal cancer Neg Hx     SOCIAL HISTORY: Social History   Socioeconomic History   Marital status: Married    Spouse name: Not on file   Number of children: 2   Years of education: Not on file   Highest education level: Not on file  Occupational History   Occupation: RN  Tobacco Use   Smoking status: Former    Current packs/day: 0.00    Types: Cigarettes    Quit date: 11/05/1990    Years since quitting: 32.5    Passive exposure: Past   Smokeless tobacco: Never  Vaping Use   Vaping status: Never Used  Substance and Sexual Activity   Alcohol use: Yes    Alcohol/week: 7.0 standard drinks of alcohol    Types: 7 Glasses of wine per week    Comment: 5   Drug use: No   Sexual activity:  Not on file  Other Topics Concern   Not on file  Social  History Narrative   Not on file   Social Drivers of Health   Financial Resource Strain: Not on file  Food Insecurity: Not on file  Transportation Needs: Not on file  Physical Activity: Not on file  Stress: Not on file  Social Connections: Not on file  Intimate Partner Violence: Not on file     PHYSICAL EXAM  Vitals:   05/28/23 1253  BP: (!) 144/78  Pulse: 63  Resp: 15  Weight: 193 lb 8 oz (87.8 kg)  Height: 5' 6.5" (1.689 m)   Body mass index is 30.76 kg/m.  Generalized: Well developed, in no acute distress  Cardiology: normal rate and rhythm, no murmur noted Respiratory: clear to auscultation bilaterally  Neurological examination  Mentation: Alert oriented to time, place, history taking. Follows all commands speech and language fluent Cranial nerve II-XII: Pupils were equal round reactive to light. Extraocular movements were full, visual field were full  Motor: The motor testing reveals 5 over 5 strength of all 4 extremities. Good symmetric motor tone is noted throughout.  Gait and station: Gait is normal.    DIAGNOSTIC DATA (LABS, IMAGING, TESTING) - I reviewed patient records, labs, notes, testing and imaging myself where available.      No data to display           Lab Results  Component Value Date   WBC 7.4 05/17/2020   HGB 15.4 (H) 05/17/2020   HCT 45.1 05/17/2020   MCV 89.2 05/17/2020   PLT 252.0 05/17/2020      Component Value Date/Time   NA 141 05/17/2020 1432   K 4.2 05/17/2020 1432   CL 104 05/17/2020 1432   CO2 31 05/17/2020 1432   GLUCOSE 86 05/17/2020 1432   BUN 21 05/17/2020 1432   CREATININE 1.01 05/17/2020 1432   CALCIUM 9.7 05/17/2020 1432   PROT 6.8 05/17/2020 1432   ALBUMIN 4.0 05/17/2020 1432   AST 15 05/17/2020 1432   ALT 18 05/17/2020 1432   ALKPHOS 80 05/17/2020 1432   BILITOT 0.8 05/17/2020 1432   GFRNONAA >60 10/16/2010 1600   GFRAA >60 10/16/2010 1600   No results found for: "CHOL", "HDL", "LDLCALC", "LDLDIRECT",  "TRIG", "CHOLHDL" No results found for: "HGBA1C" No results found for: "VITAMINB12" No results found for: "TSH"   ASSESSMENT AND PLAN 63 y.o. year old female  has a past medical history of Anxiety, Arthritis of neck, Depression, GERD (gastroesophageal reflux disease), Headache, History of kidney stones, Hyperlipidemia, IBS (irritable bowel syndrome), OSA on CPAP, Polycythemia vera (HCC), PONV (postoperative nausea and vomiting), Psoriatic arthritis (HCC), and Spinal stenosis. here with     ICD-10-CM   1. Sleep apnea treated with nocturnal BiPAP  G47.30 For home use only DME Bipap    2. Complex sleep apnea syndrome  G47.39 For home use only DME Bipap       Caitlin Moran is doing well on BiPAP therapy. Compliance report reveals optimal daily but sub optimal four hour compliance. She was encouraged to continue using BiPAP nightly and for greater than 4 hours each night. We will update supply orders as indicated. Risks of untreated sleep apnea review and education materials provided. Healthy lifestyle habits encouraged. She will follow up in 1 year, sooner if needed. She verbalizes understanding and agreement with this plan.    Orders Placed This Encounter  Procedures   For home use only DME Bipap  Heated humidity with all supplies as needed    Length of Need:   Lifetime    Inspiratory pressure:   OTHER SEE COMMENTS    Expiratory pressure:   OTHER SEE COMMENTS     No orders of the defined types were placed in this encounter.     Shawnie Dapper, FNP-C 05/28/2023, 1:13 PM [facility] [address] [place], [place] [address] [phone]

## 2023-05-23 NOTE — Telephone Encounter (Signed)
 Should not use

## 2023-05-23 NOTE — Patient Instructions (Signed)

## 2023-05-24 NOTE — Progress Notes (Signed)
 Caitlin Moran

## 2023-05-28 ENCOUNTER — Ambulatory Visit: Payer: BC Managed Care – PPO | Admitting: Family Medicine

## 2023-05-28 ENCOUNTER — Encounter: Payer: Self-pay | Admitting: Family Medicine

## 2023-05-28 VITALS — BP 144/78 | HR 63 | Resp 15 | Ht 66.5 in | Wt 193.5 lb

## 2023-05-28 DIAGNOSIS — G4730 Sleep apnea, unspecified: Secondary | ICD-10-CM | POA: Diagnosis not present

## 2023-05-28 DIAGNOSIS — G4739 Other sleep apnea: Secondary | ICD-10-CM | POA: Diagnosis not present

## 2023-07-18 IMAGING — CR DG ABDOMEN 1V
1 series · 2 of 2 positions shown · non-contrast
Comparison: CT 10/28/2020

CLINICAL DATA: Nephrolithiasis

EXAM:
ABDOMEN - 1 VIEW

[Series 1: dg abd 1 view · 0.14mm/px · 2 of 2 slices shown]
[im 1/2]
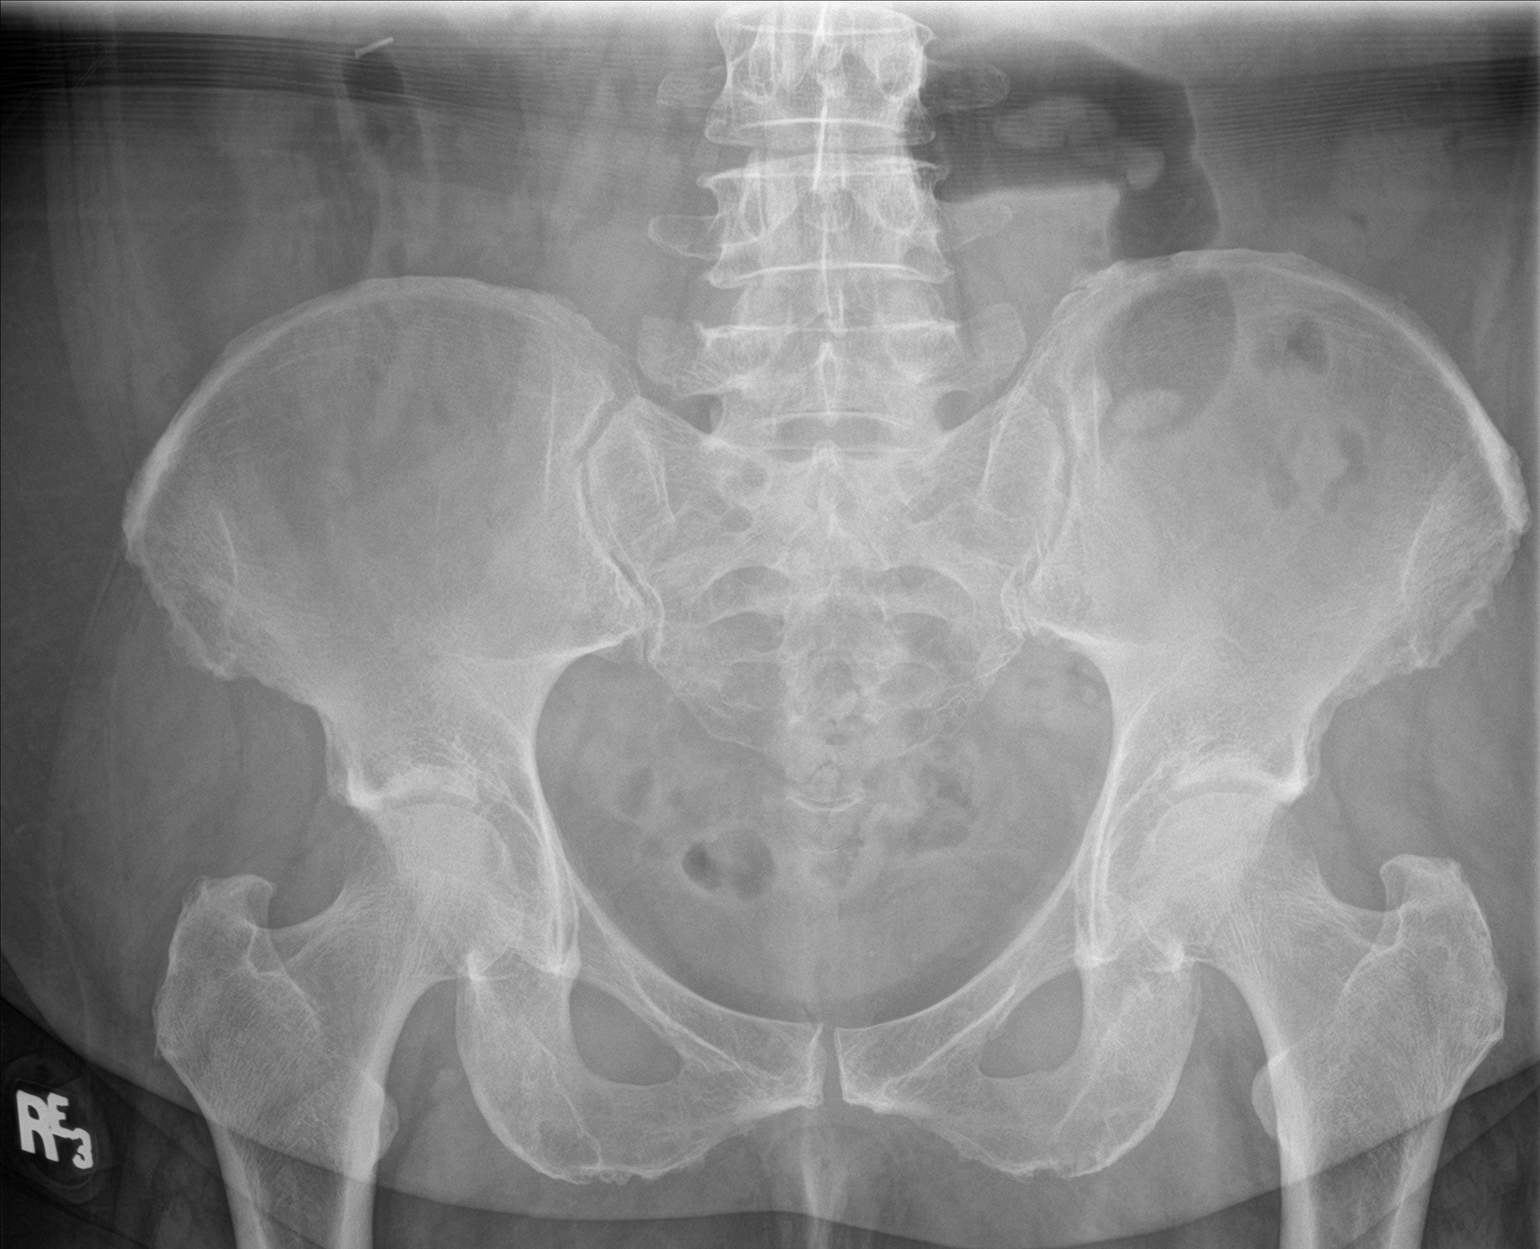
[im 2/2]
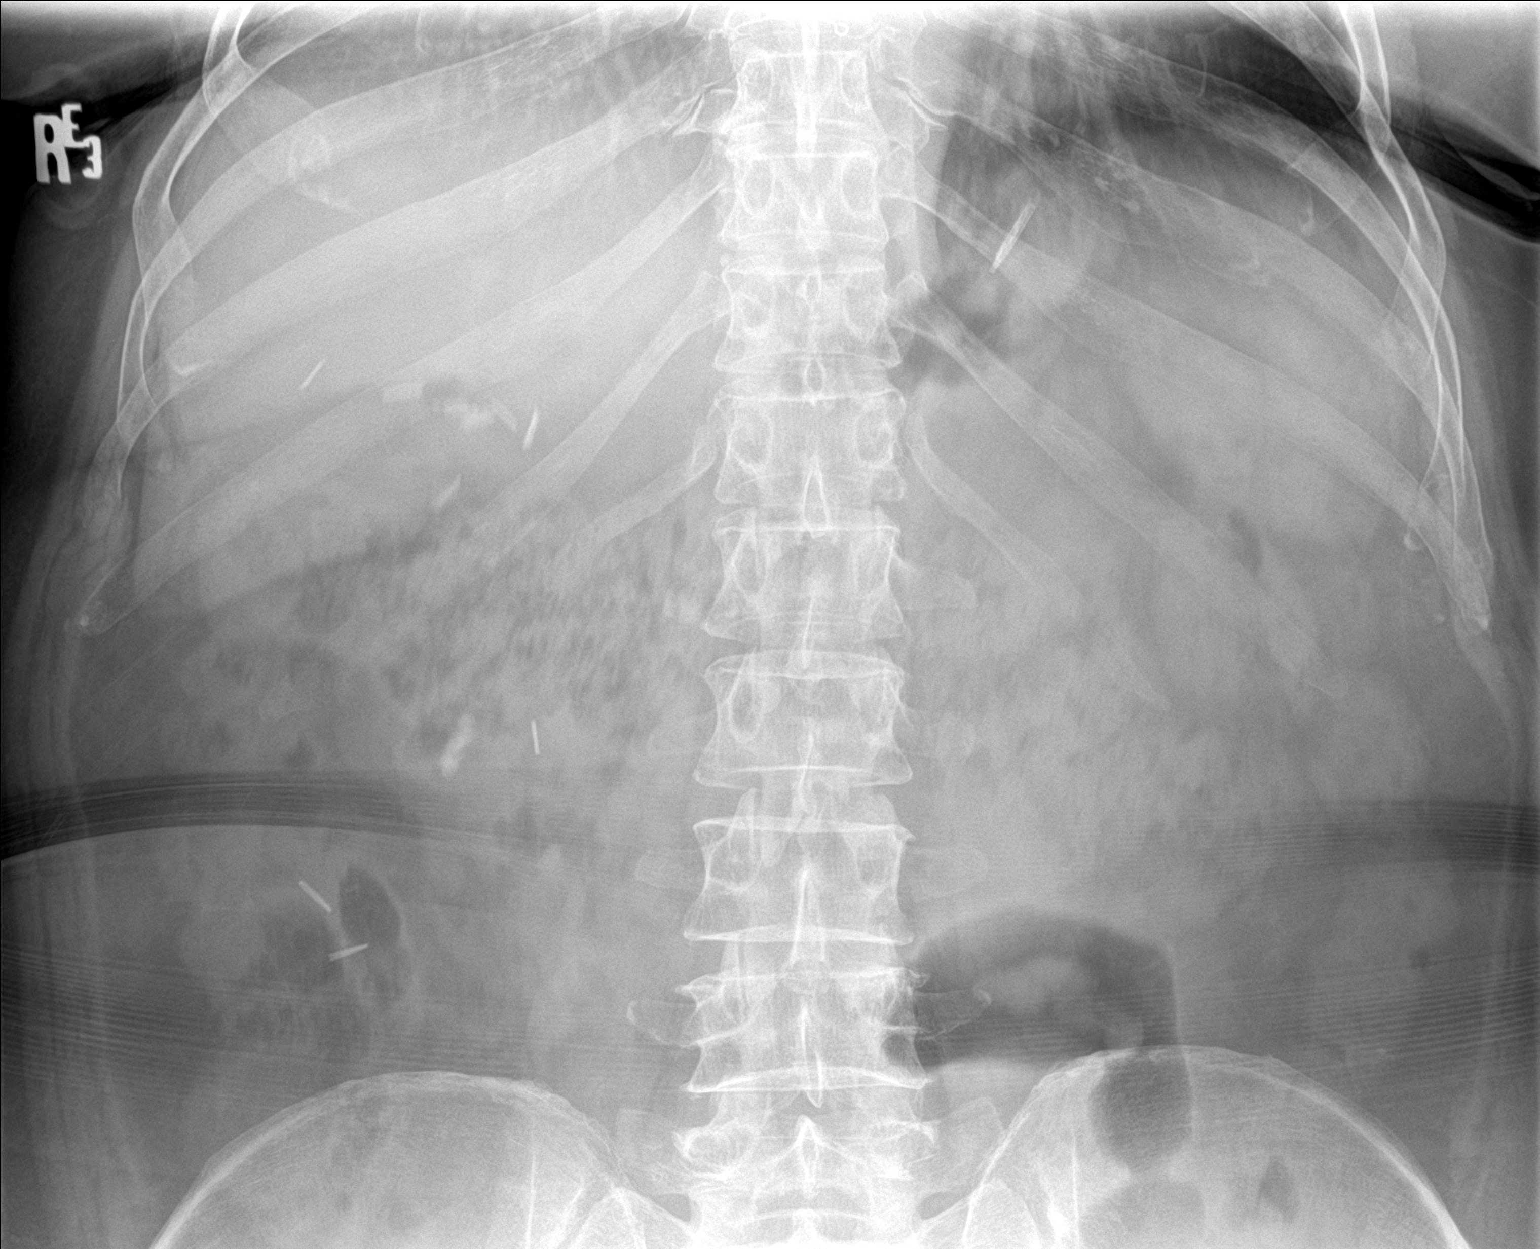

[2 of 2 positions shown; findings below may reference images not displayed]

FINDINGS: Imaging is slightly limited by motion artifact. A ovoid calculus
overlies the lower pole of the right kidney, similar to prior CT
examination measuring roughly 5 mm x 19 mm. No additional renal or
ureteral calculi. Numerous surgical clips are seen within the right
upper quadrant. Endoscopic clip noted within the epigastric region.
Normal abdominal gas pattern. No acute bone abnormality.
IMPRESSION: Stable 19 mm calculus overlying the expected lower pole the right
kidney. No urolithiasis.

## 2023-08-08 ENCOUNTER — Ambulatory Visit: Payer: Self-pay | Admitting: Orthopedic Surgery

## 2023-08-08 DIAGNOSIS — M48062 Spinal stenosis, lumbar region with neurogenic claudication: Secondary | ICD-10-CM

## 2023-09-04 ENCOUNTER — Ambulatory Visit: Payer: Self-pay | Admitting: Orthopedic Surgery

## 2023-09-04 NOTE — H&P (View-Only) (Signed)
 Caitlin Moran is an 63 y.o. female.   Chief Complaint: back and leg pain HPI: Reason for Visit: Diagnositc Results (lumbar MRI) Context: 1.5 years Location (Lower Extremity): lower back pain ; thigh pain bilateral, , ; bilateral hips Severity: pain level 4/10 Aggravating Factors: walking for ; sitting for long periods of time Associated Symptoms: no numbness/tingling; no weakness Medications: Hydrocodone  prescribed by Lauraine Hamlet, NP Pain with standing and sitting. No fevers or chills change in bowel or bladder  Past Medical History:  Diagnosis Date   Anxiety    Arthritis of neck    Depression    GERD (gastroesophageal reflux disease)    Headache    History of kidney stones    a.) s/p multiple ESWLs in the past   Hyperlipidemia    IBS (irritable bowel syndrome)    OSA on CPAP    Polycythemia vera (HCC)    ruled out   PONV (postoperative nausea and vomiting)    Didn't go to sleep all the way  during colonoscopy.   Psoriatic arthritis (HCC)    Spinal stenosis     Past Surgical History:  Procedure Laterality Date   ABLATION     unquestionable   BIOPSY  05/01/2018   Procedure: BIOPSY;  Surgeon: Wilhelmenia Aloha Raddle., MD;  Location: [facility]COPY;  Service: Gastroenterology;;   BIOPSY  05/24/2020   Procedure: BIOPSY;  Surgeon: Wilhelmenia Aloha Raddle., MD;  Location: [facility];  Service: Gastroenterology;;   CESAREAN SECTION     CHOLECYSTECTOMY     COLONOSCOPY     COLONOSCOPY WITH PROPOFOL  N/A 05/24/2020   Procedure: COLONOSCOPY WITH PROPOFOL ;  Surgeon: Wilhelmenia Aloha Raddle., MD;  Location: [facility];  Service: Gastroenterology;  Laterality: N/A;   CYSTOSCOPY W/ RETROGRADES Right 12/25/2022   Procedure: CYSTOSCOPY WITH RETROGRADE PYELOGRAM;  Surgeon: Penne Knee, MD;  Location: [facility] ORS;  Service: Urology;  Laterality: Right;   CYSTOSCOPY/URETEROSCOPY/HOLMIUM LASER/STENT PLACEMENT Right 12/25/2022   Procedure: CYSTOSCOPY/URETEROSCOPY/HOLMIUM LASER/STENT  PLACEMENT;  Surgeon: Penne Knee, MD;  Location: [facility];  Service: Urology;  Laterality: Right;   ESOPHAGOGASTRODUODENOSCOPY     ESOPHAGOGASTRODUODENOSCOPY N/A 05/01/2018   Procedure: ESOPHAGOGASTRODUODENOSCOPY (EGD);  Surgeon: Wilhelmenia Aloha Raddle., MD;  Location: [facility] ENDOSCOPY;  Service: Gastroenterology;  Laterality: N/A;   ESOPHAGOGASTRODUODENOSCOPY (EGD) WITH PROPOFOL  N/A 05/24/2020   Procedure: ESOPHAGOGASTRODUODENOSCOPY (EGD) WITH PROPOFOL ;  Surgeon: Wilhelmenia Aloha Raddle., MD;  Location: [facility] ENDOSCOPY;  Service: Gastroenterology;  Laterality: N/A;   EUS N/A 05/01/2018   Procedure: UPPER ENDOSCOPIC ULTRASOUND (EUS) RADIAL;  Surgeon: Wilhelmenia Aloha Raddle., MD;  Location: [facility] ENDOSCOPY;  Service: Gastroenterology;  Laterality: N/A;   EXTRACORPOREAL SHOCK WAVE LITHOTRIPSY  02/14/2010   EXTRACORPOREAL SHOCK WAVE LITHOTRIPSY Right 03/30/2022   Procedure: EXTRACORPOREAL SHOCK WAVE LITHOTRIPSY (ESWL);  Surgeon: Twylla Glendia BROCKS, MD;  Location: [facility];  Service: Urology;  Laterality: Right;   HYSTEROSCOPY WITH D & C N/A 11/27/2012   Procedure: DILATATION AND CURETTAGE /HYSTEROSCOPY;  Surgeon: Truman Corona, MD;  Location: [facility] ORS;  Service: Gynecology;  Laterality: N/A;   SUBMUCOSAL LIFTING INJECTION  05/24/2020   Procedure: SUBMUCOSAL LIFTING INJECTION;  Surgeon: Wilhelmenia Aloha Raddle., MD;  Location: [facility];  Service: Gastroenterology;;   vericose veins     WISDOM TOOTH EXTRACTION      Family History  Problem Relation Age of Onset   Breast cancer Maternal Aunt    Esophageal cancer Mother    Lung cancer Father    CAD Father    Colon cancer Neg Hx    Inflammatory  bowel disease Neg Hx    Liver disease Neg Hx    Pancreatic cancer Neg Hx    Stomach cancer Neg Hx    Rectal cancer Neg Hx    Social History:  reports that she quit smoking about 32 years ago. Her smoking use included cigarettes. She has been exposed to tobacco smoke. She has never used smokeless tobacco. She  reports current alcohol use of about 7.0 standard drinks of alcohol per week. She reports that she does not use drugs.  Allergies: No Known Allergies  Current meds: aspirin celecoxib 200 mg capsule chlorhexidine  gluconate 4 % topical liquid clobetasoL 0.05 % topical cream HYDROcodone  10 mg-acetaminophen  325 mg tablet hydrocortisone 2.5 % topical cream with perineal applicator methocarbamoL 500 mg tablet mupirocin 2 % topical ointment omeprazole  20 mg capsule,delayed release rosuvastatin 40 mg tablet Simponi 50 mg/0.5 mL subcutaneous pen injector Trintellix 20 mg tablet  Review of Systems  Constitutional: Negative.   HENT: Negative.    Eyes: Negative.   Respiratory: Negative.    Cardiovascular: Negative.   Gastrointestinal: Negative.   Genitourinary: Negative.   Musculoskeletal:  Positive for back pain and gait problem.  Neurological:  Positive for weakness and numbness.  Psychiatric/Behavioral: Negative.      Last menstrual period 09/24/2012. Physical Exam Constitutional:      Appearance: Normal appearance.  HENT:     Head: Normocephalic and atraumatic.     Right Ear: External ear normal.     Left Ear: External ear normal.     Nose: Nose normal.     Mouth/Throat:     Pharynx: Oropharynx is clear.   Eyes:     Conjunctiva/sclera: Conjunctivae normal.    Cardiovascular:     Rate and Rhythm: Normal rate and regular rhythm.     Pulses: Normal pulses.  Pulmonary:     Effort: Pulmonary effort is normal.  Abdominal:     General: Abdomen is flat.   Musculoskeletal:     Cervical back: Normal range of motion.     Comments: Gait and Station: Appearance: ambulating with no assistive devices and antalgic gait.  Constitutional: General Appearance: healthy-appearing and distress (mild).  Psychiatric: Mood and Affect: active and alert.  Cardiovascular System: Edema Right: none; Dorsalis and posterior tibial pulses 2+. Edema Left: none.  Abdomen: Inspection and  Palpation: non-distended and no tenderness.  Skin: Inspection and palpation: no rash.  Lumbar Spine: Inspection: normal alignment. Bony Palpation of the Lumbar Spine: tender at lumbosacral junction.. Bony Palpation of the Right Hip: no tenderness of the greater trochanter and tenderness of the SI joint; Pelvis stable. Bony Palpation of the Left Hip: no tenderness of the greater trochanter and tenderness of the SI joint. Soft Tissue Palpation on the Right: No flank pain with percussion. Active Range of Motion: limited flexion and extention.  Motor Strength: L1 Motor Strength on the Right: hip flexion iliopsoas 5/5. L1 Motor Strength on the Left: hip flexion iliopsoas 5/5. L2-L4 Motor Strength on the Right: knee extension quadriceps 5/5. L2-L4 Motor Strength on the Left: knee extension quadriceps 5/5. L5 Motor Strength on the Right: ankle dorsiflexion tibialis anterior 5/5 and great toe extension extensor hallucis longus 4/5. L5 Motor Strength on the Left: ankle dorsiflexion tibialis anterior 5/5 and great toe extension extensor hallucis longus 4/5. S1 Motor Strength on the Right: plantar flexion gastrocnemius 5/5. S1 Motor Strength on the Left: plantar flexion gastrocnemius 5/5.  Neurological System: Knee Reflex Right: normal (2). Knee Reflex Left: normal (2). Ankle Reflex Right:  normal (2). Ankle Reflex Left: normal (2). Babinski Reflex Right: plantar reflex absent. Babinski Reflex Left: plantar reflex absent. Sensation on the Right: normal distal extremities. Sensation on the Left: normal distal extremities. Special Tests on the Right: no clonus of the ankle/knee and seated straight leg raising test positive. Special Tests on the Left: no clonus of the ankle/knee and seated straight leg raising test positive.  Radiating down the right leg to the lateral aspect of the distal knee as well as the left.   Skin:    General: Skin is warm and dry.   Neurological:     Mental Status: She is alert.    MRI  lumbar spine demonstrates moderately severe bulging disc osteophyte complex at L3-4. Right lateral recess stenosis and left lateral recess stenosis impinging upon the left L4 nerve root. Disc degeneration small disc protrusion L4-5. Mild impingement on the right L5 nerve root with lateral recess stenosis. Bulging disc at L2-3. The impingement L4-5 on the right secondary to disc herniation and severe spinal stenosis is noted.  High-grade possible ovarian cystic lesion or peritoneal inclusion cyst. Consider further evaluation with CT scan. This measures 2.2 x 3.3 cm  Assessment/Plan Impression:  1. Bilateral lower extremity radicular pain secondary to disc herniation and spinal stenosis at L4-5 with a disc herniation L4-5 to the right EHL weakness bilaterally refractory to rest, activity modification, physical therapy and epidural steroid injection 2. History of psoriasis 3. Recently on injection weight loss  Plan:  We discussed options living with her symptoms versus lumbar decompression she would like to proceed with the latter  I had an extensive discussion with the patient concerning the pathology relevant anatomy and treatment options. At this point exhausting conservative treatment and in the presence of a neurologic deficit we discussed microlumbar decompression. I discussed the risks and benefits including bleeding, infection, DVT, PE, anesthetic complications, worsening in their symptoms, improvement in their symptoms, C SF leakage, epidural fibrosis, need for future surgeries such as revision discectomy and lumbar fusion. I also indicated that this is an operation to basically decompress the nerve roots to allow recovery as opposed to fixing a herniated disc if it is encountered and that the incidence of recurrent chest disc herniation can approach 15%. Also that nerve root recovery is variable and may not recover completely. Any ligament or bone that is contributing to compressing the nerves  will be removed as well.  I discussed the operative course including overnight in the hospital. Immediate ambulation. Follow-up in 2 weeks for suture removal. 6 weeks until healing of the herniation and surgical incision followed by 6 weeks of reconditioning and strengthening of the core musculature. Also discussed the need to employ the concepts of disc pressure management and core motion following the surgery to minimize the risk of recurrent disc herniation. We will obtain preoperative clearance i if necessary and proceed accordingly.  No history of DVT or MRSA. Preoperative clearance primary care and rheumatologist. She has a history of psoriasis. Has monthly injection.  Oxycodone . Kefzol .  Plan bilateral hemilaminotomy L4-5, microdiscectomy  Darice CHRISTELLA Randy, PA-C for Dr. Duwayne 09/04/2023, 10:56 AM

## 2023-09-04 NOTE — H&P (Signed)
 Caitlin Moran is an 63 y.o. female.   Chief Complaint: back and leg pain HPI: Reason for Visit: Diagnositc Results (lumbar MRI) Context: 1.5 years Location (Lower Extremity): lower back pain ; thigh pain bilateral, , ; bilateral hips Severity: pain level 4/10 Aggravating Factors: walking for ; sitting for long periods of time Associated Symptoms: no numbness/tingling; no weakness Medications: Hydrocodone  prescribed by Lauraine Hamlet, NP Pain with standing and sitting. No fevers or chills change in bowel or bladder  Past Medical History:  Diagnosis Date   Anxiety    Arthritis of neck    Depression    GERD (gastroesophageal reflux disease)    Headache    History of kidney stones    a.) s/p multiple ESWLs in the past   Hyperlipidemia    IBS (irritable bowel syndrome)    OSA on CPAP    Polycythemia vera (HCC)    ruled out   PONV (postoperative nausea and vomiting)    Didn't go to sleep all the way  during colonoscopy.   Psoriatic arthritis (HCC)    Spinal stenosis     Past Surgical History:  Procedure Laterality Date   ABLATION     unquestionable   BIOPSY  05/01/2018   Procedure: BIOPSY;  Surgeon: Wilhelmenia Aloha Raddle., MD;  Location: [facility] ENDOSCOPY;  Service: Gastroenterology;;   BIOPSY  05/24/2020   Procedure: BIOPSY;  Surgeon: Wilhelmenia Aloha Raddle., MD;  Location: [facility];  Service: Gastroenterology;;   CESAREAN SECTION     CHOLECYSTECTOMY     COLONOSCOPY     COLONOSCOPY WITH PROPOFOL  N/A 05/24/2020   Procedure: COLONOSCOPY WITH PROPOFOL ;  Surgeon: Wilhelmenia Aloha Raddle., MD;  Location: [facility];  Service: Gastroenterology;  Laterality: N/A;   CYSTOSCOPY W/ RETROGRADES Right 12/25/2022   Procedure: CYSTOSCOPY WITH RETROGRADE PYELOGRAM;  Surgeon: Penne Knee, MD;  Location: [facility];  Service: Urology;  Laterality: Right;   CYSTOSCOPY/URETEROSCOPY/HOLMIUM LASER/STENT PLACEMENT Right 12/25/2022   Procedure: CYSTOSCOPY/URETEROSCOPY/HOLMIUM LASER/STENT  PLACEMENT;  Surgeon: Penne Knee, MD;  Location: [facility];  Service: Urology;  Laterality: Right;   ESOPHAGOGASTRODUODENOSCOPY     ESOPHAGOGASTRODUODENOSCOPY N/A 05/01/2018   Procedure: ESOPHAGOGASTRODUODENOSCOPY (EGD);  Surgeon: Wilhelmenia Aloha Raddle., MD;  Location: [facility] ENDOSCOPY;  Service: Gastroenterology;  Laterality: N/A;   ESOPHAGOGASTRODUODENOSCOPY (EGD) WITH PROPOFOL  N/A 05/24/2020   Procedure: ESOPHAGOGASTRODUODENOSCOPY (EGD) WITH PROPOFOL ;  Surgeon: Wilhelmenia Aloha Raddle., MD;  Location: [facility] ENDOSCOPY;  Service: Gastroenterology;  Laterality: N/A;   EUS N/A 05/01/2018   Procedure: UPPER ENDOSCOPIC ULTRASOUND (EUS) RADIAL;  Surgeon: Wilhelmenia Aloha Raddle., MD;  Location: [facility] ENDOSCOPY;  Service: Gastroenterology;  Laterality: N/A;   EXTRACORPOREAL SHOCK WAVE LITHOTRIPSY  02/14/2010   EXTRACORPOREAL SHOCK WAVE LITHOTRIPSY Right 03/30/2022   Procedure: EXTRACORPOREAL SHOCK WAVE LITHOTRIPSY (ESWL);  Surgeon: Twylla Glendia BROCKS, MD;  Location: [facility];  Service: Urology;  Laterality: Right;   HYSTEROSCOPY WITH D & C N/A 11/27/2012   Procedure: DILATATION AND CURETTAGE /HYSTEROSCOPY;  Surgeon: Truman Corona, MD;  Location: [facility];  Service: Gynecology;  Laterality: N/A;   SUBMUCOSAL LIFTING INJECTION  05/24/2020   Procedure: SUBMUCOSAL LIFTING INJECTION;  Surgeon: Wilhelmenia Aloha Raddle., MD;  Location: [facility];  Service: Gastroenterology;;   vericose veins     WISDOM TOOTH EXTRACTION      Family History  Problem Relation Age of Onset   Breast cancer Maternal Aunt    Esophageal cancer Mother    Lung cancer Father    CAD Father    Colon cancer Neg Hx    Inflammatory  bowel disease Neg Hx    Liver disease Neg Hx    Pancreatic cancer Neg Hx    Stomach cancer Neg Hx    Rectal cancer Neg Hx    Social History:  reports that she quit smoking about 32 years ago. Her smoking use included cigarettes. She has been exposed to tobacco smoke. She has never used smokeless tobacco. She  reports current alcohol use of about 7.0 standard drinks of alcohol per week. She reports that she does not use drugs.  Allergies: No Known Allergies  Current meds: aspirin celecoxib 200 mg capsule chlorhexidine  gluconate 4 % topical liquid clobetasoL 0.05 % topical cream HYDROcodone  10 mg-acetaminophen  325 mg tablet hydrocortisone 2.5 % topical cream with perineal applicator methocarbamoL 500 mg tablet mupirocin 2 % topical ointment omeprazole  20 mg capsule,delayed release rosuvastatin 40 mg tablet Simponi 50 mg/0.5 mL subcutaneous pen injector Trintellix 20 mg tablet  Review of Systems  Constitutional: Negative.   HENT: Negative.    Eyes: Negative.   Respiratory: Negative.    Cardiovascular: Negative.   Gastrointestinal: Negative.   Genitourinary: Negative.   Musculoskeletal:  Positive for back pain and gait problem.  Neurological:  Positive for weakness and numbness.  Psychiatric/Behavioral: Negative.      Last menstrual period 09/24/2012. Physical Exam Constitutional:      Appearance: Normal appearance.  HENT:     Head: Normocephalic and atraumatic.     Right Ear: External ear normal.     Left Ear: External ear normal.     Nose: Nose normal.     Mouth/Throat:     Pharynx: Oropharynx is clear.   Eyes:     Conjunctiva/sclera: Conjunctivae normal.    Cardiovascular:     Rate and Rhythm: Normal rate and regular rhythm.     Pulses: Normal pulses.  Pulmonary:     Effort: Pulmonary effort is normal.  Abdominal:     General: Abdomen is flat.   Musculoskeletal:     Cervical back: Normal range of motion.     Comments: Gait and Station: Appearance: ambulating with no assistive devices and antalgic gait.  Constitutional: General Appearance: healthy-appearing and distress (mild).  Psychiatric: Mood and Affect: active and alert.  Cardiovascular System: Edema Right: none; Dorsalis and posterior tibial pulses 2+. Edema Left: none.  Abdomen: Inspection and  Palpation: non-distended and no tenderness.  Skin: Inspection and palpation: no rash.  Lumbar Spine: Inspection: normal alignment. Bony Palpation of the Lumbar Spine: tender at lumbosacral junction.. Bony Palpation of the Right Hip: no tenderness of the greater trochanter and tenderness of the SI joint; Pelvis stable. Bony Palpation of the Left Hip: no tenderness of the greater trochanter and tenderness of the SI joint. Soft Tissue Palpation on the Right: No flank pain with percussion. Active Range of Motion: limited flexion and extention.  Motor Strength: L1 Motor Strength on the Right: hip flexion iliopsoas 5/5. L1 Motor Strength on the Left: hip flexion iliopsoas 5/5. L2-L4 Motor Strength on the Right: knee extension quadriceps 5/5. L2-L4 Motor Strength on the Left: knee extension quadriceps 5/5. L5 Motor Strength on the Right: ankle dorsiflexion tibialis anterior 5/5 and great toe extension extensor hallucis longus 4/5. L5 Motor Strength on the Left: ankle dorsiflexion tibialis anterior 5/5 and great toe extension extensor hallucis longus 4/5. S1 Motor Strength on the Right: plantar flexion gastrocnemius 5/5. S1 Motor Strength on the Left: plantar flexion gastrocnemius 5/5.  Neurological System: Knee Reflex Right: normal (2). Knee Reflex Left: normal (2). Ankle Reflex Right:  normal (2). Ankle Reflex Left: normal (2). Babinski Reflex Right: plantar reflex absent. Babinski Reflex Left: plantar reflex absent. Sensation on the Right: normal distal extremities. Sensation on the Left: normal distal extremities. Special Tests on the Right: no clonus of the ankle/knee and seated straight leg raising test positive. Special Tests on the Left: no clonus of the ankle/knee and seated straight leg raising test positive.  Radiating down the right leg to the lateral aspect of the distal knee as well as the left.   Skin:    General: Skin is warm and dry.   Neurological:     Mental Status: She is alert.    MRI  lumbar spine demonstrates moderately severe bulging disc osteophyte complex at L3-4. Right lateral recess stenosis and left lateral recess stenosis impinging upon the left L4 nerve root. Disc degeneration small disc protrusion L4-5. Mild impingement on the right L5 nerve root with lateral recess stenosis. Bulging disc at L2-3. The impingement L4-5 on the right secondary to disc herniation and severe spinal stenosis is noted.  High-grade possible ovarian cystic lesion or peritoneal inclusion cyst. Consider further evaluation with CT scan. This measures 2.2 x 3.3 cm  Assessment/Plan Impression:  1. Bilateral lower extremity radicular pain secondary to disc herniation and spinal stenosis at L4-5 with a disc herniation L4-5 to the right EHL weakness bilaterally refractory to rest, activity modification, physical therapy and epidural steroid injection 2. History of psoriasis 3. Recently on injection weight loss  Plan:  We discussed options living with her symptoms versus lumbar decompression she would like to proceed with the latter  I had an extensive discussion with the patient concerning the pathology relevant anatomy and treatment options. At this point exhausting conservative treatment and in the presence of a neurologic deficit we discussed microlumbar decompression. I discussed the risks and benefits including bleeding, infection, DVT, PE, anesthetic complications, worsening in their symptoms, improvement in their symptoms, C SF leakage, epidural fibrosis, need for future surgeries such as revision discectomy and lumbar fusion. I also indicated that this is an operation to basically decompress the nerve roots to allow recovery as opposed to fixing a herniated disc if it is encountered and that the incidence of recurrent chest disc herniation can approach 15%. Also that nerve root recovery is variable and may not recover completely. Any ligament or bone that is contributing to compressing the nerves  will be removed as well.  I discussed the operative course including overnight in the hospital. Immediate ambulation. Follow-up in 2 weeks for suture removal. 6 weeks until healing of the herniation and surgical incision followed by 6 weeks of reconditioning and strengthening of the core musculature. Also discussed the need to employ the concepts of disc pressure management and core motion following the surgery to minimize the risk of recurrent disc herniation. We will obtain preoperative clearance i if necessary and proceed accordingly.  No history of DVT or MRSA. Preoperative clearance primary care and rheumatologist. She has a history of psoriasis. Has monthly injection.  Oxycodone . Kefzol .  Plan bilateral hemilaminotomy L4-5, microdiscectomy  Darice CHRISTELLA Randy, PA-C for Dr. Duwayne 09/04/2023, 10:56 AM

## 2023-09-11 NOTE — Progress Notes (Signed)
 Surgical Instructions   Your procedure is scheduled on Thursday, July 10th, 2025. Report to [facility] Main Entrance A at 5:30 A.M., then check in with the Admitting office. Any questions or running late day of surgery: call [phone]  Questions prior to your surgery date: call [phone], Monday-Friday, 8am-4pm. If you experience any cold or flu symptoms such as cough, fever, chills, shortness of breath, etc. between now and your scheduled surgery, please notify us  at the above number.     Remember:  Do not eat after midnight the night before your surgery  You may drink clear liquids until 4:30 the morning of your surgery.   Clear liquids allowed are: Water, Non-Citrus Juices (without pulp), Carbonated Beverages, Clear Tea (no milk, honey, etc.), Black Coffee Only (NO MILK, CREAM OR POWDERED CREAMER of any kind), and Gatorade.  Patient Instructions  The night before surgery:  No food after midnight. ONLY clear liquids after midnight  The day of surgery (if you do NOT have diabetes):  Drink ONE (1) Pre-Surgery Clear Ensure by 4:30 the morning of surgery. Drink in one sitting. Do not sip.  This drink was given to you during your hospital  pre-op appointment visit.  Nothing else to drink after completing the  Pre-Surgery Clear Ensure.          If you have questions, please contact your surgeon's office.     Take these medicines the morning of surgery with A SIP OF WATER: Omeprazole  (Prilosec) Rosuvastatin (Crestor) Vortioxetine HBr (Trintellix)   May take these medicines IF NEEDED: Hydrocodone -acetaminophen  (Norco) Methocarbamol (Robaxin)   Please follow your surgeon's instructions on when to stop Aspirin.  If no instructions received, reach out to your surgeon's office.    One week prior to surgery, STOP taking any Aspirin (unless otherwise instructed by your surgeon) Aleve, Naproxen, Ibuprofen, Motrin, Advil, Goody's, BC's, all herbal medications, fish oil, and  non-prescription vitamins.  This includes Celecoxib (Celebrex).  Tirzepatide (Mounjaro) should be held for 7 days prior to surgery.  Your last dose should be on or before, Wednesday, July 2nd.                       Do NOT Smoke (Tobacco/Vaping) for 24 hours prior to your procedure.  If you use a CPAP at night, you may bring your mask/headgear for your overnight stay.   You will be asked to remove any contacts, glasses, piercing's, hearing aid's, dentures/partials prior to surgery. Please bring cases for these items if needed.    Patients discharged the day of surgery will not be allowed to drive home, and someone needs to stay with them for 24 hours.  SURGICAL WAITING ROOM VISITATION Patients may have no more than 2 support people in the waiting area - these visitors may rotate.   Pre-op nurse will coordinate an appropriate time for 1 ADULT support person, who may not rotate, to accompany patient in pre-op.  Children under the age of 40 must have an adult with them who is not the patient and must remain in the main waiting area with an adult.  If the patient needs to stay at the hospital during part of their recovery, the visitor guidelines for inpatient rooms apply.  Please refer to the [facility] website for the visitor guidelines for any additional information.   If you received a COVID test during your pre-op visit  it is requested that you wear a mask when out in public, stay away from  anyone that may not be feeling well and notify your surgeon if you develop symptoms. If you have been in contact with anyone that has tested positive in the last 10 days please notify you surgeon.      Pre-operative 5 CHG Bathing Instructions   You can play a key role in reducing the risk of infection after surgery. Your skin needs to be as free of germs as possible. You can reduce the number of germs on your skin by washing with CHG (chlorhexidine  gluconate) soap before surgery. CHG is an  antiseptic soap that kills germs and continues to kill germs even after washing.   DO NOT use if you have an allergy to chlorhexidine /CHG or antibacterial soaps. If your skin becomes reddened or irritated, stop using the CHG and notify one of our RNs at [phone].   Please shower with the CHG soap starting 4 days before surgery using the following schedule:     Please keep in mind the following:  DO NOT shave, including legs and underarms, starting the day of your first shower.   You may shave your face at any point before/day of surgery.  Place clean sheets on your bed the day you start using CHG soap. Use a clean washcloth (not used since being washed) for each shower. DO NOT sleep with pets once you start using the CHG.   CHG Shower Instructions:  Wash your face and private area with normal soap. If you choose to wash your hair, wash first with your normal shampoo.  After you use shampoo/soap, rinse your hair and body thoroughly to remove shampoo/soap residue.  Turn the water OFF and apply about 3 tablespoons (45 ml) of CHG soap to a CLEAN washcloth.  Apply CHG soap ONLY FROM YOUR NECK DOWN TO YOUR TOES (washing for 3-5 minutes)  DO NOT use CHG soap on face, private areas, open wounds, or sores.  Pay special attention to the area where your surgery is being performed.  If you are having back surgery, having someone wash your back for you may be helpful. Wait 2 minutes after CHG soap is applied, then you may rinse off the CHG soap.  Pat dry with a clean towel  Put on clean clothes/pajamas   If you choose to wear lotion, please use ONLY the CHG-compatible lotions that are listed below.  Additional instructions for the day of surgery: DO NOT APPLY any lotions, deodorants, cologne, or perfumes.   Do not bring valuables to the hospital. [facility] is not responsible for any belongings/valuables. Do not wear nail polish, gel polish, artificial nails, or any other type of covering on  natural nails (fingers and toes) Do not wear jewelry or makeup Put on clean/comfortable clothes.  Please brush your teeth.  Ask your nurse before applying any prescription medications to the skin.     CHG Compatible Lotions   Aveeno Moisturizing lotion  Cetaphil Moisturizing Cream  Cetaphil Moisturizing Lotion  Clairol Herbal Essence Moisturizing Lotion, Dry Skin  Clairol Herbal Essence Moisturizing Lotion, Extra Dry Skin  Clairol Herbal Essence Moisturizing Lotion, Normal Skin  Curel Age Defying Therapeutic Moisturizing Lotion with Alpha Hydroxy  Curel Extreme Care Body Lotion  Curel Soothing Hands Moisturizing Hand Lotion  Curel Therapeutic Moisturizing Cream, Fragrance-Free  Curel Therapeutic Moisturizing Lotion, Fragrance-Free  Curel Therapeutic Moisturizing Lotion, Original Formula  Eucerin Daily Replenishing Lotion  Eucerin Dry Skin Therapy Plus Alpha Hydroxy Crme  Eucerin Dry Skin Therapy Plus Alpha Hydroxy Lotion  Eucerin Original Crme  Eucerin Original Lotion  Eucerin Plus Crme Eucerin Plus Lotion  Eucerin TriLipid Replenishing Lotion  Keri Anti-Bacterial Hand Lotion  Keri Deep Conditioning Original Lotion Dry Skin Formula Softly Scented  Keri Deep Conditioning Original Lotion, Fragrance Free Sensitive Skin Formula  Keri Lotion Fast Absorbing Fragrance Free Sensitive Skin Formula  Keri Lotion Fast Absorbing Softly Scented Dry Skin Formula  Keri Original Lotion  Keri Skin Renewal Lotion Keri Silky Smooth Lotion  Keri Silky Smooth Sensitive Skin Lotion  Nivea Body Creamy Conditioning Oil  Nivea Body Extra Enriched Lotion  Nivea Body Original Lotion  Nivea Body Sheer Moisturizing Lotion Nivea Crme  Nivea Skin Firming Lotion  NutraDerm 30 Skin Lotion  NutraDerm Skin Lotion  NutraDerm Therapeutic Skin Cream  NutraDerm Therapeutic Skin Lotion  ProShield Protective Hand Cream  Provon moisturizing lotion  Please read over the following fact sheets that you were  given.

## 2023-09-12 ENCOUNTER — Encounter (HOSPITAL_COMMUNITY)
Admission: RE | Admit: 2023-09-12 | Discharge: 2023-09-12 | Disposition: A | Discharge status: Home / Self Care | Source: Ambulatory Visit | Attending: Specialist | Admitting: Specialist

## 2023-09-12 ENCOUNTER — Ambulatory Visit (HOSPITAL_COMMUNITY)
Admission: RE | Admit: 2023-09-12 | Discharge: 2023-09-12 | Disposition: A | Discharge status: Home / Self Care | Source: Ambulatory Visit | Attending: Orthopedic Surgery | Admitting: Orthopedic Surgery

## 2023-09-12 ENCOUNTER — Other Ambulatory Visit: Payer: Self-pay

## 2023-09-12 ENCOUNTER — Other Ambulatory Visit: Payer: Self-pay | Admitting: Obstetrics and Gynecology

## 2023-09-12 ENCOUNTER — Encounter (HOSPITAL_COMMUNITY): Payer: Self-pay

## 2023-09-12 VITALS — BP 122/75 | HR 59 | Temp 97.6°F | Resp 17 | Ht 66.0 in | Wt 197.1 lb

## 2023-09-12 DIAGNOSIS — M48062 Spinal stenosis, lumbar region with neurogenic claudication: Secondary | ICD-10-CM | POA: Diagnosis present

## 2023-09-12 DIAGNOSIS — N9489 Other specified conditions associated with female genital organs and menstrual cycle: Secondary | ICD-10-CM

## 2023-09-12 DIAGNOSIS — Z01818 Encounter for other preprocedural examination: Secondary | ICD-10-CM | POA: Insufficient documentation

## 2023-09-12 LAB — BASIC METABOLIC PANEL WITH GFR
Anion gap: 7 (ref 5–15)
BUN: 19 mg/dL (ref 8–23)
CO2: 28 mmol/L (ref 22–32)
Calcium: 9.5 mg/dL (ref 8.9–10.3)
Chloride: 104 mmol/L (ref 98–111)
Creatinine, Ser: 0.92 mg/dL (ref 0.44–1.00)
GFR, Estimated: >60 mL/min (ref >60)
Glucose, Bld: 86 mg/dL (ref 70–99)
Potassium: 4.1 mmol/L (ref 3.5–5.1)
Sodium: 139 mmol/L (ref 135–145)

## 2023-09-12 LAB — CBC
HCT: 44.6 % (ref 36.0–46.0)
Hemoglobin: 14.7 g/dL (ref 12.0–15.0)
MCH: 30.3 pg (ref 26.0–34.0)
MCHC: 33 g/dL (ref 30.0–36.0)
MCV: 92 fL (ref 80.0–100.0)
Platelets: 235 10*3/uL (ref 150–400)
RBC: 4.85 MIL/uL (ref 3.87–5.11)
RDW: 12.2 % (ref 11.5–15.5)
WBC: 10.8 10*3/uL — ABNORMAL HIGH (ref 4.0–10.5)
nRBC: 0 % (ref 0.0–0.2)

## 2023-09-12 LAB — SURGICAL PCR SCREEN

## 2023-09-12 NOTE — Progress Notes (Signed)
 PCP - Dr. Ryan Hives Cardiologist - denies Neurologist - Greig Forbes, NP  PPM/ICD - denies   Chest x-ray - n/a Lumbar Xray - 09/12/23 EKG - requested from PCP Stress Test - denies ECHO - denies Cardiac Cath - denies  Sleep Study - OSA+ - CPAP but does not know settings  No DM  Last dose of GLP1 agonist-  patient is aware that last dose of Mounjaro has to be on or before Wednesday, July 2nd    Blood Thinner Instructions: n/a Aspirin Instructions: patient instructed to follow up with Dr. Duwayne about when to stop Aspirin  ERAS Protcol - clears until 0430 PRE-SURGERY Ensure or G2- Ensure as ordered  COVID TEST- n/a   Anesthesia review:  yes - Mounjaro  Patient denies shortness of breath, fever, cough and chest pain at PAT appointment   All instructions explained to the patient, with a verbal understanding of the material. Patient agrees to go over the instructions while at home for a better understanding. Patient also instructed to self quarantine after being tested for COVID-19. The opportunity to ask questions was provided.

## 2023-09-13 NOTE — Progress Notes (Signed)
 Anesthesia Chart Review:  Case: [id number] Date/Time: 09/20/23 0715   Procedure: DECOMPRESSIVE LUMBAR LAMINECTOMY LEVEL 1 (Bilateral) - Bilateral Hemi-laminotomy L4-5 Microdiscectomy   Anesthesia type: General   Diagnosis: Spinal stenosis of lumbar region, unspecified whether neurogenic claudication present [M48.061]   Pre-op diagnosis: STENOSIS herniated nucleus pulposus   [facility] ROOM [facility] / [facility] OR   Surgeons: Duwayne Purchase, MD       DISCUSSION: Patient is a 63 year old female scheduled for the above procedure.  History includes former smoker (quit 11/05/90), postoperative N/V, OSA (uses BiPAP), psoriatic arthritis, HLD, GERD, IBS, cholecystectomy, varicose veins (s/p right GSV laser ablation 02/28/18). On 04/10/18, she had normal sequencing study with negative for clinically relevant mutation in JAK2 (exons 12, 14) inclusive of V617, MPL (exon 10), and CALR (exon 9) which was done to help rule out polycythemia vera. Secondary causes of her mild polycythemia like OSA, mild obesity felt more likely.   She had preoperative surgical clearance evaluation on 07/17/2023 by Ronal Marie, FNP. Records scanned under Media tab, 08/14/23, 09/13/23.   Anesthesia team to evaluate on the day of surgery.  Nasal PCR was not valid.  She needs repeat T&S day of surgery due to positive antibodies.  Last Mounjaro will be on or before 09/12/2022.  She will follow-up with Dr. Duwayne regarding perioperative aspirin instructions.   VS: BP 122/75   Pulse (!) 59   Temp 36.4 C   Resp 17   Ht 5' 6 (1.676 m)   Wt 89.4 kg   LMP 09/24/2012   SpO2 98%   BMI 31.81 kg/m    PROVIDERS: Clarice Nottingham, MD is PCP at [facility]) Lalama, Deanna, PA-C is rheumatology provider Cary No, NP is neurology provider for OSA Penne Knee, MD is urologist (for nephrolithiasis and microscopic hematuria)   LABS: Labs reviewed: Acceptable for surgery.  Total bilirubin 0.9, AST 22, ALT 21 on 07/24/23 at  [facility]. (all labs ordered are listed, but only abnormal results are displayed)  Labs Reviewed  SURGICAL PCR SCREEN - Abnormal; Notable for the following components:      Result Value   MRSA, PCR   (*)    Value: INVALID, UNABLE TO DETERMINE THE PRESENCE OF TARGET DUE TO SPECIMEN INTEGRITY. RECOLLECTION REQUESTED.   Staphylococcus aureus   (*)    Value: INVALID, UNABLE TO DETERMINE THE PRESENCE OF TARGET DUE TO SPECIMEN INTEGRITY. RECOLLECTION REQUESTED.   All other components within normal limits  CBC - Abnormal; Notable for the following components:   WBC 10.8 (*)    All other components within normal limits  BASIC METABOLIC PANEL WITH GFR     IMAGES: Xray L-spine 09/12/23: IMPRESSION: 1. Mild straightening of the normal lumbar lordosis, which may be due to muscular spasm. No acute fracture or malalignment of the lumbar spine. 2. Mild disc disease at L3-L4 with apparent neuroforaminal stenosis at L3-L4, L4-L5, and L5-S1.    EKG: 07/16/24 ([facility]): Sinus rhythm with rate variation.   CV: CT Cardiac Scoring 04/13/21: IMPRESSION: Coronary calcium score is 5.05 and this is at percentile 70 for patients of the same age, gender and ethnicity.    Past Medical History:  Diagnosis Date   Anxiety    Arthritis of neck    Depression    GERD (gastroesophageal reflux disease)    Headache    History of kidney stones    a.) s/p multiple ESWLs in the past   Hyperlipidemia    IBS (irritable bowel syndrome)  OSA on CPAP    Polycythemia vera (HCC)    ruled out   PONV (postoperative nausea and vomiting)    Didn't go to sleep all the way  during colonoscopy.   Psoriatic arthritis (HCC)    Spinal stenosis     Past Surgical History:  Procedure Laterality Date   BIOPSY  05/01/2018   Procedure: BIOPSY;  Surgeon: Wilhelmenia Aloha Raddle., MD;  Location: [facility] ENDOSCOPY;  Service: Gastroenterology;;   BIOPSY  05/24/2020   Procedure: BIOPSY;  Surgeon: Wilhelmenia Aloha Raddle., MD;  Location: [facility]  ENDOSCOPY;  Service: Gastroenterology;;   CESAREAN SECTION     CHOLECYSTECTOMY     COLONOSCOPY     COLONOSCOPY WITH PROPOFOL  N/A 05/24/2020   Procedure: COLONOSCOPY WITH PROPOFOL ;  Surgeon: Wilhelmenia Aloha Raddle., MD;  Location: [facility] ENDOSCOPY;  Service: Gastroenterology;  Laterality: N/A;   CYSTOSCOPY W/ RETROGRADES Right 12/25/2022   Procedure: CYSTOSCOPY WITH RETROGRADE PYELOGRAM;  Surgeon: Penne Knee, MD;  Location: [facility] ORS;  Service: Urology;  Laterality: Right;   CYSTOSCOPY/URETEROSCOPY/HOLMIUM LASER/STENT PLACEMENT Right 12/25/2022   Procedure: CYSTOSCOPY/URETEROSCOPY/HOLMIUM LASER/STENT PLACEMENT;  Surgeon: Penne Knee, MD;  Location: [facility] ORS;  Service: Urology;  Laterality: Right;   ESOPHAGOGASTRODUODENOSCOPY     ESOPHAGOGASTRODUODENOSCOPY N/A 05/01/2018   Procedure: ESOPHAGOGASTRODUODENOSCOPY (EGD);  Surgeon: Wilhelmenia Aloha Raddle., MD;  Location: [facility] ENDOSCOPY;  Service: Gastroenterology;  Laterality: N/A;   ESOPHAGOGASTRODUODENOSCOPY (EGD) WITH PROPOFOL  N/A 05/24/2020   Procedure: ESOPHAGOGASTRODUODENOSCOPY (EGD) WITH PROPOFOL ;  Surgeon: Wilhelmenia Aloha Raddle., MD;  Location: [facility] ENDOSCOPY;  Service: Gastroenterology;  Laterality: N/A;   EUS N/A 05/01/2018   Procedure: UPPER ENDOSCOPIC ULTRASOUND (EUS) RADIAL;  Surgeon: Wilhelmenia Aloha Raddle., MD;  Location: [facility] ENDOSCOPY;  Service: Gastroenterology;  Laterality: N/A;   EXTRACORPOREAL SHOCK WAVE LITHOTRIPSY  02/14/2010   EXTRACORPOREAL SHOCK WAVE LITHOTRIPSY Right 03/30/2022   Procedure: EXTRACORPOREAL SHOCK WAVE LITHOTRIPSY (ESWL);  Surgeon: Twylla Glendia BROCKS, MD;  Location: [facility] ORS;  Service: Urology;  Laterality: Right;   HYSTEROSCOPY WITH D & C N/A 11/27/2012   Procedure: DILATATION AND CURETTAGE /HYSTEROSCOPY;  Surgeon: Truman Corona, MD;  Location: [facility] ORS;  Service: Gynecology;  Laterality: N/A;   SUBMUCOSAL LIFTING INJECTION  05/24/2020   Procedure: SUBMUCOSAL LIFTING INJECTION;  Surgeon: Wilhelmenia Aloha Raddle.,  MD;  Location: [facility] ENDOSCOPY;  Service: Gastroenterology;;   vericose veins     WISDOM TOOTH EXTRACTION      MEDICATIONS:  aspirin EC 81 MG tablet   celecoxib (CELEBREX) 200 MG capsule   Cholecalciferol (VITAMIN D) 50 MCG (2000 UT) CAPS   clobetasol cream (TEMOVATE) 0.05 %   Golimumab 50 MG/0.5ML SOAJ   HYDROcodone -acetaminophen  (NORCO) 10-325 MG tablet   methocarbamol (ROBAXIN) 500 MG tablet   Multiple Minerals-Vitamins (CAL MAG ZINC +D3 PO)   omeprazole  (PRILOSEC OTC) 20 MG tablet   rosuvastatin (CRESTOR) 40 MG tablet   tirzepatide (MOUNJARO) 15 MG/0.5ML Pen   vortioxetine HBr (TRINTELLIX) 20 MG TABS tablet   No current facility-administered medications for this encounter.   Isaiah Ruder, PA-C Surgical Short Stay/Anesthesiology [facility] Phone [phone] [facility] Phone [phone] 09/13/2023 6:14 PM

## 2023-09-13 NOTE — Anesthesia Preprocedure Evaluation (Addendum)
 Anesthesia Evaluation  Patient identified by MRN, date of birth, ID band Patient awake    Reviewed: Allergy & Precautions, NPO status , Patient's Chart, lab work & pertinent test results  History of Anesthesia Complications (+) PONV and history of anesthetic complications  Airway Mallampati: II  TM Distance: >3 FB Neck ROM: Full    Dental  (+) Teeth Intact, Dental Advisory Given, Caps   Pulmonary sleep apnea and Continuous Positive Airway Pressure Ventilation , former smoker   breath sounds clear to auscultation       Cardiovascular negative cardio ROS  Rhythm:Regular Rate:Normal     Neuro/Psych  Headaches PSYCHIATRIC DISORDERS Anxiety Depression     Neuromuscular disease    GI/Hepatic Neg liver ROS,GERD  Medicated,,  Endo/Other  negative endocrine ROS    Renal/GU negative Renal ROS     Musculoskeletal  (+) Arthritis ,    Abdominal   Peds  Hematology negative hematology ROS (+)   Anesthesia Other Findings   Reproductive/Obstetrics                              Anesthesia Physical Anesthesia Plan  ASA: 2  Anesthesia Plan: General   Post-op Pain Management: Tylenol  PO (pre-op)* and Toradol  IV (intra-op)*   Induction: Intravenous  PONV Risk Score and Plan: 4 or greater and Ondansetron , Dexamethasone , Midazolam , Scopolamine  patch - Pre-op, Treatment may vary due to age or medical condition and Propofol  infusion  Airway Management Planned: Oral ETT  Additional Equipment: None  Intra-op Plan:   Post-operative Plan: Extubation in OR  Informed Consent: I have reviewed the patients History and Physical, chart, labs and discussed the procedure including the risks, benefits and alternatives for the proposed anesthesia with the patient or authorized representative who has indicated his/her understanding and acceptance.     Dental advisory given  Plan Discussed with:  CRNA  Anesthesia Plan Comments: (PAT note written 09/13/2023 by Allison Zelenak, PA-C.  )         Anesthesia Quick Evaluation

## 2023-09-17 NOTE — Progress Notes (Signed)
 Invalid PCR result from sample taken at pre-op appointment. Will need to be recollected DOS. Order placed.

## 2023-09-19 ENCOUNTER — Inpatient Hospital Stay: Admission: RE | Admit: 2023-09-19 | Source: Ambulatory Visit

## 2023-09-19 NOTE — Progress Notes (Signed)
 Pt made aware of the surgery time change for 09/20/23 1344-1547, arrival 1140, finish the pre-surgery ensure drink by 1040, and to follow all other previous instructions given.

## 2023-09-20 ENCOUNTER — Other Ambulatory Visit: Payer: Self-pay

## 2023-09-20 ENCOUNTER — Encounter (HOSPITAL_COMMUNITY)
Admission: RE | Disposition: A | Discharge status: Home / Self Care | Payer: Self-pay | Source: Home / Self Care | Attending: Specialist

## 2023-09-20 ENCOUNTER — Encounter (HOSPITAL_COMMUNITY): Payer: Self-pay | Admitting: Specialist

## 2023-09-20 ENCOUNTER — Ambulatory Visit (HOSPITAL_COMMUNITY)
Admission: RE | Admit: 2023-09-20 | Discharge: 2023-09-21 | Disposition: A | Discharge status: Home / Self Care | Attending: Specialist | Admitting: Specialist

## 2023-09-20 ENCOUNTER — Ambulatory Visit (HOSPITAL_COMMUNITY): Admitting: Anesthesiology

## 2023-09-20 ENCOUNTER — Ambulatory Visit (HOSPITAL_COMMUNITY): Payer: Self-pay | Admitting: Vascular Surgery

## 2023-09-20 ENCOUNTER — Ambulatory Visit (HOSPITAL_COMMUNITY)

## 2023-09-20 DIAGNOSIS — F419 Anxiety disorder, unspecified: Secondary | ICD-10-CM | POA: Diagnosis not present

## 2023-09-20 DIAGNOSIS — M1990 Unspecified osteoarthritis, unspecified site: Secondary | ICD-10-CM | POA: Diagnosis not present

## 2023-09-20 DIAGNOSIS — G4733 Obstructive sleep apnea (adult) (pediatric): Secondary | ICD-10-CM | POA: Insufficient documentation

## 2023-09-20 DIAGNOSIS — L4050 Arthropathic psoriasis, unspecified: Secondary | ICD-10-CM | POA: Insufficient documentation

## 2023-09-20 DIAGNOSIS — K219 Gastro-esophageal reflux disease without esophagitis: Secondary | ICD-10-CM | POA: Diagnosis not present

## 2023-09-20 DIAGNOSIS — M5116 Intervertebral disc disorders with radiculopathy, lumbar region: Secondary | ICD-10-CM | POA: Insufficient documentation

## 2023-09-20 DIAGNOSIS — Z87891 Personal history of nicotine dependence: Secondary | ICD-10-CM | POA: Diagnosis not present

## 2023-09-20 DIAGNOSIS — M48061 Spinal stenosis, lumbar region without neurogenic claudication: Secondary | ICD-10-CM | POA: Diagnosis present

## 2023-09-20 DIAGNOSIS — Z01818 Encounter for other preprocedural examination: Secondary | ICD-10-CM

## 2023-09-20 DIAGNOSIS — F32A Depression, unspecified: Secondary | ICD-10-CM | POA: Insufficient documentation

## 2023-09-20 HISTORY — PX: DECOMPRESSIVE LUMBAR LAMINECTOMY LEVEL 1: SHX5791

## 2023-09-20 LAB — SURGICAL PCR SCREEN
MRSA, PCR: NEGATIVE
Staphylococcus aureus: NEGATIVE

## 2023-09-20 SURGERY — DECOMPRESSIVE LUMBAR LAMINECTOMY LEVEL 1
Anesthesia: General | Site: Spine Lumbar | Laterality: Bilateral

## 2023-09-20 MED ORDER — ACETAMINOPHEN 500 MG PO TABS: 1000.0000 mg | ORAL_TABLET | Freq: Once | ORAL | Status: AC

## 2023-09-20 MED ORDER — BUPIVACAINE-EPINEPHRINE (PF) 0.5% -1:200000 IJ SOLN
INTRAMUSCULAR | Status: AC
  Filled 2023-09-20: qty 30

## 2023-09-20 MED ORDER — CEFAZOLIN SODIUM-DEXTROSE 2-4 GM/100ML-% IV SOLN
2.0000 g | INTRAVENOUS | Status: AC
  Administered 2023-09-20: 2 g via INTRAVENOUS

## 2023-09-20 MED ORDER — DROPERIDOL 2.5 MG/ML IJ SOLN: 0.6250 mg | Freq: Once | INTRAMUSCULAR | Status: DC | PRN

## 2023-09-20 MED ORDER — MAGNESIUM CITRATE PO SOLN: 1.0000 | Freq: Once | ORAL | Status: DC | PRN

## 2023-09-20 MED ORDER — THROMBIN 20000 UNITS EX SOLR
CUTANEOUS | Status: DC | PRN
  Administered 2023-09-20: 20 mL via TOPICAL

## 2023-09-20 MED ORDER — METHOCARBAMOL 500 MG PO TABS
500.0000 mg | ORAL_TABLET | Freq: Four times a day (QID) | ORAL | Status: DC | PRN
Start: 2023-09-20 — End: 2023-09-21
  Administered 2023-09-20 – 2023-09-21 (×2): 500 mg via ORAL
  Filled 2023-09-20 (×3): qty 1

## 2023-09-20 MED ORDER — CEFAZOLIN SODIUM-DEXTROSE 2-4 GM/100ML-% IV SOLN
INTRAVENOUS | Status: AC
  Filled 2023-09-20: qty 100

## 2023-09-20 MED ORDER — ACETAMINOPHEN 650 MG RE SUPP: 650.0000 mg | RECTAL | Status: DC | PRN

## 2023-09-20 MED ORDER — EPHEDRINE SULFATE-NACL 50-0.9 MG/10ML-% IV SOSY
PREFILLED_SYRINGE | INTRAVENOUS | Status: DC | PRN
Start: 2023-09-20 — End: 2023-09-20
  Administered 2023-09-20 (×2): 10 mg via INTRAVENOUS
  Administered 2023-09-20: 5 mg via INTRAVENOUS
  Administered 2023-09-20: 10 mg via INTRAVENOUS

## 2023-09-20 MED ORDER — LIDOCAINE 2% (20 MG/ML) 5 ML SYRINGE
INTRAMUSCULAR | Status: AC
  Filled 2023-09-20: qty 5

## 2023-09-20 MED ORDER — ONDANSETRON HCL 4 MG/2ML IJ SOLN
INTRAMUSCULAR | Status: DC | PRN
  Administered 2023-09-20: 4 mg via INTRAVENOUS

## 2023-09-20 MED ORDER — MENTHOL 3 MG MT LOZG: 1.0000 | LOZENGE | OROMUCOSAL | Status: DC | PRN

## 2023-09-20 MED ORDER — DEXAMETHASONE SODIUM PHOSPHATE 10 MG/ML IJ SOLN
INTRAMUSCULAR | Status: DC | PRN
  Administered 2023-09-20: 10 mg via INTRAVENOUS

## 2023-09-20 MED ORDER — DEXAMETHASONE SODIUM PHOSPHATE 10 MG/ML IJ SOLN
INTRAMUSCULAR | Status: AC
  Filled 2023-09-20: qty 1

## 2023-09-20 MED ORDER — PHENOL 1.4 % MT LIQD: 1.0000 | OROMUCOSAL | Status: DC | PRN

## 2023-09-20 MED ORDER — ALUM & MAG HYDROXIDE-SIMETH 200-200-20 MG/5ML PO SUSP: 30.0000 mL | Freq: Four times a day (QID) | ORAL | Status: DC | PRN

## 2023-09-20 MED ORDER — LACTATED RINGERS IV SOLN: INTRAVENOUS | Status: DC

## 2023-09-20 MED ORDER — ACETAMINOPHEN 10 MG/ML IV SOLN: 1000.0000 mg | INTRAVENOUS | Status: DC

## 2023-09-20 MED ORDER — ROCURONIUM BROMIDE 10 MG/ML (PF) SYRINGE
PREFILLED_SYRINGE | INTRAVENOUS | Status: AC
  Filled 2023-09-20: qty 10

## 2023-09-20 MED ORDER — PANTOPRAZOLE SODIUM 40 MG PO TBEC
40.0000 mg | DELAYED_RELEASE_TABLET | Freq: Every day | ORAL | Status: DC
  Administered 2023-09-21: 40 mg via ORAL
  Filled 2023-09-20: qty 1

## 2023-09-20 MED ORDER — OXYCODONE HCL 5 MG PO TABS
10.0000 mg | ORAL_TABLET | ORAL | Status: DC | PRN
  Administered 2023-09-20 – 2023-09-21 (×4): 10 mg via ORAL
  Filled 2023-09-20 (×4): qty 2

## 2023-09-20 MED ORDER — ARTIFICIAL TEARS OPHTHALMIC OINT
TOPICAL_OINTMENT | OPHTHALMIC | Status: AC
Start: 2023-09-20 — End: 2023-09-20
  Filled 2023-09-20: qty 3.5

## 2023-09-20 MED ORDER — HYDROMORPHONE HCL 1 MG/ML IJ SOLN
0.2500 mg | INTRAMUSCULAR | Status: DC | PRN
  Administered 2023-09-20 (×3): 0.5 mg via INTRAVENOUS

## 2023-09-20 MED ORDER — EPHEDRINE 5 MG/ML INJ
INTRAVENOUS | Status: AC
  Filled 2023-09-20: qty 10

## 2023-09-20 MED ORDER — OMEPRAZOLE MAGNESIUM 20 MG PO TBEC: 20.0000 mg | DELAYED_RELEASE_TABLET | Freq: Every day | ORAL | Status: DC

## 2023-09-20 MED ORDER — PHENYLEPHRINE 80 MCG/ML (10ML) SYRINGE FOR IV PUSH (FOR BLOOD PRESSURE SUPPORT)
PREFILLED_SYRINGE | INTRAVENOUS | Status: AC
  Filled 2023-09-20: qty 10

## 2023-09-20 MED ORDER — HYDROMORPHONE HCL 1 MG/ML IJ SOLN
INTRAMUSCULAR | Status: AC
  Filled 2023-09-20: qty 1

## 2023-09-20 MED ORDER — ALBUMIN HUMAN 5 % IV SOLN: INTRAVENOUS | Status: DC | PRN

## 2023-09-20 MED ORDER — ONDANSETRON HCL 4 MG/2ML IJ SOLN
4.0000 mg | Freq: Four times a day (QID) | INTRAMUSCULAR | Status: DC | PRN
Start: 2023-09-20 — End: 2023-09-21

## 2023-09-20 MED ORDER — VORTIOXETINE HBR 20 MG PO TABS
20.0000 mg | ORAL_TABLET | Freq: Every day | ORAL | Status: DC
Start: 2023-09-21 — End: 2023-09-21
  Administered 2023-09-21: 20 mg via ORAL
  Filled 2023-09-20: qty 1

## 2023-09-20 MED ORDER — KCL IN DEXTROSE-NACL 20-5-0.9 MEQ/L-%-% IV SOLN
INTRAVENOUS | Status: DC
  Filled 2023-09-20: qty 1000

## 2023-09-20 MED ORDER — ACETAMINOPHEN 325 MG PO TABS: 650.0000 mg | ORAL_TABLET | ORAL | Status: DC | PRN

## 2023-09-20 MED ORDER — ONDANSETRON HCL 4 MG/2ML IJ SOLN
INTRAMUSCULAR | Status: AC
  Filled 2023-09-20: qty 2

## 2023-09-20 MED ORDER — TIRZEPATIDE 15 MG/0.5ML ~~LOC~~ SOAJ: 15.0000 mg | SUBCUTANEOUS | Status: DC

## 2023-09-20 MED ORDER — MIDAZOLAM HCL 2 MG/2ML IJ SOLN
INTRAMUSCULAR | Status: AC
  Filled 2023-09-20: qty 2

## 2023-09-20 MED ORDER — RISAQUAD PO CAPS
1.0000 | ORAL_CAPSULE | Freq: Every day | ORAL | Status: DC
  Administered 2023-09-20 – 2023-09-21 (×2): 1 via ORAL
  Filled 2023-09-20 (×2): qty 1

## 2023-09-20 MED ORDER — DOCUSATE SODIUM 100 MG PO CAPS
100.0000 mg | ORAL_CAPSULE | Freq: Two times a day (BID) | ORAL | Status: DC
  Administered 2023-09-20 – 2023-09-21 (×2): 100 mg via ORAL
  Filled 2023-09-20 (×2): qty 1

## 2023-09-20 MED ORDER — MEPERIDINE HCL 25 MG/ML IJ SOLN: 6.2500 mg | INTRAMUSCULAR | Status: DC | PRN

## 2023-09-20 MED ORDER — PHENYLEPHRINE 80 MCG/ML (10ML) SYRINGE FOR IV PUSH (FOR BLOOD PRESSURE SUPPORT)
PREFILLED_SYRINGE | INTRAVENOUS | Status: DC | PRN
  Administered 2023-09-20 (×2): 80 ug via INTRAVENOUS
  Administered 2023-09-20: 160 ug via INTRAVENOUS

## 2023-09-20 MED ORDER — LACTATED RINGERS IV SOLN
INTRAVENOUS | Status: DC | PRN
Start: 2023-09-20 — End: 2023-09-20

## 2023-09-20 MED ORDER — VITAMIN D3 25 MCG (1000 UNIT) PO TABS
2000.0000 [IU] | ORAL_TABLET | Freq: Every day | ORAL | Status: DC
  Administered 2023-09-20 – 2023-09-21 (×2): 2000 [IU] via ORAL
  Filled 2023-09-20 (×4): qty 2

## 2023-09-20 MED ORDER — SUGAMMADEX SODIUM 200 MG/2ML IV SOLN
INTRAVENOUS | Status: AC
  Filled 2023-09-20: qty 2

## 2023-09-20 MED ORDER — 0.9 % SODIUM CHLORIDE (POUR BTL) OPTIME
TOPICAL | Status: DC | PRN
  Administered 2023-09-20: 1000 mL

## 2023-09-20 MED ORDER — ROCURONIUM BROMIDE 10 MG/ML (PF) SYRINGE
PREFILLED_SYRINGE | INTRAVENOUS | Status: DC | PRN
  Administered 2023-09-20: 50 mg via INTRAVENOUS
  Administered 2023-09-20: 10 mg via INTRAVENOUS

## 2023-09-20 MED ORDER — FENTANYL CITRATE (PF) 250 MCG/5ML IJ SOLN
INTRAMUSCULAR | Status: DC | PRN
  Administered 2023-09-20 (×4): 50 ug via INTRAVENOUS

## 2023-09-20 MED ORDER — PROPOFOL 10 MG/ML IV BOLUS
INTRAVENOUS | Status: AC
  Filled 2023-09-20: qty 20

## 2023-09-20 MED ORDER — ONDANSETRON HCL 4 MG PO TABS: 4.0000 mg | ORAL_TABLET | Freq: Four times a day (QID) | ORAL | Status: DC | PRN

## 2023-09-20 MED ORDER — SUGAMMADEX SODIUM 200 MG/2ML IV SOLN
INTRAVENOUS | Status: DC | PRN
  Administered 2023-09-20: 150 mg via INTRAVENOUS

## 2023-09-20 MED ORDER — POLYETHYLENE GLYCOL 3350 17 G PO PACK: 17.0000 g | PACK | Freq: Every day | ORAL | Status: DC | PRN

## 2023-09-20 MED ORDER — THROMBIN 20000 UNITS EX SOLR
CUTANEOUS | Status: AC
  Filled 2023-09-20: qty 20000

## 2023-09-20 MED ORDER — HYDROMORPHONE HCL 1 MG/ML IJ SOLN
1.0000 mg | INTRAMUSCULAR | Status: DC | PRN
  Administered 2023-09-20: 1 mg via INTRAVENOUS
  Filled 2023-09-20: qty 1

## 2023-09-20 MED ORDER — OXYCODONE HCL 5 MG PO TABS
ORAL_TABLET | ORAL | Status: AC
  Filled 2023-09-20: qty 1

## 2023-09-20 MED ORDER — PROPOFOL 10 MG/ML IV BOLUS
INTRAVENOUS | Status: DC | PRN
  Administered 2023-09-20: 140 mg via INTRAVENOUS
  Administered 2023-09-20: 40 mg via INTRAVENOUS

## 2023-09-20 MED ORDER — OXYCODONE HCL 5 MG/5ML PO SOLN: 5.0000 mg | Freq: Once | ORAL | Status: AC | PRN

## 2023-09-20 MED ORDER — HYDROMORPHONE HCL 1 MG/ML IJ SOLN
INTRAMUSCULAR | Status: AC
  Filled 2023-09-20: qty 0.5

## 2023-09-20 MED ORDER — LIDOCAINE 2% (20 MG/ML) 5 ML SYRINGE
INTRAMUSCULAR | Status: DC | PRN
  Administered 2023-09-20: 100 mg via INTRAVENOUS

## 2023-09-20 MED ORDER — ACETAMINOPHEN 500 MG PO TABS
ORAL_TABLET | ORAL | Status: AC
  Administered 2023-09-20: 1000 mg via ORAL
  Filled 2023-09-20: qty 2

## 2023-09-20 MED ORDER — OXYCODONE HCL 5 MG PO TABS
5.0000 mg | ORAL_TABLET | Freq: Once | ORAL | Status: AC | PRN
  Administered 2023-09-20: 5 mg via ORAL

## 2023-09-20 MED ORDER — ACETAMINOPHEN 10 MG/ML IV SOLN: 1000.0000 mg | Freq: Once | INTRAVENOUS | Status: DC | PRN

## 2023-09-20 MED ORDER — TRANEXAMIC ACID-NACL 1000-0.7 MG/100ML-% IV SOLN
INTRAVENOUS | Status: AC
  Filled 2023-09-20: qty 100

## 2023-09-20 MED ORDER — ROSUVASTATIN CALCIUM 20 MG PO TABS
40.0000 mg | ORAL_TABLET | Freq: Every day | ORAL | Status: DC
  Administered 2023-09-20 – 2023-09-21 (×2): 40 mg via ORAL
  Filled 2023-09-20 (×2): qty 2

## 2023-09-20 MED ORDER — FENTANYL CITRATE (PF) 250 MCG/5ML IJ SOLN
INTRAMUSCULAR | Status: AC
  Filled 2023-09-20: qty 5

## 2023-09-20 MED ORDER — CEFAZOLIN SODIUM-DEXTROSE 2-4 GM/100ML-% IV SOLN
2.0000 g | Freq: Three times a day (TID) | INTRAVENOUS | Status: AC
  Administered 2023-09-20 – 2023-09-21 (×2): 2 g via INTRAVENOUS
  Filled 2023-09-20 (×2): qty 100

## 2023-09-20 MED ORDER — BUPIVACAINE-EPINEPHRINE 0.5% -1:200000 IJ SOLN
INTRAMUSCULAR | Status: DC | PRN
  Administered 2023-09-20: 5 mL

## 2023-09-20 MED ORDER — MIDAZOLAM HCL 2 MG/2ML IJ SOLN
INTRAMUSCULAR | Status: DC | PRN
  Administered 2023-09-20 (×2): 1 mg via INTRAVENOUS

## 2023-09-20 MED ORDER — VASOPRESSIN 20 UNIT/ML IV SOLN
INTRAVENOUS | Status: AC
  Filled 2023-09-20: qty 1

## 2023-09-20 MED ORDER — ESMOLOL HCL 100 MG/10ML IV SOLN
INTRAVENOUS | Status: DC | PRN
  Administered 2023-09-20: 20 mg via INTRAVENOUS

## 2023-09-20 MED ORDER — OXYCODONE HCL 5 MG PO TABS: 5.0000 mg | ORAL_TABLET | ORAL | Status: DC | PRN

## 2023-09-20 MED ORDER — ORAL CARE MOUTH RINSE: 15.0000 mL | Freq: Once | OROMUCOSAL | Status: AC

## 2023-09-20 MED ORDER — BISACODYL 5 MG PO TBEC
5.0000 mg | DELAYED_RELEASE_TABLET | Freq: Every day | ORAL | Status: DC | PRN
  Administered 2023-09-20: 5 mg via ORAL
  Filled 2023-09-20: qty 1

## 2023-09-20 MED ORDER — METHOCARBAMOL 1000 MG/10ML IJ SOLN: 500.0000 mg | Freq: Four times a day (QID) | INTRAMUSCULAR | Status: DC | PRN

## 2023-09-20 MED ORDER — NOREPINEPHRINE 4 MG/250ML-% IV SOLN
INTRAVENOUS | Status: DC | PRN
  Administered 2023-09-20: 1 ug/min via INTRAVENOUS

## 2023-09-20 MED ORDER — SCOPOLAMINE 1 MG/3DAYS TD PT72: 1.0000 | MEDICATED_PATCH | TRANSDERMAL | Status: DC

## 2023-09-20 MED ORDER — CHLORHEXIDINE GLUCONATE 0.12 % MT SOLN
OROMUCOSAL | Status: AC
  Administered 2023-09-20: 15 mL via OROMUCOSAL
  Filled 2023-09-20: qty 15

## 2023-09-20 MED ORDER — TRANEXAMIC ACID-NACL 1000-0.7 MG/100ML-% IV SOLN
1000.0000 mg | INTRAVENOUS | Status: AC
  Administered 2023-09-20: 1000 mg via INTRAVENOUS

## 2023-09-20 MED ORDER — CHLORHEXIDINE GLUCONATE 0.12 % MT SOLN
15.0000 mL | Freq: Once | OROMUCOSAL | Status: AC
Start: 2023-09-20 — End: 2023-09-20

## 2023-09-20 MED ORDER — ESMOLOL HCL 100 MG/10ML IV SOLN
INTRAVENOUS | Status: AC
  Filled 2023-09-20: qty 10

## 2023-09-20 SURGICAL SUPPLY — 50 items
BAG COUNTER SPONGE SURGICOUNT (BAG) ×1 IMPLANT
BAG DECANTER FOR FLEXI CONT (MISCELLANEOUS) IMPLANT
BAND RUBBER #18 3X1/16 STRL (MISCELLANEOUS) ×2 IMPLANT
BUR EGG ELITE 5.0 (BURR) IMPLANT
BUR RND DIAMOND ELITE 4.0 (BURR) IMPLANT
CLEANER TIP ELECTROSURG 2X2 (MISCELLANEOUS) ×1 IMPLANT
CNTNR URN SCR LID CUP LEK RST (MISCELLANEOUS) ×1 IMPLANT
DRAPE LAPAROTOMY 100X72X124 (DRAPES) ×1 IMPLANT
DRAPE MICROSCOPE SLANT 54X150 (MISCELLANEOUS) ×1 IMPLANT
DRAPE SHEET LG 3/4 BI-LAMINATE (DRAPES) ×1 IMPLANT
DRAPE SURG 17X11 SM STRL (DRAPES) ×1 IMPLANT
DRAPE UTILITY XL STRL (DRAPES) ×1 IMPLANT
DRSG AQUACEL AG ADV 3.5X 4 (GAUZE/BANDAGES/DRESSINGS) IMPLANT
DRSG AQUACEL AG ADV 3.5X 6 (GAUZE/BANDAGES/DRESSINGS) IMPLANT
DRSG TELFA 3X8 NADH STRL (GAUZE/BANDAGES/DRESSINGS) IMPLANT
DURAPREP 26ML APPLICATOR (WOUND CARE) ×1 IMPLANT
DURASEAL SPINE SEALANT 3ML (MISCELLANEOUS) IMPLANT
ELECTRODE BLDE 4.0 EZ CLN MEGD (MISCELLANEOUS) IMPLANT
ELECTRODE REM PT RTRN 9FT ADLT (ELECTROSURGICAL) ×1 IMPLANT
GLOVE BIOGEL PI IND STRL 7.5 (GLOVE) ×1 IMPLANT
GLOVE SURG SS PI 7.0 STRL IVOR (GLOVE) ×1 IMPLANT
GLOVE SURG SS PI 8.0 STRL IVOR (GLOVE) ×2 IMPLANT
GOWN STRL REUS W/ TWL LRG LVL3 (GOWN DISPOSABLE) ×1 IMPLANT
GOWN STRL REUS W/ TWL XL LVL3 (GOWN DISPOSABLE) ×1 IMPLANT
IV CATH 14GX2 1/4 (CATHETERS) ×1 IMPLANT
KIT BASIN OR (CUSTOM PROCEDURE TRAY) ×1 IMPLANT
NDL 22X1.5 STRL (OR ONLY) (MISCELLANEOUS) ×1 IMPLANT
NDL SPNL 18GX3.5 QUINCKE PK (NEEDLE) ×2 IMPLANT
NEEDLE 22X1.5 STRL (OR ONLY) (MISCELLANEOUS) ×1 IMPLANT
NEEDLE SPNL 18GX3.5 QUINCKE PK (NEEDLE) ×2 IMPLANT
PACK LAMINECTOMY NEURO (CUSTOM PROCEDURE TRAY) ×1 IMPLANT
PATTIES SURGICAL .5 X.5 (GAUZE/BANDAGES/DRESSINGS) IMPLANT
PATTIES SURGICAL .75X.75 (GAUZE/BANDAGES/DRESSINGS) ×1 IMPLANT
SOLUTION PRONTOSAN WOUND 350ML (IRRIGATION / IRRIGATOR) IMPLANT
SPONGE SURGIFOAM ABS GEL 100 (HEMOSTASIS) ×1 IMPLANT
SPONGE T-LAP 4X18 ~~LOC~~+RFID (SPONGE) IMPLANT
STAPLER VISISTAT (STAPLE) IMPLANT
STRIP CLOSURE SKIN 1/2X4 (GAUZE/BANDAGES/DRESSINGS) ×1 IMPLANT
SUT NURALON 4 0 TR CR/8 (SUTURE) IMPLANT
SUT PROLENE 3 0 PS 2 (SUTURE) IMPLANT
SUT VIC AB 1 CT1 27XBRD ANTBC (SUTURE) IMPLANT
SUT VIC AB 1-0 CT2 27 (SUTURE) IMPLANT
SUT VIC AB 2-0 CT1 TAPERPNT 27 (SUTURE) IMPLANT
SUT VIC AB 2-0 CT2 27 (SUTURE) IMPLANT
SYR 3ML LL SCALE MARK (SYRINGE) ×1 IMPLANT
TOWEL GREEN STERILE (TOWEL DISPOSABLE) ×1 IMPLANT
TOWEL GREEN STERILE FF (TOWEL DISPOSABLE) ×1 IMPLANT
TRAY FOLEY MTR SLVR 16FR STAT (SET/KITS/TRAYS/PACK) ×1 IMPLANT
WIPE CHG 2% 2PK PREOPERATIVE (MISCELLANEOUS) ×1 IMPLANT
YANKAUER SUCT BULB TIP NO VENT (SUCTIONS) ×1 IMPLANT

## 2023-09-20 NOTE — Discharge Instructions (Signed)

## 2023-09-20 NOTE — Transfer of Care (Signed)
 Immediate Anesthesia Transfer of Care Note  Patient: Caitlin Moran  Procedure(s) Performed: DECOMPRESSIVE LUMBAR LAMINECTOMY Bilateral Hemi-laminotomy Lumbar four-five Microdiscectomy (Bilateral: Spine Lumbar)  Patient Location: PACU  Anesthesia Type:General  Level of Consciousness: awake, drowsy, and patient cooperative  Airway & Oxygen Therapy: Patient Spontanous Breathing and Patient connected to nasal cannula oxygen  Post-op Assessment: Report given to RN and Post -op Vital signs reviewed and stable  Post vital signs: Reviewed and stable  Last Vitals:  Vitals Value Taken Time  BP 153/50 09/20/23 16:15  Temp 97.9 09/20/23 1616  Pulse 99 09/20/23 16:16  Resp 15 09/20/23 16:16  SpO2 95 % 09/20/23 16:16  Vitals shown include unfiled device data. Pt denies pain     Complications: No notable events documented.

## 2023-09-20 NOTE — Interval H&P Note (Signed)
 History and Physical Interval Note:  09/20/2023 12:50 PM  Caitlin Moran  has presented today for surgery, with the diagnosis of STENOSIS herniated nucleus pulposus.  The various methods of treatment have been discussed with the patient and family. After consideration of risks, benefits and other options for treatment, the patient has consented to  Procedure(s) with comments: DECOMPRESSIVE LUMBAR LAMINECTOMY LEVEL 1 (Bilateral) - Bilateral Hemi-laminotomy L4-5 Microdiscectomy as a surgical intervention.  The patient's history has been reviewed, patient examined, no change in status, stable for surgery.  I have reviewed the patient's chart and labs.  Questions were answered to the patient's satisfaction.     Reyes JAYSON Billing

## 2023-09-20 NOTE — Anesthesia Procedure Notes (Addendum)
 Procedure Name: Intubation Date/Time: 09/20/2023 1:30 PM  Performed by: Oley Aleck LABOR, CRNAPre-anesthesia Checklist: Patient identified, Emergency Drugs available, Suction available and Patient being monitored Patient Re-evaluated:Patient Re-evaluated prior to induction Oxygen Delivery Method: Circle system utilized Preoxygenation: Pre-oxygenation with 100% oxygen Induction Type: IV induction Ventilation: Mask ventilation without difficulty Laryngoscope Size: Mac and 3 Grade View: Grade I Tube type: Oral Tube size: 7.0 mm Number of attempts: 1 Airway Equipment and Method: Stylet Placement Confirmation: ETT inserted through vocal cords under direct vision, positive ETCO2 and breath sounds checked- equal and bilateral Secured at: 22 cm Tube secured with: Tape Dental Injury: Teeth and Oropharynx as per pre-operative assessment  Comments: Intubated by c. Oley, Scientist, clinical (histocompatibility and immunogenetics); ebbs

## 2023-09-20 NOTE — Brief Op Note (Signed)
 09/20/2023  12:50 PM  PATIENT:  Caitlin Moran  63 y.o. female  PRE-OPERATIVE DIAGNOSIS:  STENOSIS herniated nucleus pulposus  POST-OPERATIVE DIAGNOSIS:  * No post-op diagnosis entered *  PROCEDURE:  Procedure(s) with comments: DECOMPRESSIVE LUMBAR LAMINECTOMY LEVEL 1 (Bilateral) - Bilateral Hemi-laminotomy L4-5 Microdiscectomy  SURGEON:  Surgeons and Role:    DEWAINE Duwayne Purchase, MD - Primary  PHYSICIAN ASSISTANT:   ASSISTANTS: Bissell   ANESTHESIA:   general  EBL:  50   BLOOD ADMINISTERED:none  DRAINS: none   LOCAL MEDICATIONS USED:  MARCAINE      SPECIMEN:  No Specimen  DISPOSITION OF SPECIMEN:  N/A  COUNTS:  YES  TOURNIQUET:  * No tourniquets in log *  DICTATION: .Other Dictation: Dictation Number [id number]   PLAN OF CARE: Admit for overnight observation  PATIENT DISPOSITION:  PACU - hemodynamically stable.   Delay start of Pharmacological VTE agent (>24hrs) due to surgical blood loss or risk of bleeding: yes

## 2023-09-21 ENCOUNTER — Encounter (HOSPITAL_COMMUNITY): Payer: Self-pay | Admitting: Specialist

## 2023-09-21 DIAGNOSIS — M48061 Spinal stenosis, lumbar region without neurogenic claudication: Secondary | ICD-10-CM | POA: Diagnosis not present

## 2023-09-21 LAB — BASIC METABOLIC PANEL WITH GFR
Anion gap: 15 (ref 5–15)
BUN: 13 mg/dL (ref 8–23)
CO2: 23 mmol/L (ref 22–32)
Calcium: 9.6 mg/dL (ref 8.9–10.3)
Chloride: 101 mmol/L (ref 98–111)
Creatinine, Ser: 0.75 mg/dL (ref 0.44–1.00)
GFR, Estimated: >60 mL/min (ref >60)
Glucose, Bld: 144 mg/dL — ABNORMAL HIGH (ref 70–99)
Potassium: 4.1 mmol/L (ref 3.5–5.1)
Sodium: 139 mmol/L (ref 135–145)

## 2023-09-21 MED ORDER — DOCUSATE SODIUM 100 MG PO CAPS
100.0000 mg | ORAL_CAPSULE | Freq: Two times a day (BID) | ORAL | 2 refills | Status: AC
Start: 2023-09-21 — End: 2024-09-20

## 2023-09-21 MED ORDER — OXYCODONE HCL 5 MG PO TABS: 5.0000 mg | ORAL_TABLET | ORAL | 0 refills | Status: DC | PRN

## 2023-09-21 MED ORDER — POLYETHYLENE GLYCOL 3350 17 G PO PACK: 17.0000 g | PACK | Freq: Every day | ORAL | 0 refills | Status: AC

## 2023-09-21 MED ORDER — METHOCARBAMOL 500 MG PO TABS: 500.0000 mg | ORAL_TABLET | Freq: Three times a day (TID) | ORAL | 1 refills | Status: AC | PRN

## 2023-09-21 NOTE — Progress Notes (Signed)
 Subjective: 1 Day Post-Op Procedure(s) (LRB): DECOMPRESSIVE LUMBAR LAMINECTOMY Bilateral Hemi-laminotomy Lumbar four-five Microdiscectomy (Bilateral) Patient reports pain as moderate.  Reports incisional pain. Leg symptoms improved. Voiding without difficulty. No other c/o.  Objective: Vital signs in last 24 hours: Temp:  [96.8 F (36 C)-98.5 F (36.9 C)] 97.8 F (36.6 C) (07/11 0721) Pulse Rate:  [65-101] 66 (07/11 0721) Resp:  [10-27] 20 (07/11 0721) BP: (111-143)/(56-85) 119/69 (07/11 0721) SpO2:  [89 %-99 %] 99 % (07/11 0721) Weight:  [86.2 kg] 86.2 kg (07/10 1145)  Intake/Output from previous day: 07/10 0701 - 07/11 0700 In: 3930 [P.O.:480; I.V.:3000; IV Piggyback:450] Out: 1250 [Urine:1175; Blood:75] Intake/Output this shift: No intake/output data recorded.  No results for input(s): HGB in the last 72 hours. No results for input(s): WBC, RBC, HCT, PLT in the last 72 hours. No results for input(s): NA, K, CL, CO2, BUN, CREATININE, GLUCOSE, CALCIUM  in the last 72 hours. No results for input(s): LABPT, INR in the last 72 hours.  Neurologically intact ABD soft Neurovascular intact Sensation intact distally Intact pulses distally Dorsiflexion/Plantar flexion intact Incision: dressing C/D/I and no drainage No cellulitis present Compartment soft No calf pain or sign of DVT   Assessment/Plan: 1 Day Post-Op Procedure(s) (LRB): DECOMPRESSIVE LUMBAR LAMINECTOMY Bilateral Hemi-laminotomy Lumbar four-five Microdiscectomy (Bilateral) Advance diet Up with therapy D/C IV fluids D/C to home today Discussed dressing and D/C instructions, Lspine precautions   Ravinder Hofland M Charelle Petrakis 09/21/2023, 7:54 AM

## 2023-09-21 NOTE — Progress Notes (Signed)
 Patient alert and oriented, mae's well, voiding adequate amount of urine, swallowing without difficulty, no c/o pain at time of discharge. Patient discharged home with family. Script and discharged instructions given to patient. Patient and family stated understanding of instructions given. Room was checked and accounted for all patient's belongings; discharge instructions concerning her medications, incision care, follow up appointment and when to call the doctor as needed were all discussed with patient by RN and she expressed understanding on the instructions given.

## 2023-09-21 NOTE — Discharge Summary (Signed)
 Physician Discharge Summary   Patient ID: Caitlin Moran MRN: [id number] DOB/AGE: [birth year] 62 y.o.  Admit date: 09/20/2023 Discharge date: 09/21/2023  Primary Diagnosis:   STENOSIS herniated nucleus pulposus  Admission Diagnoses:  Past Medical History:  Diagnosis Date   Anxiety    Arthritis of neck    Depression    GERD (gastroesophageal reflux disease)    Headache    History of kidney stones    a.) s/p multiple ESWLs in the past   Hyperlipidemia    IBS (irritable bowel syndrome)    OSA on CPAP    Polycythemia vera (HCC)    ruled out   PONV (postoperative nausea and vomiting)    Didn't go to sleep all the way  during colonoscopy.   Psoriatic arthritis (HCC)    Spinal stenosis    Discharge Diagnoses:   Principal Problem:   Spinal stenosis at L4-L5 level  Procedure:  Procedure(s) (LRB): DECOMPRESSIVE LUMBAR LAMINECTOMY Bilateral Hemi-laminotomy Lumbar four-five Microdiscectomy (Bilateral)   Consults: None  HPI:  See H&P    Laboratory Data: Hospital Outpatient Visit on 09/12/2023  Component Date Value Ref Range Status   MRSA, PCR 09/12/2023 INVALID, UNABLE TO DETERMINE THE PRESENCE OF TARGET DUE TO SPECIMEN INTEGRITY. RECOLLECTION REQUESTED. (A)  NEGATIVE Corrected   Staphylococcus aureus 09/12/2023 INVALID, UNABLE TO DETERMINE THE PRESENCE OF TARGET DUE TO SPECIMEN INTEGRITY. RECOLLECTION REQUESTED. (A)  NEGATIVE Corrected   Comment: Performed at [facility], [address], [place], [place] [address] CORRECTED ON 07/02 AT 1929: PREVIOUSLY REPORTED AS INVALID, UNABLE TO DETERMINE THE PRESENCE OF TARGET DUE TO SPECIMEN INTEGRITY. RECOLLECTION REQUESTED. emailed lisa hodgin on 09/12/23 @ 1928 by drt    WBC 09/12/2023 10.8 (H)  4.0 - 10.5 K/uL Final   RBC 09/12/2023 4.85  3.87 - 5.11 MIL/uL Final   Hemoglobin 09/12/2023 14.7  12.0 - 15.0 g/dL Final   HCT 92/97/7974 44.6  36.0 - 46.0 % Final   MCV 09/12/2023 92.0  80.0 - 100.0 fL Final   MCH 09/12/2023  30.3  26.0 - 34.0 pg Final   MCHC 09/12/2023 33.0  30.0 - 36.0 g/dL Final   RDW 92/97/7974 12.2  11.5 - 15.5 % Final   Platelets 09/12/2023 235  150 - 400 K/uL Final   nRBC 09/12/2023 0.0  0.0 - 0.2 % Final   Performed at [facility], [address], [place], [place] [address]   Sodium 09/12/2023 139  135 - 145 mmol/L Final   Potassium 09/12/2023 4.1  3.5 - 5.1 mmol/L Final   Chloride 09/12/2023 104  98 - 111 mmol/L Final   CO2 09/12/2023 28  22 - 32 mmol/L Final   Glucose, Bld 09/12/2023 86  70 - 99 mg/dL Final   Glucose reference range applies only to samples taken after fasting for at least 8 hours.   BUN 09/12/2023 19  8 - 23 mg/dL Final   Creatinine, Ser 09/12/2023 0.92  0.44 - 1.00 mg/dL Final   Calcium  09/12/2023 9.5  8.9 - 10.3 mg/dL Final   GFR, Estimated 09/12/2023 >60  >60 mL/min Final   Comment: (NOTE) Calculated using the CKD-EPI Creatinine Equation (2021)    Anion gap 09/12/2023 7  5 - 15 Final   Performed at [facility], [address], [place], [place] [address]   No results for input(s): HGB in the last 72 hours. No results for input(s): WBC, RBC, HCT, PLT in the last 72 hours. No results for  input(s): NA, K, CL, CO2, BUN, CREATININE, GLUCOSE, CALCIUM  in the last 72 hours. No results for input(s): LABPT, INR in the last 72 hours.  X-Rays:DG Lumbar Spine 2-3 Views Result Date: 09/20/2023 CLINICAL DATA:  [id number] Surgery, elective [id number] EXAM: LUMBAR SPINE - 2-3 VIEW COMPARISON:  None Available. FINDINGS: The provided image demonstrates external markers pointing towards the lower lumbar spine. The last image demonstrates external marker pointing at the superior endplate of L5 vertebrae. Please refer to the separately issued operative report for further details. IMPRESSION: Intraoperative fluoroscopic guidance, as described above. Electronically Signed   By: Ree Molt M.D.   On: 09/20/2023 16:33   DG Lumbar Spine 2-3  Views Result Date: 09/12/2023 CLINICAL DATA:  [id number] HNP (herniated nucleus pulposus), lumbar [id number] EXAM: LUMBAR SPINE - 2-3 VIEW COMPARISON:  October 28, 2020, Aug 03, 2022 FINDINGS: Osteopenia. Five non rib-bearing lumbar type vertebral bodies. Mild straightening of the normal lumbar lordosis, which may be due to muscular spasm. Vertebral body heights are well maintained without acute fracture. Mild height loss of the L3-L4 intervertebral disc with osteophyte formation. Apparent neuroforaminal stenosis present within the L3-L4, L4-L5, and L5-S1 disc spaces. Cholecystectomy clips. Scattered atherosclerosis. IMPRESSION: 1. Mild straightening of the normal lumbar lordosis, which may be due to muscular spasm. No acute fracture or malalignment of the lumbar spine. 2. Mild disc disease at L3-L4 with apparent neuroforaminal stenosis at L3-L4, L4-L5, and L5-S1. Electronically Signed   By: Rogelia Myers M.D.   On: 09/12/2023 15:25    EKG:No orders found for this or any previous visit.   Hospital Course: Patient was admitted to [facility] and taken to the OR and underwent the above state procedure without complications.  Patient tolerated the procedure well and was later transferred to the recovery room and then to the orthopaedic floor for postoperative care.  They were given PO and IV analgesics for pain control following their surgery.  They were given 24 hours of postoperative antibiotics.   PT was consulted postop to assist with mobility and transfers.  The patient was allowed to be WBAT with therapy and was taught back precautions. Discharge planning was consulted to help with postop disposition and equipment needs.  Patient had a fair night on the evening of surgery and started to get up OOB with therapy on day one. Patient was seen in rounds and was ready to go home on day one.  They were given discharge instructions and dressing directions.  They were instructed on when to follow up in the office  with Dr. Duwayne.   Diet: Regular diet Activity:WBAT, LSpine precautions Follow-up:in 10-14 days Disposition - Home Discharged Condition: good   Discharge Instructions     Call MD / Call 911   Complete by: As directed    If you experience chest pain or shortness of breath, CALL 911 and be transported to the hospital emergency room.  If you develope a fever above 101 F, pus (white drainage) or increased drainage or redness at the wound, or calf pain, call your surgeon's office.   Constipation Prevention   Complete by: As directed    Drink plenty of fluids.  Prune juice may be helpful.  You may use a stool softener, such as Colace (over the counter) 100 mg twice a day.  Use MiraLax  (over the counter) for constipation as needed.   Diet - low sodium heart healthy   Complete by: As directed    Increase activity slowly as tolerated  Complete by: As directed    Post-operative opioid taper instructions:   Complete by: As directed    POST-OPERATIVE OPIOID TAPER INSTRUCTIONS: It is important to wean off of your opioid medication as soon as possible. If you do not need pain medication after your surgery it is ok to stop day one. Opioids include: Codeine, Hydrocodone (Norco, Vicodin), Oxycodone (Percocet, oxycontin ) and hydromorphone  amongst others.  Long term and even short term use of opiods can cause: Increased pain response Dependence Constipation Depression Respiratory depression And more.  Withdrawal symptoms can include Flu like symptoms Nausea, vomiting And more Techniques to manage these symptoms Hydrate well Eat regular healthy meals Stay active Use relaxation techniques(deep breathing, meditating, yoga) Do Not substitute Alcohol to help with tapering If you have been on opioids for less than two weeks and do not have pain than it is ok to stop all together.  Plan to wean off of opioids This plan should start within one week post op of your joint replacement. Maintain the  same interval or time between taking each dose and first decrease the dose.  Cut the total daily intake of opioids by one tablet each day Next start to increase the time between doses. The last dose that should be eliminated is the evening dose.         Allergies as of 09/21/2023   No Known Allergies      Medication List     STOP taking these medications    aspirin EC 81 MG tablet   celecoxib 200 MG capsule Commonly known as: CELEBREX   HYDROcodone -acetaminophen  10-325 MG tablet Commonly known as: NORCO       TAKE these medications    CAL MAG ZINC +D3 PO Take 1 tablet by mouth daily.   clobetasol cream 0.05 % Commonly known as: TEMOVATE Apply 1 application topically daily as needed (for psoriasis).   docusate sodium  100 MG capsule Commonly known as: Colace Take 1 capsule (100 mg total) by mouth 2 (two) times daily.   Golimumab 50 MG/0.5ML Soaj Inject 50 mg into the skin every 30 (thirty) days.   methocarbamol  500 MG tablet Commonly known as: ROBAXIN  Take 500 mg by mouth every 8 (eight) hours as needed for muscle spasms. What changed: Another medication with the same name was added. Make sure you understand how and when to take each.   methocarbamol  500 MG tablet Commonly known as: ROBAXIN  Take 1 tablet (500 mg total) by mouth every 8 (eight) hours as needed for muscle spasms. What changed: You were already taking a medication with the same name, and this prescription was added. Make sure you understand how and when to take each.   Mounjaro  15 MG/0.5ML Pen Generic drug: tirzepatide  Inject 15 mg into the skin every Thursday.   omeprazole  20 MG tablet Commonly known as: PRILOSEC  OTC Take 20 mg by mouth daily.   oxyCODONE  5 MG immediate release tablet Commonly known as: Oxy IR/ROXICODONE  Take 1 tablet (5 mg total) by mouth every 4 (four) hours as needed for severe pain (pain score 7-10).   polyethylene glycol 17 g packet Commonly known as: MIRALAX  /  GLYCOLAX  Take 17 g by mouth daily.   rosuvastatin  40 MG tablet Commonly known as: CRESTOR  Take 40 mg by mouth daily.   Vitamin D 50 MCG (2000 UT) Caps Take 2,000 Units by mouth daily.   vortioxetine  HBr 20 MG Tabs tablet Commonly known as: TRINTELLIX  Take 20 mg by mouth daily.  Follow-up Information     Duwayne Purchase, MD Follow up in 2 week(s).   Specialty: Orthopedic Surgery Contact information: [address] [place] [address] [phone]                 Signed: Kelbie Moro, PA-C Orthopaedic Surgery 09/21/2023, 7:57 AM

## 2023-09-21 NOTE — Anesthesia Postprocedure Evaluation (Addendum)
 Anesthesia Post Note  Patient: Georganne Siple  Procedure(s) Performed: DECOMPRESSIVE LUMBAR LAMINECTOMY Bilateral Hemi-laminotomy Lumbar four-five Microdiscectomy (Bilateral: Spine Lumbar)     Patient location during evaluation: PACU Anesthesia Type: General Level of consciousness: awake and alert Pain management: pain level controlled Vital Signs Assessment: post-procedure vital signs reviewed and stable Respiratory status: spontaneous breathing, nonlabored ventilation, respiratory function stable and patient connected to nasal cannula oxygen Cardiovascular status: blood pressure returned to baseline and stable Postop Assessment: no apparent nausea or vomiting Anesthetic complications: no   There were no known notable events for this encounter.              Naara Kelty D Rayder Sullenger

## 2023-09-21 NOTE — Progress Notes (Signed)
 PT Cancellation Note  Patient Details Name: Caitlin Moran MRN: [id number] DOB: 1960/10/07   Cancelled Treatment:    Reason Eval/Treat Not Completed: PT screened, no needs identified, will sign off (OT evaluated; pt modI)  Aleck Daring, PT, DPT Acute Rehabilitation Services Office [phone]    [name] 09/21/2023, 9:00 AM

## 2023-09-21 NOTE — Op Note (Signed)
 Caitlin Moran, RAIMER MEDICAL RECORD NO: [id number] ACCOUNT NO: [id number] DATE OF BIRTH: July 06, 1960 FACILITY: [facility] LOCATION: [facility] PHYSICIAN: Caitlin Moran Billing, MD  Operative Report   DATE OF PROCEDURE: 09/20/2023  PREOPERATIVE DIAGNOSES: 1.  Spinal stenosis. 2.  HNP at L4-L5.  POSTOPERATIVE DIAGNOSES: 1.  Spinal stenosis. 2.  HNP at L4-L5.  PROCEDURES PERFORMED: 1.  Bilateral hemilaminotomies, medial phase partial medial hemifacetectomies with foraminotomies at L5. 2.  Microdiscectomy L5-S1 right. 3.  Loss of epidural venous plexus L4-L5.  ANESTHESIA:  General.  ASSISTANT:  Caitlin Bissell, PA.  HISTORY: The patient is a 63 year old with bilateral lower extremity radicular pain in L5 nerve root distribution, EHL weakness on the right.  Radicular pain on the left.  Due to spinal stenosis at L4-L5 and a disc herniation at L4-L5 to the right.  The patient  has asymptomatic L4 nerve root encroachment at L3-L4 on the left.  The patient was indicated for decompression at L4-L5.  Risks and benefits were discussed including bleeding, infection, damage to neurovascular structures, no change in symptoms, or  symptoms of DVT, PE, anesthetic complications, etc.  DESCRIPTION OF PROCEDURE:  The patient was placed in the supine position.  After induction of adequate general anesthesia, 2 g of Kefzol , she was placed supine on the Wilson frame.  All bony prominences were well padded.  Lumbar regions were prepped and  draped in the usual sterile fashion.  Two 18-gauge spinal needle was utilized to localize the L4-L5 interspace confirmed with x-ray.  Incision was made from the spinous process of L4 to L5.  Subcutaneous tissue was dissected.  Electrocautery was utilized  to achieve hemostasis.  Dorsal lumbar fascia divided in line with the skin incision.  Paraspinous muscles elevated from the lamina L4-L5 bilaterally.  McCulloch retractor placed.  Operating microscope was draped and brought into the  surgical field.   Confirmatory radiographs obtained at L4-L5.  A very small interlaminar window with shingling at L4-L5 precluded separate hemilaminotomies.  I used a Leksell rongeur to remove a portion of the spinal process of L4 and L5 in the interspinous ligament.   Following this, I used a 2-3 mm Kerrison cephalad to remove ligamentum flavum, to remove hemilaminotomy of the caudate edge of L4.  I used a Woodson probe for protection of the neural elements.  We continued cephalad to the point where we detached the  ligamentum flavum.  I used a straight curette to detach the ligamentum flavum from the cephalad edge of L5.  Essentially, I entered the epidural space, protected the dura with a Woodson, began removing hypertrophic ligamentum essentially first left and  then right.  Significant ligamentum flavum hypertrophy was noted and facet hypertrophy.  I then used a retractor at the lateral aspect of the thecal sac, decompressed the lateral recess to the medial border of the pedicle.  I protected the L5 nerve root.   Performed a foraminotomy of L5.  Hypertrophic vascular plexus was noted on the left at L4-L5.  We cauterized that, lysed that individually and recauterized that, covered it with Gelfoam and a neural patty.  The foramen of L4 was patent.  I turned  attention back to the right.  Decompressed the lateral recess to the medial border of the pedicle, gently mobilized the L5 root medially first.  I performed a foraminotomy of L5.  I decompressed the lateral recess in the medial border of the pedicle.   Partial medial hemifacetectomies bilaterally, less than 25% of the facets were removed.  Venous plexus was noted as well.  Bipolar electrocautery was utilized to achieve hemostasis.  Gently mobilized the thecal sac medially and focal HNP was noted at  L4-L5.  I performed an annulotomy and copious portion of the disc and material was removed from the disc space with a micropituitary, upper biting  micropituitary, further mobilized with catheter to vas and additional pituitary.  Following this, there was  adequate decompression of the L5 root with 1 cm of excursion of the L5 root medial pedicle without tension.  I checked beneath the thecal sac, the axilla of the shoulder of the root out the foramen of L4 and L5, widely patent without evidence of  residual disc herniation.  A Woodson probe was then passed above the pedicle of L4 and below the pedicle of L5 without tension.  Confirmatory radiographs obtained at the disc space.  I placed bone wax in the cancellous surfaces and rechecked the  contralateral side for the venous plexus that was cauterized.  No active bleeding.  Removed the McCulloch retractor, irrigated the paraspinous musculature.  Achieved strict hemostasis with bipolar cautery.  Thrombin  soaked Gelfoam was placed in the  laminotomy defect and removed.  No active bleeding or CSF leakage was noted and good restoration of the thecal sac.  I then closed the dorsal lumbar fascia with 1-0 Vicryl interrupted figure-of-eight sutures, subcutaneous with multiple 2-0 layers and  skin with subcuticular Prolene.  Sterile dressing applied.  The patient was placed supine on the hospital bed and extubated without difficulty and transported to the recovery room in satisfactory condition.  The patient tolerated the procedure well with no complications.   Assistant, Caitlin Bissell, PA was used throughout the case for patient positioning, gentle intermittent neural traction, suction and closure.   MUK D: 09/20/2023 4:04:57 pm T: 09/21/2023 1:20:00 am  JOB: [id number]/ [id number]

## 2023-09-21 NOTE — Plan of Care (Signed)

## 2023-09-21 NOTE — Evaluation (Signed)
 Occupational Therapy Evaluation Patient Details Name: Caitlin Moran MRN: [id number] DOB: 1960/10/29 Today's Date: 09/21/2023   History of Present Illness   Caitlin Moran  63 y.o. female who is s/p  Bilateral Hemi-laminotomy L4-5 Microdiscectomy 7/10. PMHx: anxiety, depression, GERD, HA, HLD, IBS, OSA on CPAP, PONV, spinal stenosis     Clinical Impressions Caitlin Moran was evaluated s/p the above spine surgery. She is indep at baseline. Upon evaluation pt was limited by surgical pain, spinal precautions and decreased activity tolerance. Overall she demosntrated mod I ability to complete ADLs and functional mobility. Provided cues and education on spinal precautions and compensatory techniques throughout, handout provided and pt demonstrated great recall during ADLs and mobility. Pt does not require further acute OT services. Recommend d/c home with support of family.       If plan is discharge home, recommend the following:   Assistance with cooking/housework;Assist for transportation     Functional Status Assessment   Patient has had a recent decline in their functional status and demonstrates the ability to make significant improvements in function in a reasonable and predictable amount of time.     Equipment Recommendations   None recommended by OT      Precautions/Restrictions   Precautions Precautions: Back Precaution Booklet Issued: Yes (comment) Recall of Precautions/Restrictions: Intact Restrictions Weight Bearing Restrictions Per Provider Order: No     Mobility Bed Mobility Overal bed mobility: Modified Independent      Transfers Overall transfer level: Modified independent              Balance Overall balance assessment: No apparent balance deficits (not formally assessed)               ADL either performed or assessed with clinical judgement   ADL Overall ADL's : Modified independent               General ADL Comments: pt  demonstrated mod i ability to complete ADLs after review of compensatory techniques. no DME required     Vision Baseline Vision/History: 0 No visual deficits Vision Assessment?: Wears glasses for reading;Wears glasses for driving     Perception Perception: Within Functional Limits       Praxis Praxis: WFL       Pertinent Vitals/Pain Pain Assessment Pain Assessment: Faces Faces Pain Scale: Hurts little more Pain Location: back Pain Descriptors / Indicators: Discomfort Pain Intervention(s): Limited activity within patient's tolerance, Monitored during session     Extremity/Trunk Assessment Upper Extremity Assessment Upper Extremity Assessment: Overall WFL for tasks assessed   Lower Extremity Assessment Lower Extremity Assessment: Overall WFL for tasks assessed   Cervical / Trunk Assessment Cervical / Trunk Assessment: Back Surgery   Communication Communication Communication: No apparent difficulties   Cognition Arousal: Alert Behavior During Therapy: WFL for tasks assessed/performed Cognition: No apparent impairments         Following commands: Intact       Cueing  General Comments      VSS           Home Living Family/patient expects to be discharged to:: Private residence Living Arrangements: Spouse/significant other;Non-relatives/Friends Available Help at Discharge: Family;Friend(s);Available PRN/intermittently Type of Home: House Home Access: Stairs to enter [employer] of Steps: 3 Entrance Stairs-Rails: Right Home Layout: Two level;Able to live on main level with bedroom/bathroom     Bathroom Shower/Tub: Arts development officer Toilet: Handicapped height     Home Equipment: None  Prior Functioning/Environment Prior Level of Function : Independent/Modified Independent             Mobility Comments: no AD ADLs Comments: indep, drives, works    OT Problem List: Decreased activity tolerance;Decreased  knowledge of use of DME or AE;Decreased knowledge of precautions        OT Goals(Current goals can be found in the care plan section)   Acute Rehab OT Goals Patient Stated Goal: home OT Goal Formulation: With patient Time For Goal Achievement: 09/21/23 Potential to Achieve Goals: Good   AM-PAC OT 6 Clicks Daily Activity     Outcome Measure Help from another person eating meals?: None Help from another person taking care of personal grooming?: None Help from another person toileting, which includes using toliet, bedpan, or urinal?: None Help from another person bathing (including washing, rinsing, drying)?: None Help from another person to put on and taking off regular upper body clothing?: None Help from another person to put on and taking off regular lower body clothing?: None 6 Click Score: 24   End of Session Nurse Communication: Mobility status  Activity Tolerance: Patient tolerated treatment well Patient left: in bed;with call bell/phone within reach  OT Visit Diagnosis: Unsteadiness on feet (R26.81);Pain                Time: 9169-9149 OT Time Calculation (min): 20 min Charges:  OT General Charges $OT Visit: 1 Visit OT Evaluation $OT Eval Low Complexity: 1 Low  Lucie Kendall, OTR/L Acute Rehabilitation Services Office [phone] Secure Chat Communication Preferred   Lucie JONETTA Kendall 09/21/2023, 9:42 AM

## 2023-09-26 ENCOUNTER — Inpatient Hospital Stay: Admission: RE | Admit: 2023-09-26 | Source: Ambulatory Visit

## 2023-10-19 ENCOUNTER — Ambulatory Visit
Admission: RE | Admit: 2023-10-19 | Discharge: 2023-10-19 | Disposition: A | Discharge status: Home / Self Care | Source: Ambulatory Visit | Attending: Obstetrics and Gynecology | Admitting: Obstetrics and Gynecology

## 2023-10-19 DIAGNOSIS — N9489 Other specified conditions associated with female genital organs and menstrual cycle: Secondary | ICD-10-CM

## 2023-10-19 MED ORDER — IOPAMIDOL (ISOVUE-300) INJECTION 61%
100.0000 mL | Freq: Once | INTRAVENOUS | Status: AC | PRN
  Administered 2023-10-19: 100 mL via INTRAVENOUS

## 2023-11-07 ENCOUNTER — Encounter: Payer: Self-pay | Admitting: Urology

## 2023-11-26 IMAGING — CT CT CARDIAC CORONARY ARTERY CALCIUM SCORE
3 series · 14 of 20 positions shown, 16 images · non-contrast
Comparison: None.

CLINICAL DATA: 60-year-old white female with hyperlipidemia,
unspecified hyperlipidemia type.

EXAM:
CT CARDIAC CORONARY ARTERY CALCIUM SCORE
TECHNIQUE: Non-contrast imaging through the heart was performed using
prospective ECG gating. Image post processing was performed on an
independent workstation, allowing for quantitative analysis of the
heart and coronary arteries. Note that this exam targets the heart
and the chest was not imaged in its entirety.

[Series 2: calcium scoring 2.00 qr36 bestdiast 71% hrt calciu · axial · 0.43mm/px · z∈[+1710,+1794]mm · 4 of 70 slices shown]
[im 14/70  vessel]
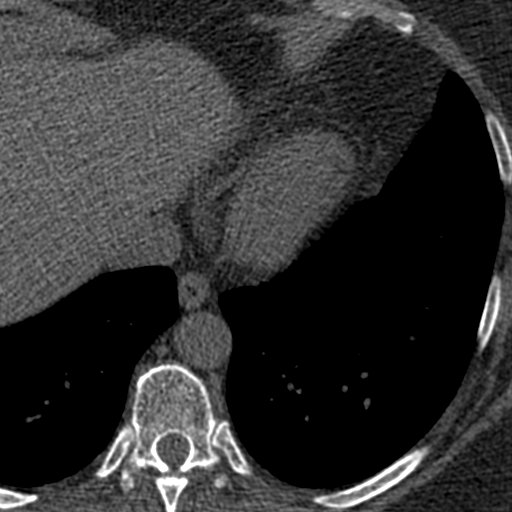
[im 28/70  vessel]
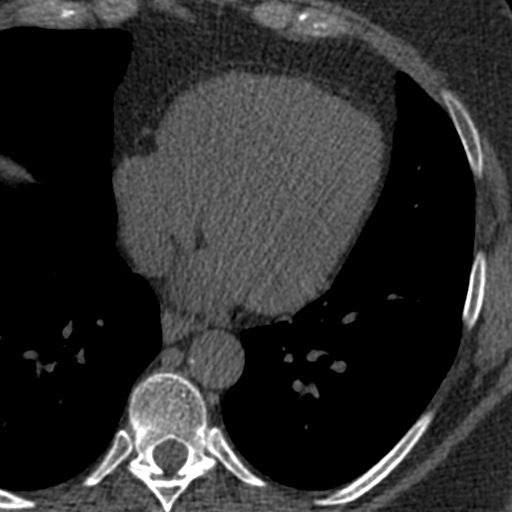
[im 42/70  vessel]
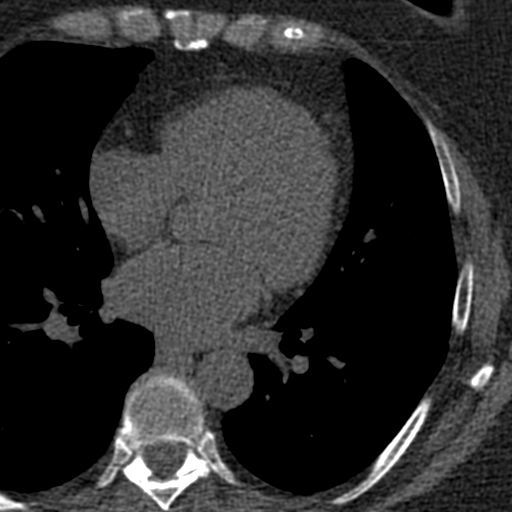
[im 56/70  vessel]
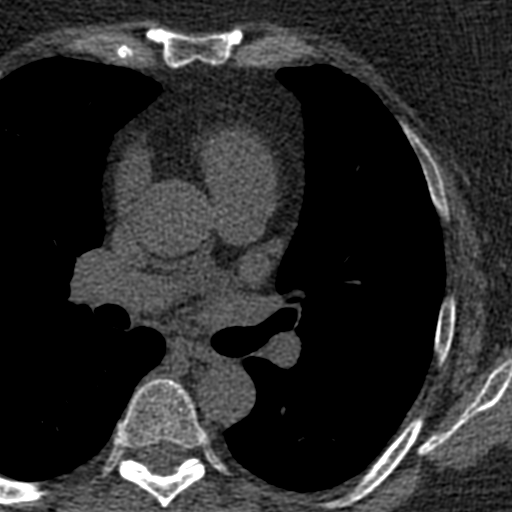

[Series 3: calcium scoring 2.00 br40 bestdiast 71% axial · axial · 0.68mm/px · z∈[+1706,+1798]mm · 5 of 70 slices shown, 7 images]
[im 12/70  vessel]
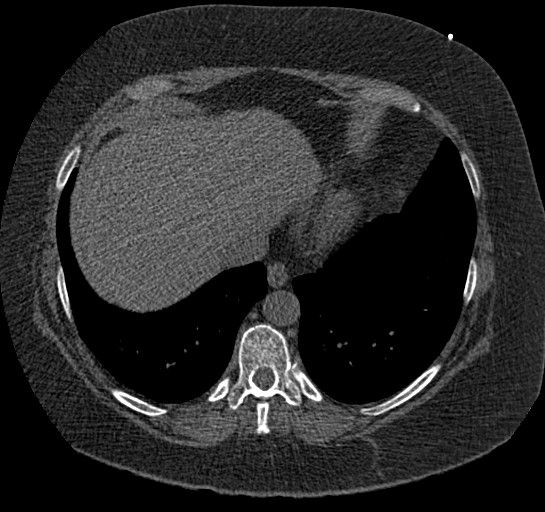
[im 12/70  lung]
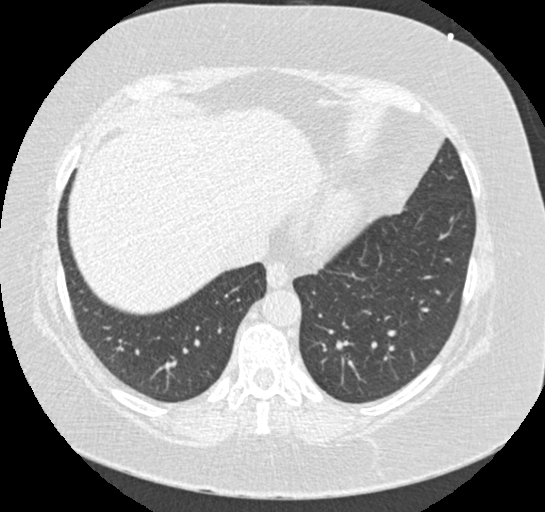
[im 24/70  vessel]
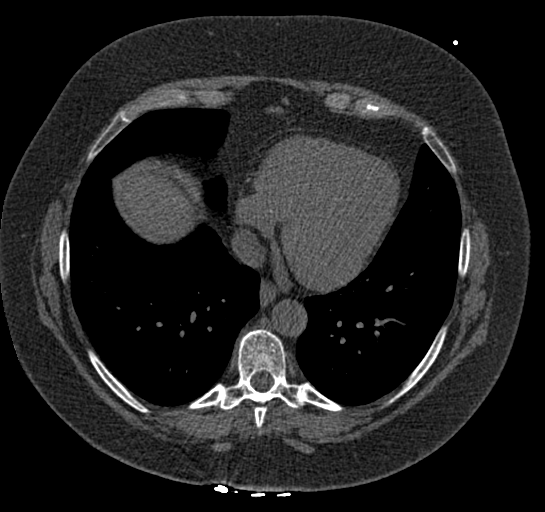
[im 35/70  vessel]
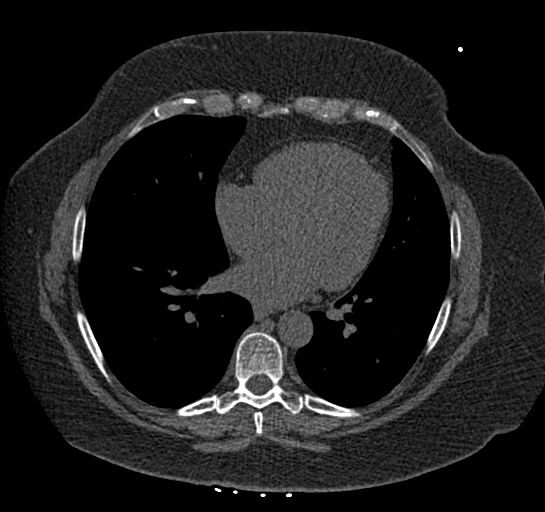
[im 47/70  vessel]
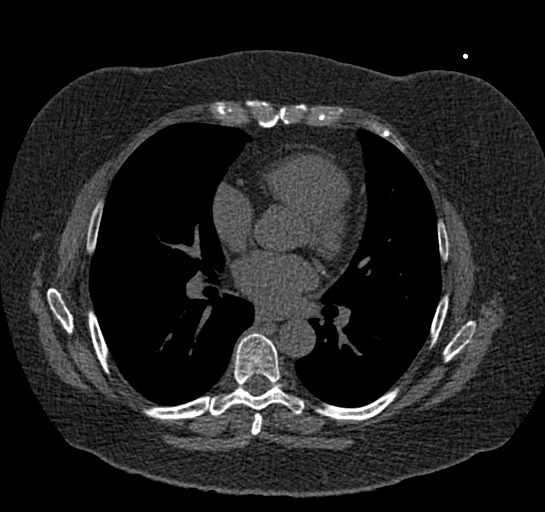
[im 58/70  vessel]
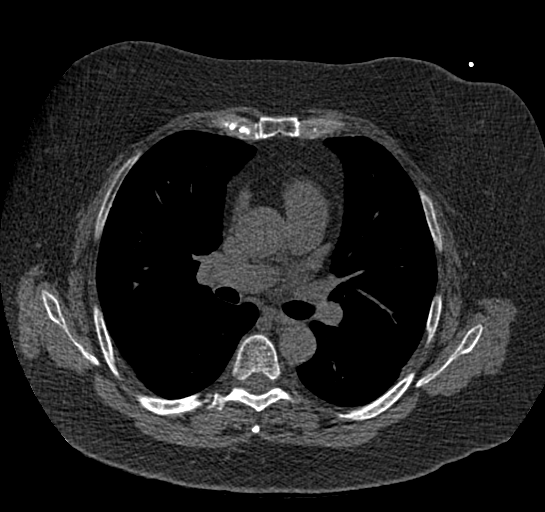
[im 58/70  lung]
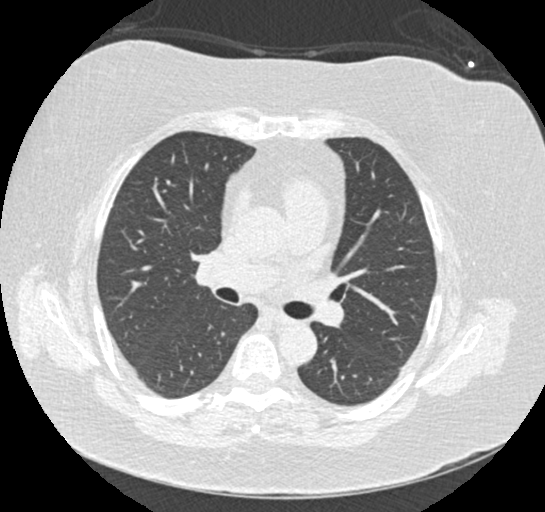

[Series 9: calcium scoring 2.00 br60 bestdiast 71% lungs · axial · 0.68mm/px · z∈[+1706,+1798]mm · 5 of 70 slices shown]
[im 12/70  vessel]
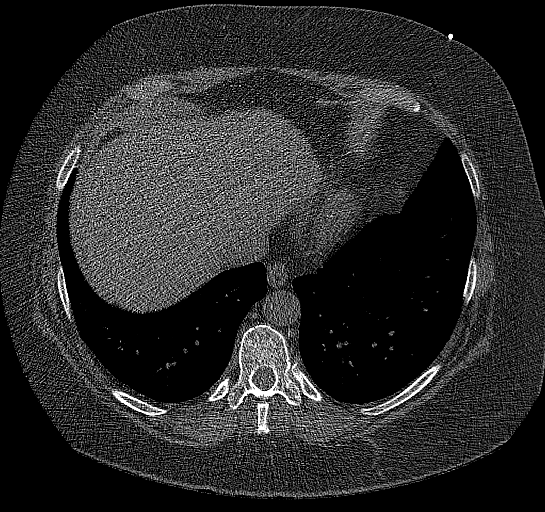
[im 24/70  vessel]
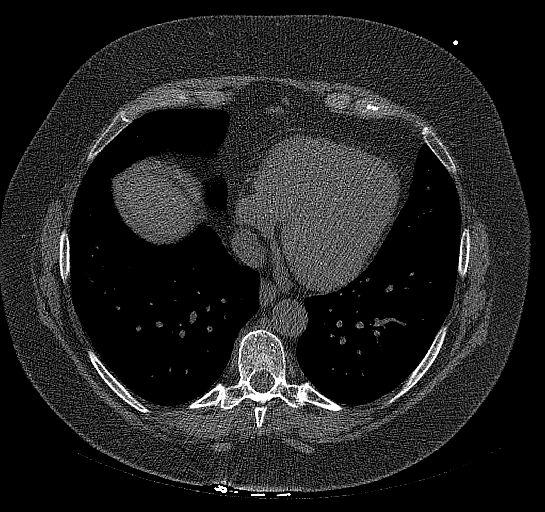
[im 35/70  vessel]
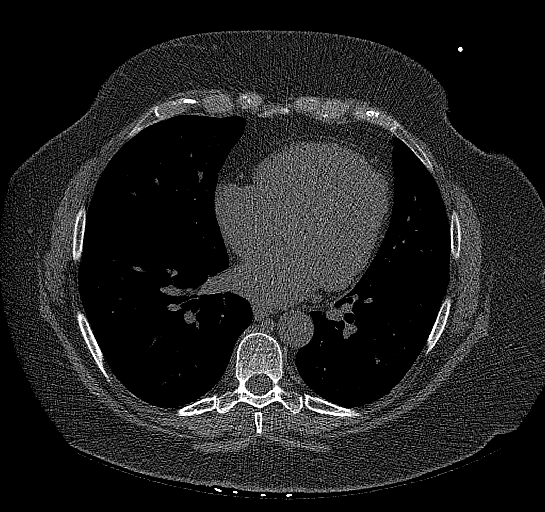
[im 47/70  vessel]
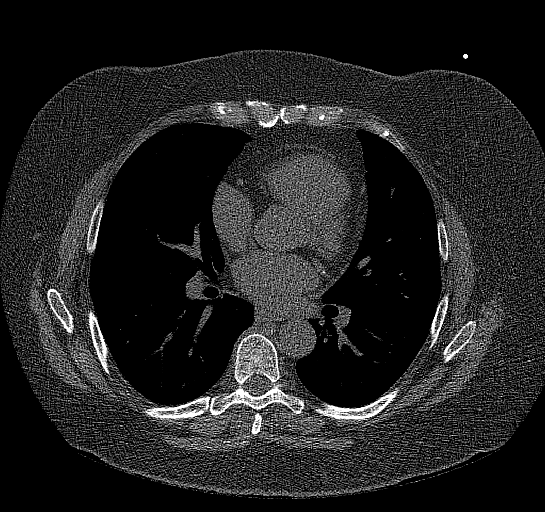
[im 58/70  vessel]
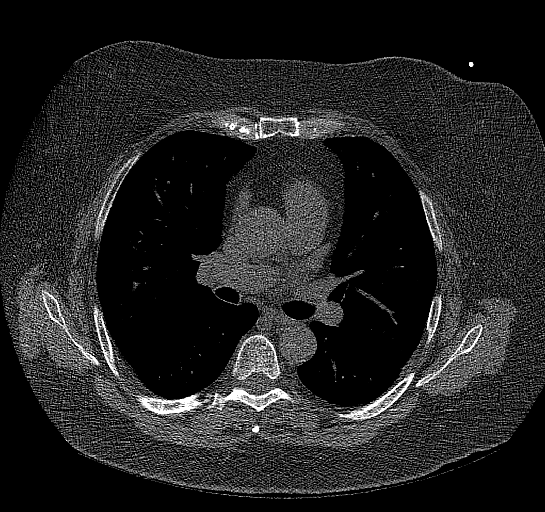

[14 of 20 positions shown; findings below may reference images not displayed]

FINDINGS: CORONARY CALCIUM SCORES:

Left Main: 0

LAD:

LCx:

RCA: 0

Total Agatston Score:

[HOSPITAL] percentile: 70

AORTA MEASUREMENTS:

Ascending Aorta: 38 mm

Descending Aorta: 27 mm

OTHER FINDINGS:

Heart size is normal. No significant pericardial fluid. Visualized
mediastinal structures are normal. Images of the upper abdomen are
unremarkable. Visualized lungs are clear without large pleural
effusions. No acute bone abnormality.
IMPRESSION: Coronary calcium score is 5.05 and this is at percentile 70 for
patients of the same age, gender and ethnicity.

## 2023-12-13 ENCOUNTER — Telehealth: Payer: Self-pay | Admitting: Family Medicine

## 2023-12-13 NOTE — Telephone Encounter (Signed)
 MYC cxl

## 2024-01-28 DIAGNOSIS — N200 Calculus of kidney: Secondary | ICD-10-CM | POA: Insufficient documentation

## 2024-01-28 NOTE — Assessment & Plan Note (Deleted)
 s/p Right URS (6mm ureteral stone) in Oct 2024   - CaOx 75% mono, 20 di, 5% Phos  CT A/P (Aug 2025) - punctate RLP stone only  Today we reviewed the basic tenets of kidney stone management and prevention. We reviewed five broad recommendations:   - Adequate fluid intake >=2.5 L/day (goal urine output >=2 L/day). - Maintenance of normal dietary calcium  (1,000-1,200 mg/day), avoid excess calcium  supplements - Reduction of sodium intake (<2 g/day) to lower urinary calcium  excretion. - Moderation of oxalate-rich foods (nuts, spinach, beets, chocolate, tea)  - Limit animal protein (meat, fish, poultry); promote balanced diet with fruits/vegetables.

## 2024-01-28 NOTE — Progress Notes (Deleted)
   01/31/2024 8:01 AM   Caitlin Moran 1960/10/12 [id number]  Reason for visit: Follow up nephrolithiasis, [facility]   HPI: 63 y.o. female, initial follow up with me today, previously seen by Dr. Penne in Nov 2024  Prior HPI: Hx of nephrolithiasis   - s/p Right URS (6mm ureteral stone) in Oct 2024     Physical Exam: LMP 09/24/2012    Constitutional:  Alert and oriented, No acute distress.  Laboratory Data: Calcium  Oxalate Monohydrate 75 VC  Calcium  Oxalate Dihydrate 20 VC  Calcium  Phosphate (Hydroxyl) 5 VC    Pertinent Imaging: I have personally viewed and interpreted the CT A/P (Aug 2025) -slightly full right renal pelvis, possible extrarenal pelvis.  Punctate RLP stone.  No left nephrolithiasis.  Kidneys appear morphologically normal otherwise.  Normal bilateral ureters and bladder.    Assessment & Plan:    Nephrolithiasis Assessment & Plan: s/p Right URS (6mm ureteral stone) in Oct 2024   - CaOx 75% mono, 20 di, 5% Phos  CT A/P (Aug 2025) - punctate RLP stone only  Today we reviewed the basic tenets of kidney stone management and prevention. We reviewed five broad recommendations:   - Adequate fluid intake >=2.5 L/day (goal urine output >=2 L/day). - Maintenance of normal dietary calcium  (1,000-1,200 mg/day), avoid excess calcium  supplements - Reduction of sodium intake (<2 g/day) to lower urinary calcium  excretion. - Moderation of oxalate-rich foods (nuts, spinach, beets, chocolate, tea)  - Limit animal protein (meat, fish, poultry); promote balanced diet with fruits/vegetables.         Penne JONELLE Skye, MD  [facility] [address] [place], [place] [address] [phone]

## 2024-01-31 ENCOUNTER — Ambulatory Visit: Admitting: Urology

## 2024-01-31 DIAGNOSIS — N200 Calculus of kidney: Secondary | ICD-10-CM

## 2024-02-05 ENCOUNTER — Ambulatory Visit: Payer: Self-pay | Admitting: Urology

## 2024-02-11 NOTE — Progress Notes (Deleted)
   02/14/2024 9:02 AM   Caitlin Moran 1960/05/02 [id number]  Reason for visit: Follow up nephrolithiasis, [facility]   HPI: 63 y.o. female, initial follow up with me today, previously seen by Dr. Penne in Nov 2024  Prior HPI: Hx of nephrolithiasis   - s/p Right URS (6mm ureteral stone) in Oct 2024     Physical Exam: LMP 09/24/2012    Constitutional:  Alert and oriented, No acute distress.  Laboratory Data: Calcium  Oxalate Monohydrate 75 VC  Calcium  Oxalate Dihydrate 20 VC  Calcium  Phosphate (Hydroxyl) 5 VC    Pertinent Imaging: I have personally viewed and interpreted the CT A/P (Aug 2025) -slightly full right renal pelvis, possible extrarenal pelvis.  Punctate RLP stone.  No left nephrolithiasis.  Kidneys appear morphologically normal otherwise.  Normal bilateral ureters and bladder.    Assessment & Plan:    Nephrolithiasis Assessment & Plan: s/p Right URS (6mm ureteral stone) in Oct 2024   - CaOx 75% mono, 20 di, 5% Phos  CT A/P (Aug 2025) - punctate RLP stone only  Today we reviewed the basic tenets of kidney stone management and prevention. We reviewed five broad recommendations:   - Adequate fluid intake >=2.5 L/day (goal urine output >=2 L/day). - Maintenance of normal dietary calcium  (1,000-1,200 mg/day), avoid excess calcium  supplements - Reduction of sodium intake (<2 g/day) to lower urinary calcium  excretion. - Moderation of oxalate-rich foods (nuts, spinach, beets, chocolate, tea)  - Limit animal protein (meat, fish, poultry); promote balanced diet with fruits/vegetables.          Penne JONELLE Skye, MD  [facility] [address], [address] [place], [place] [address] [phone]

## 2024-02-11 NOTE — Assessment & Plan Note (Deleted)
 s/p Right URS (6mm ureteral stone) in Oct 2024   - CaOx 75% mono, 20 di, 5% Phos  CT A/P (Aug 2025) - punctate RLP stone only  Today we reviewed the basic tenets of kidney stone management and prevention. We reviewed five broad recommendations:   - Adequate fluid intake >=2.5 L/day (goal urine output >=2 L/day). - Maintenance of normal dietary calcium  (1,000-1,200 mg/day), avoid excess calcium  supplements - Reduction of sodium intake (<2 g/day) to lower urinary calcium  excretion. - Moderation of oxalate-rich foods (nuts, spinach, beets, chocolate, tea)  - Limit animal protein (meat, fish, poultry); promote balanced diet with fruits/vegetables.

## 2024-02-12 ENCOUNTER — Telehealth: Payer: Self-pay | Admitting: Urology

## 2024-02-12 NOTE — Progress Notes (Unsigned)
   02/14/2024 9:36 AM   Caitlin Moran April 30, 1960 [id number]  Reason for visit: Follow up nephrolithiasis, [facility]   HPI: 63 y.o. female, initial follow up with me today, previously seen by Dr. Penne in Nov 2024  Prior HPI: Hx of nephrolithiasis   - s/p Right URS (6mm ureteral stone) in Oct 2024   - s/p ESWL 10-15 years ago    Physical Exam: BP 132/82   Pulse 74   Ht 5' 7 (1.702 m)   Wt 190 lb (86.2 kg)   LMP 09/24/2012   BMI 29.76 kg/m    Constitutional:  Alert and oriented, No acute distress.  Laboratory Data: Calcium  Oxalate Monohydrate 75 VC  Calcium  Oxalate Dihydrate 20 VC  Calcium  Phosphate (Hydroxyl) 5 VC    Pertinent Imaging: I have personally viewed and interpreted the CT A/P (Aug 2025) -slightly full right renal pelvis, possible extrarenal pelvis.  Punctate RLP stone.  No left nephrolithiasis.  Kidneys appear morphologically normal otherwise.  Normal bilateral ureters and bladder.  KUB 02/14/24 - no obvious renal calcifications, read pending    Assessment & Plan:    Nephrolithiasis Assessment & Plan: s/p Right URS (6mm ureteral stone) in Oct 2024   - CaOx 75% mono, 20 di, 5% Phos  CT A/P (Aug 2025) - punctate RLP stone only KUB today - negative  Today we reviewed the basic tenets of kidney stone management and prevention. We reviewed five broad recommendations:   - Adequate fluid intake >=2.5 L/day (goal urine output >=2 L/day). - Maintenance of normal dietary calcium  (1,000-1,200 mg/day), avoid excess calcium  supplements - Reduction of sodium intake (<2 g/day) to lower urinary calcium  excretion. - Moderation of oxalate-rich foods (nuts, spinach, beets, chocolate, tea)  - Limit animal protein (meat, fish, poultry); promote balanced diet with fruits/vegetables.   - At this point, follow up on as needed basis          Penne JONELLE Skye, MD  [facility] [address], [address] [place], [place] [address] [phone]

## 2024-02-12 NOTE — Assessment & Plan Note (Signed)
 s/p Right URS (6mm ureteral stone) in Oct 2024   - CaOx 75% mono, 20 di, 5% Phos  CT A/P (Aug 2025) - punctate RLP stone only KUB today - negative  Today we reviewed the basic tenets of kidney stone management and prevention. We reviewed five broad recommendations:   - Adequate fluid intake >=2.5 L/day (goal urine output >=2 L/day). - Maintenance of normal dietary calcium  (1,000-1,200 mg/day), avoid excess calcium  supplements - Reduction of sodium intake (<2 g/day) to lower urinary calcium  excretion. - Moderation of oxalate-rich foods (nuts, spinach, beets, chocolate, tea)  - Limit animal protein (meat, fish, poultry); promote balanced diet with fruits/vegetables.   - At this point, follow up on as needed basis

## 2024-02-12 NOTE — Telephone Encounter (Signed)
 Pt has an appt on Thursday 12/4 w/Dr. Georganne.  She asked about a KUB prior and he has it in his appt note.  Pt had an MRI a week ago at [facility] and has the written copy.  She wants to know if she would still need the KUB, or could Dr Georganne use this?

## 2024-02-14 ENCOUNTER — Other Ambulatory Visit: Payer: Self-pay

## 2024-02-14 ENCOUNTER — Encounter: Payer: Self-pay | Admitting: Urology

## 2024-02-14 ENCOUNTER — Ambulatory Visit
Admission: RE | Admit: 2024-02-14 | Discharge: 2024-02-14 | Disposition: A | Source: Ambulatory Visit | Attending: Urology | Admitting: Urology

## 2024-02-14 ENCOUNTER — Ambulatory Visit: Admitting: Urology

## 2024-02-14 ENCOUNTER — Ambulatory Visit
Admission: RE | Admit: 2024-02-14 | Discharge: 2024-02-14 | Disposition: A | Discharge status: Home / Self Care | Source: Ambulatory Visit | Attending: Urology | Admitting: Urology

## 2024-02-14 VITALS — BP 132/82 | HR 74 | Ht 67.0 in | Wt 190.0 lb

## 2024-02-14 DIAGNOSIS — N201 Calculus of ureter: Secondary | ICD-10-CM | POA: Diagnosis present

## 2024-02-14 DIAGNOSIS — N200 Calculus of kidney: Secondary | ICD-10-CM

## 2024-02-14 NOTE — Patient Instructions (Signed)
 Today we reviewed the basic tenets of kidney stone management and prevention. We reviewed five broad recommendations:   - Adequate fluid intake >=2.5 L/day (goal urine output >=2 L/day). - Maintenance of normal dietary calcium (1,000-1,200 mg/day), avoid excess calcium supplements - Reduction of sodium intake (<2 g/day) to lower urinary calcium excretion. - Moderation of oxalate-rich foods (nuts, spinach, beets, chocolate, tea)  - Limit animal protein (meat, fish, poultry); promote balanced diet with fruits/vegetables.

## 2024-03-27 ENCOUNTER — Ambulatory Visit: Admitting: Podiatry

## 2024-03-27 ENCOUNTER — Encounter: Payer: Self-pay | Admitting: Podiatry

## 2024-03-27 DIAGNOSIS — B070 Plantar wart: Secondary | ICD-10-CM | POA: Diagnosis not present

## 2024-03-27 NOTE — Progress Notes (Signed)
 Subjective:   Patient ID: Caitlin Moran, female   DOB: 64 y.o.   MRN: [id number]   HPI Patient states started to get pain again on the left foot stepped on some in December and seem like it got worse at that point   ROS      Objective:  Physical Exam  Neuro vascular status intact keratotic lesion subsecond metatarsal left inflammation fluid around the area painful when pressed     Assessment:  Probability for verruca plantaris left may also possibly be some form of inflammatory condition     Plan:  H&P debrided the tissue and apply chemical agent to create immune response with sterile dressing.  Gave instructions on what to do if any blistering were to occur reappoint as symptoms indicate may have to treat capsulitis if symptoms persist in the short-term

## 2024-04-11 ENCOUNTER — Encounter: Payer: Self-pay | Admitting: Podiatry

## 2024-04-11 ENCOUNTER — Ambulatory Visit: Admitting: Podiatry

## 2024-04-11 DIAGNOSIS — G9332 Myalgic encephalomyelitis/chronic fatigue syndrome: Secondary | ICD-10-CM | POA: Insufficient documentation

## 2024-04-11 DIAGNOSIS — Z720 Tobacco use: Secondary | ICD-10-CM | POA: Insufficient documentation

## 2024-04-11 DIAGNOSIS — G8929 Other chronic pain: Secondary | ICD-10-CM | POA: Insufficient documentation

## 2024-04-11 DIAGNOSIS — R6889 Other general symptoms and signs: Secondary | ICD-10-CM | POA: Insufficient documentation

## 2024-04-11 DIAGNOSIS — Z79899 Other long term (current) drug therapy: Secondary | ICD-10-CM | POA: Insufficient documentation

## 2024-04-11 DIAGNOSIS — R0989 Other specified symptoms and signs involving the circulatory and respiratory systems: Secondary | ICD-10-CM | POA: Insufficient documentation

## 2024-04-11 DIAGNOSIS — M1990 Unspecified osteoarthritis, unspecified site: Secondary | ICD-10-CM | POA: Insufficient documentation

## 2024-04-11 DIAGNOSIS — I8393 Asymptomatic varicose veins of bilateral lower extremities: Secondary | ICD-10-CM | POA: Insufficient documentation

## 2024-04-11 DIAGNOSIS — B070 Plantar wart: Secondary | ICD-10-CM | POA: Diagnosis not present

## 2024-04-11 DIAGNOSIS — E559 Vitamin D deficiency, unspecified: Secondary | ICD-10-CM | POA: Insufficient documentation

## 2024-04-11 DIAGNOSIS — I2510 Atherosclerotic heart disease of native coronary artery without angina pectoris: Secondary | ICD-10-CM | POA: Insufficient documentation

## 2024-04-11 DIAGNOSIS — D849 Immunodeficiency, unspecified: Secondary | ICD-10-CM | POA: Insufficient documentation

## 2024-04-11 DIAGNOSIS — F339 Major depressive disorder, recurrent, unspecified: Secondary | ICD-10-CM | POA: Insufficient documentation

## 2024-04-11 DIAGNOSIS — Z9049 Acquired absence of other specified parts of digestive tract: Secondary | ICD-10-CM | POA: Insufficient documentation

## 2024-04-11 DIAGNOSIS — Z6841 Body Mass Index (BMI) 40.0 and over, adult: Secondary | ICD-10-CM | POA: Insufficient documentation

## 2024-04-11 DIAGNOSIS — E7800 Pure hypercholesterolemia, unspecified: Secondary | ICD-10-CM | POA: Insufficient documentation

## 2024-04-11 DIAGNOSIS — D751 Secondary polycythemia: Secondary | ICD-10-CM | POA: Insufficient documentation

## 2024-04-11 DIAGNOSIS — M150 Primary generalized (osteo)arthritis: Secondary | ICD-10-CM | POA: Insufficient documentation

## 2024-04-11 DIAGNOSIS — K760 Fatty (change of) liver, not elsewhere classified: Secondary | ICD-10-CM | POA: Insufficient documentation

## 2024-04-11 NOTE — Progress Notes (Signed)
 Subjective:   Patient ID: Caitlin Moran, female   DOB: 64 y.o.   MRN: [id number]   HPI Patient presents with lesion plantar left stating that it seems to be improved but I still noticed that and I just wanted it checked and see if I need other treatment   ROS      Objective:  Physical Exam  Neurovascular status intact keratotic lesion plantar left upon debridement slight pinpoint bleeding pain to lateral pressure improved from last visit     Assessment:  Verruca plantaris plantar left improved still has some tissue left     Plan:  Sharp sterile debridement of remaining tissue exposed and applied chemical consisting of canthacur under occlusion with sterile dressing and explained what to do if blistering were to occur.  Patient will be seen back as needed this should be the end of problem

## 2024-05-28 ENCOUNTER — Ambulatory Visit: Admitting: Family Medicine

## 2024-06-10 ENCOUNTER — Ambulatory Visit: Admitting: Family Medicine
